# Patient Record
Sex: Female | Born: 1950
Health system: Southern US, Community
[De-identification: ages and names within clinical notes are randomized; demographics above are authoritative.]

## PROBLEM LIST (undated history)

## (undated) DIAGNOSIS — E785 Hyperlipidemia, unspecified: Secondary | ICD-10-CM

## (undated) DIAGNOSIS — D649 Anemia, unspecified: Secondary | ICD-10-CM

## (undated) DIAGNOSIS — E669 Obesity, unspecified: Secondary | ICD-10-CM

## (undated) DIAGNOSIS — D519 Vitamin B12 deficiency anemia, unspecified: Secondary | ICD-10-CM

## (undated) DIAGNOSIS — I639 Cerebral infarction, unspecified: Secondary | ICD-10-CM

## (undated) DIAGNOSIS — F418 Other specified anxiety disorders: Secondary | ICD-10-CM

## (undated) DIAGNOSIS — E0500 Thyrotoxicosis with diffuse goiter without thyrotoxic crisis or storm: Secondary | ICD-10-CM

## (undated) DIAGNOSIS — I10 Essential (primary) hypertension: Secondary | ICD-10-CM

## (undated) DIAGNOSIS — M797 Fibromyalgia: Secondary | ICD-10-CM

## (undated) HISTORY — DX: Fibromyalgia: M79.7

## (undated) HISTORY — DX: Obesity, unspecified: E66.9

## (undated) HISTORY — PX: CHOLECYSTECTOMY: SHX55

## (undated) HISTORY — DX: Vitamin B12 deficiency anemia, unspecified: D51.9

## (undated) HISTORY — DX: Thyrotoxicosis with diffuse goiter without thyrotoxic crisis or storm: E05.00

## (undated) HISTORY — DX: Essential (primary) hypertension: I10

## (undated) HISTORY — DX: Anemia, unspecified: D64.9

## (undated) HISTORY — DX: Hyperlipidemia, unspecified: E78.5

---

## 1979-08-02 HISTORY — PX: GASTRIC BYPASS: SHX52

## 1984-08-01 HISTORY — PX: OTHER SURGICAL HISTORY: SHX169

## 1997-12-25 ENCOUNTER — Encounter: Admission: RE | Admit: 1997-12-25 | Discharge: 1998-03-25 | Payer: Self-pay | Admitting: Family Medicine

## 2000-01-26 ENCOUNTER — Other Ambulatory Visit: Admission: RE | Admit: 2000-01-26 | Discharge: 2000-01-26 | Payer: Self-pay | Admitting: Family Medicine

## 2001-03-28 ENCOUNTER — Inpatient Hospital Stay (HOSPITAL_COMMUNITY): Admission: EM | Admit: 2001-03-28 | Discharge: 2001-03-30 | Payer: Self-pay | Admitting: Emergency Medicine

## 2001-03-28 ENCOUNTER — Encounter: Payer: Self-pay | Admitting: Neurology

## 2001-04-17 ENCOUNTER — Encounter: Admission: RE | Admit: 2001-04-17 | Discharge: 2001-05-01 | Payer: Self-pay | Admitting: Neurology

## 2002-09-09 ENCOUNTER — Encounter: Admission: RE | Admit: 2002-09-09 | Discharge: 2002-12-08 | Payer: Self-pay | Admitting: Family Medicine

## 2003-01-02 ENCOUNTER — Other Ambulatory Visit: Admission: RE | Admit: 2003-01-02 | Discharge: 2003-01-02 | Payer: Self-pay | Admitting: Family Medicine

## 2004-03-10 ENCOUNTER — Encounter (INDEPENDENT_AMBULATORY_CARE_PROVIDER_SITE_OTHER): Payer: Self-pay | Admitting: *Deleted

## 2004-03-10 ENCOUNTER — Ambulatory Visit (HOSPITAL_COMMUNITY): Admission: RE | Admit: 2004-03-10 | Discharge: 2004-03-10 | Payer: Self-pay | Admitting: Gastroenterology

## 2004-04-21 ENCOUNTER — Encounter: Admission: RE | Admit: 2004-04-21 | Discharge: 2004-04-21 | Payer: Self-pay | Admitting: Gastroenterology

## 2004-05-12 ENCOUNTER — Encounter: Admission: RE | Admit: 2004-05-12 | Discharge: 2004-05-12 | Payer: Self-pay | Admitting: Gastroenterology

## 2004-06-02 ENCOUNTER — Encounter (INDEPENDENT_AMBULATORY_CARE_PROVIDER_SITE_OTHER): Payer: Self-pay | Admitting: Specialist

## 2004-06-02 ENCOUNTER — Ambulatory Visit (HOSPITAL_COMMUNITY): Admission: RE | Admit: 2004-06-02 | Discharge: 2004-06-03 | Payer: Self-pay | Admitting: General Surgery

## 2004-06-10 ENCOUNTER — Ambulatory Visit (HOSPITAL_COMMUNITY): Admission: RE | Admit: 2004-06-10 | Discharge: 2004-06-10 | Payer: Self-pay | Admitting: Family Medicine

## 2005-01-12 ENCOUNTER — Ambulatory Visit (HOSPITAL_COMMUNITY): Admission: RE | Admit: 2005-01-12 | Discharge: 2005-01-12 | Payer: Self-pay | Admitting: Gastroenterology

## 2005-01-12 ENCOUNTER — Encounter (INDEPENDENT_AMBULATORY_CARE_PROVIDER_SITE_OTHER): Payer: Self-pay | Admitting: Specialist

## 2005-01-14 ENCOUNTER — Ambulatory Visit (HOSPITAL_COMMUNITY): Admission: RE | Admit: 2005-01-14 | Discharge: 2005-01-14 | Payer: Self-pay | Admitting: Gastroenterology

## 2005-06-01 ENCOUNTER — Encounter: Admission: RE | Admit: 2005-06-01 | Discharge: 2005-06-01 | Payer: Self-pay | Admitting: Orthopedic Surgery

## 2005-08-01 HISTORY — PX: ABDOMINAL HYSTERECTOMY: SHX81

## 2005-08-17 ENCOUNTER — Ambulatory Visit: Payer: Self-pay | Admitting: Oncology

## 2005-10-03 ENCOUNTER — Ambulatory Visit: Payer: Self-pay | Admitting: Oncology

## 2005-11-21 ENCOUNTER — Ambulatory Visit: Payer: Self-pay | Admitting: Oncology

## 2005-11-21 LAB — CBC WITH DIFFERENTIAL/PLATELET
BASO%: 0.3 % (ref 0.0–2.0)
Basophils Absolute: 0 10*3/uL (ref 0.0–0.1)
EOS%: 1 % (ref 0.0–7.0)
Eosinophils Absolute: 0 10*3/uL (ref 0.0–0.5)
HCT: 30 % — ABNORMAL LOW (ref 34.8–46.6)
HGB: 10.1 g/dL — ABNORMAL LOW (ref 11.6–15.9)
LYMPH%: 25 % (ref 14.0–48.0)
MCH: 30.3 pg (ref 26.0–34.0)
MCHC: 33.8 g/dL (ref 32.0–36.0)
MCV: 89.7 fL (ref 81.0–101.0)
MONO#: 0.5 10*3/uL (ref 0.1–0.9)
MONO%: 11 % (ref 0.0–13.0)
NEUT#: 2.7 10*3/uL (ref 1.5–6.5)
NEUT%: 62.7 % (ref 39.6–76.8)
Platelets: 300 10*3/uL (ref 145–400)
RBC: 3.35 10*6/uL — ABNORMAL LOW (ref 3.70–5.32)
RDW: 15.5 % — ABNORMAL HIGH (ref 11.3–14.5)
WBC: 4.2 10*3/uL (ref 3.9–10.0)
lymph#: 1.1 10*3/uL (ref 0.9–3.3)

## 2005-11-24 LAB — HEMOGLOBINOPATHY EVALUATION
Hemoglobin A2.: 3.4 % (ref 1.5–3.7)
Hgb A: 96.6 % (ref 94.3–98.5)
Hgb C: 0 % (ref 0.0–0.0)
Hgb E Quant: 0 % (ref 0.0–0.0)
Hgb F Quant: 0 % (ref 0.0–2.0)
Hgb Other: 0 % (ref 0.0–0.0)
Hgb S Quant: 0 % (ref 0.0–0.0)

## 2005-11-24 LAB — IRON AND TIBC
%SAT: 16 % — ABNORMAL LOW (ref 20–55)
Iron: 44 ug/dL (ref 42–145)
TIBC: 268 ug/dL (ref 250–470)
UIBC: 224 ug/dL

## 2005-11-24 LAB — COMPREHENSIVE METABOLIC PANEL
ALT: 10 U/L (ref 0–40)
AST: 16 U/L (ref 0–37)
Albumin: 3.7 g/dL (ref 3.5–5.2)
Alkaline Phosphatase: 43 U/L (ref 39–117)
BUN: 13 mg/dL (ref 6–23)
CO2: 27 mEq/L (ref 19–32)
Calcium: 8.8 mg/dL (ref 8.4–10.5)
Chloride: 106 mEq/L (ref 96–112)
Creatinine, Ser: 1 mg/dL (ref 0.4–1.2)
Glucose, Bld: 119 mg/dL — ABNORMAL HIGH (ref 70–99)
Potassium: 4.1 mEq/L (ref 3.5–5.3)
Sodium: 136 mEq/L (ref 135–145)
Total Bilirubin: 0.3 mg/dL (ref 0.3–1.2)
Total Protein: 6.7 g/dL (ref 6.0–8.3)

## 2005-11-24 LAB — VITAMIN B12: Vitamin B-12: 265 pg/mL (ref 211–911)

## 2005-11-24 LAB — FERRITIN: Ferritin: 200 ng/mL (ref 10–291)

## 2005-12-28 ENCOUNTER — Ambulatory Visit: Payer: Self-pay | Admitting: Oncology

## 2006-01-03 LAB — CBC WITH DIFFERENTIAL/PLATELET
BASO%: 0.4 % (ref 0.0–2.0)
Basophils Absolute: 0 10*3/uL (ref 0.0–0.1)
EOS%: 1 % (ref 0.0–7.0)
Eosinophils Absolute: 0 10*3/uL (ref 0.0–0.5)
HCT: 29.2 % — ABNORMAL LOW (ref 34.8–46.6)
HGB: 10 g/dL — ABNORMAL LOW (ref 11.6–15.9)
LYMPH%: 24.2 % (ref 14.0–48.0)
MCH: 30.7 pg (ref 26.0–34.0)
MCHC: 34.3 g/dL (ref 32.0–36.0)
MCV: 89.6 fL (ref 81.0–101.0)
MONO#: 0.4 10*3/uL (ref 0.1–0.9)
MONO%: 8.6 % (ref 0.0–13.0)
NEUT#: 2.9 10*3/uL (ref 1.5–6.5)
NEUT%: 65.8 % (ref 39.6–76.8)
Platelets: 306 10*3/uL (ref 145–400)
RBC: 3.26 10*6/uL — ABNORMAL LOW (ref 3.70–5.32)
RDW: 14.2 % (ref 11.3–14.5)
WBC: 4.5 10*3/uL (ref 3.9–10.0)
lymph#: 1.1 10*3/uL (ref 0.9–3.3)

## 2006-01-03 LAB — RETICULOCYTES
IRF: 0.35 — ABNORMAL HIGH (ref 0.130–0.330)
RETIC #: 37 10*3/uL (ref 19.7–115.1)
Retic %: 1.1 % (ref 0.4–2.3)

## 2006-01-03 LAB — CHCC SMEAR

## 2006-01-04 LAB — COMPREHENSIVE METABOLIC PANEL
ALT: 9 U/L (ref 0–40)
AST: 13 U/L (ref 0–37)
Albumin: 3.6 g/dL (ref 3.5–5.2)
Alkaline Phosphatase: 42 U/L (ref 39–117)
BUN: 15 mg/dL (ref 6–23)
CO2: 29 mEq/L (ref 19–32)
Calcium: 9 mg/dL (ref 8.4–10.5)
Chloride: 103 mEq/L (ref 96–112)
Creatinine, Ser: 0.89 mg/dL (ref 0.40–1.20)
Glucose, Bld: 149 mg/dL — ABNORMAL HIGH (ref 70–99)
Potassium: 3.7 mEq/L (ref 3.5–5.3)
Sodium: 137 mEq/L (ref 135–145)
Total Bilirubin: 0.3 mg/dL (ref 0.3–1.2)
Total Protein: 6.6 g/dL (ref 6.0–8.3)

## 2006-01-04 LAB — IRON AND TIBC
%SAT: 13 % — ABNORMAL LOW (ref 20–55)
Iron: 31 ug/dL — ABNORMAL LOW (ref 42–145)
TIBC: 238 ug/dL — ABNORMAL LOW (ref 250–470)
UIBC: 207 ug/dL

## 2006-01-04 LAB — ANA: Anti Nuclear Antibody(ANA): NEGATIVE

## 2006-01-04 LAB — FERRITIN: Ferritin: 187 ng/mL (ref 10–291)

## 2006-01-04 LAB — VITAMIN B12: Vitamin B-12: 322 pg/mL (ref 211–911)

## 2006-01-04 LAB — LACTATE DEHYDROGENASE: LDH: 141 U/L (ref 94–250)

## 2006-01-04 LAB — SEDIMENTATION RATE: Sed Rate: 15 mm/hr (ref 0–22)

## 2006-01-17 LAB — CBC WITH DIFFERENTIAL/PLATELET
BASO%: 0.4 % (ref 0.0–2.0)
Basophils Absolute: 0 10*3/uL (ref 0.0–0.1)
EOS%: 1.7 % (ref 0.0–7.0)
Eosinophils Absolute: 0.1 10*3/uL (ref 0.0–0.5)
HCT: 31.4 % — ABNORMAL LOW (ref 34.8–46.6)
HGB: 10.6 g/dL — ABNORMAL LOW (ref 11.6–15.9)
LYMPH%: 31.3 % (ref 14.0–48.0)
MCH: 30.3 pg (ref 26.0–34.0)
MCHC: 33.7 g/dL (ref 32.0–36.0)
MCV: 89.8 fL (ref 81.0–101.0)
MONO#: 0.3 10*3/uL (ref 0.1–0.9)
MONO%: 11 % (ref 0.0–13.0)
NEUT#: 1.7 10*3/uL (ref 1.5–6.5)
NEUT%: 55.6 % (ref 39.6–76.8)
Platelets: 312 10*3/uL (ref 145–400)
RBC: 3.5 10*6/uL — ABNORMAL LOW (ref 3.70–5.32)
RDW: 15 % — ABNORMAL HIGH (ref 11.3–14.5)
WBC: 3 10*3/uL — ABNORMAL LOW (ref 3.9–10.0)
lymph#: 0.9 10*3/uL (ref 0.9–3.3)

## 2006-01-31 LAB — CBC WITH DIFFERENTIAL/PLATELET
BASO%: 0.3 % (ref 0.0–2.0)
Basophils Absolute: 0 10*3/uL (ref 0.0–0.1)
EOS%: 0.9 % (ref 0.0–7.0)
Eosinophils Absolute: 0 10*3/uL (ref 0.0–0.5)
HCT: 37.8 % (ref 34.8–46.6)
HGB: 12.5 g/dL (ref 11.6–15.9)
LYMPH%: 26.9 % (ref 14.0–48.0)
MCH: 30.2 pg (ref 26.0–34.0)
MCHC: 33 g/dL (ref 32.0–36.0)
MCV: 91.4 fL (ref 81.0–101.0)
MONO#: 0.3 10*3/uL (ref 0.1–0.9)
MONO%: 9.4 % (ref 0.0–13.0)
NEUT#: 2.3 10*3/uL (ref 1.5–6.5)
NEUT%: 62.5 % (ref 39.6–76.8)
Platelets: 346 10*3/uL (ref 145–400)
RBC: 4.13 10*6/uL (ref 3.70–5.32)
RDW: 15.9 % — ABNORMAL HIGH (ref 11.3–14.5)
WBC: 3.6 10*3/uL — ABNORMAL LOW (ref 3.9–10.0)
lymph#: 1 10*3/uL (ref 0.9–3.3)

## 2006-02-13 ENCOUNTER — Ambulatory Visit: Payer: Self-pay | Admitting: Oncology

## 2006-02-13 LAB — CBC WITH DIFFERENTIAL/PLATELET
BASO%: 0.3 % (ref 0.0–2.0)
Basophils Absolute: 0 10*3/uL (ref 0.0–0.1)
EOS%: 1 % (ref 0.0–7.0)
Eosinophils Absolute: 0 10*3/uL (ref 0.0–0.5)
HCT: 34.5 % — ABNORMAL LOW (ref 34.8–46.6)
HGB: 11.8 g/dL (ref 11.6–15.9)
LYMPH%: 25.7 % (ref 14.0–48.0)
MCH: 30.6 pg (ref 26.0–34.0)
MCHC: 34 g/dL (ref 32.0–36.0)
MCV: 90 fL (ref 81.0–101.0)
MONO#: 0.4 10*3/uL (ref 0.1–0.9)
MONO%: 8.3 % (ref 0.0–13.0)
NEUT#: 3.1 10*3/uL (ref 1.5–6.5)
NEUT%: 64.7 % (ref 39.6–76.8)
Platelets: 324 10*3/uL (ref 145–400)
RBC: 3.84 10*6/uL (ref 3.70–5.32)
RDW: 14.6 % — ABNORMAL HIGH (ref 11.3–14.5)
WBC: 4.8 10*3/uL (ref 3.9–10.0)
lymph#: 1.2 10*3/uL (ref 0.9–3.3)

## 2006-02-13 LAB — IRON AND TIBC
%SAT: 32 % (ref 20–55)
Iron: 80 ug/dL (ref 42–145)
TIBC: 247 ug/dL — ABNORMAL LOW (ref 250–470)
UIBC: 167 ug/dL

## 2006-02-13 LAB — FERRITIN: Ferritin: 186 ng/mL (ref 10–291)

## 2006-02-28 LAB — CBC WITH DIFFERENTIAL/PLATELET
BASO%: 0.3 % (ref 0.0–2.0)
Basophils Absolute: 0 10*3/uL (ref 0.0–0.1)
EOS%: 1.1 % (ref 0.0–7.0)
Eosinophils Absolute: 0.1 10*3/uL (ref 0.0–0.5)
HCT: 35.5 % (ref 34.8–46.6)
HGB: 11.9 g/dL (ref 11.6–15.9)
LYMPH%: 16.5 % (ref 14.0–48.0)
MCH: 29.3 pg (ref 26.0–34.0)
MCHC: 33.4 g/dL (ref 32.0–36.0)
MCV: 87.7 fL (ref 81.0–101.0)
MONO#: 0.4 10*3/uL (ref 0.1–0.9)
MONO%: 7.4 % (ref 0.0–13.0)
NEUT#: 3.9 10*3/uL (ref 1.5–6.5)
NEUT%: 74.7 % (ref 39.6–76.8)
Platelets: 459 10*3/uL — ABNORMAL HIGH (ref 145–400)
RBC: 4.05 10*6/uL (ref 3.70–5.32)
RDW: 14.6 % — ABNORMAL HIGH (ref 11.3–14.5)
WBC: 5.2 10*3/uL (ref 3.9–10.0)
lymph#: 0.9 10*3/uL (ref 0.9–3.3)

## 2006-03-13 LAB — CBC WITH DIFFERENTIAL/PLATELET
BASO%: 0.3 % (ref 0.0–2.0)
Basophils Absolute: 0 10*3/uL (ref 0.0–0.1)
EOS%: 1.4 % (ref 0.0–7.0)
Eosinophils Absolute: 0.1 10*3/uL (ref 0.0–0.5)
HCT: 38 % (ref 34.8–46.6)
HGB: 12.5 g/dL (ref 11.6–15.9)
LYMPH%: 20.8 % (ref 14.0–48.0)
MCH: 29.1 pg (ref 26.0–34.0)
MCHC: 33 g/dL (ref 32.0–36.0)
MCV: 88.2 fL (ref 81.0–101.0)
MONO#: 0.3 10*3/uL (ref 0.1–0.9)
MONO%: 7.6 % (ref 0.0–13.0)
NEUT#: 3 10*3/uL (ref 1.5–6.5)
NEUT%: 69.9 % (ref 39.6–76.8)
Platelets: 443 10*3/uL — ABNORMAL HIGH (ref 145–400)
RBC: 4.3 10*6/uL (ref 3.70–5.32)
RDW: 15.7 % — ABNORMAL HIGH (ref 11.3–14.5)
WBC: 4.2 10*3/uL (ref 3.9–10.0)
lymph#: 0.9 10*3/uL (ref 0.9–3.3)

## 2006-03-13 LAB — IRON AND TIBC
%SAT: 18 % — ABNORMAL LOW (ref 20–55)
Iron: 47 ug/dL (ref 42–145)
TIBC: 267 ug/dL (ref 250–470)
UIBC: 220 ug/dL

## 2006-03-13 LAB — FERRITIN: Ferritin: 149 ng/mL (ref 10–291)

## 2006-03-28 LAB — CBC WITH DIFFERENTIAL/PLATELET
BASO%: 0.5 % (ref 0.0–2.0)
Basophils Absolute: 0 10*3/uL (ref 0.0–0.1)
EOS%: 1.5 % (ref 0.0–7.0)
Eosinophils Absolute: 0.1 10*3/uL (ref 0.0–0.5)
HCT: 36.4 % (ref 34.8–46.6)
HGB: 12.2 g/dL (ref 11.6–15.9)
LYMPH%: 23.6 % (ref 14.0–48.0)
MCH: 28.9 pg (ref 26.0–34.0)
MCHC: 33.6 g/dL (ref 32.0–36.0)
MCV: 86.2 fL (ref 81.0–101.0)
MONO#: 0.4 10*3/uL (ref 0.1–0.9)
MONO%: 8.1 % (ref 0.0–13.0)
NEUT#: 3.1 10*3/uL (ref 1.5–6.5)
NEUT%: 66.3 % (ref 39.6–76.8)
Platelets: 288 10*3/uL (ref 145–400)
RBC: 4.22 10*6/uL (ref 3.70–5.32)
RDW: 15.1 % — ABNORMAL HIGH (ref 11.3–14.5)
WBC: 4.6 10*3/uL (ref 3.9–10.0)
lymph#: 1.1 10*3/uL (ref 0.9–3.3)

## 2006-03-28 LAB — IRON AND TIBC
%SAT: 21 % (ref 20–55)
Iron: 59 ug/dL (ref 42–145)
TIBC: 280 ug/dL (ref 250–470)
UIBC: 221 ug/dL

## 2006-03-28 LAB — FERRITIN: Ferritin: 154 ng/mL (ref 10–291)

## 2006-04-20 ENCOUNTER — Ambulatory Visit: Payer: Self-pay | Admitting: Oncology

## 2006-04-24 LAB — CBC WITH DIFFERENTIAL/PLATELET
BASO%: 0.6 % (ref 0.0–2.0)
Basophils Absolute: 0 10*3/uL (ref 0.0–0.1)
EOS%: 2.9 % (ref 0.0–7.0)
Eosinophils Absolute: 0.1 10*3/uL (ref 0.0–0.5)
HCT: 33.9 % — ABNORMAL LOW (ref 34.8–46.6)
HGB: 11.3 g/dL — ABNORMAL LOW (ref 11.6–15.9)
LYMPH%: 35.7 % (ref 14.0–48.0)
MCH: 28.4 pg (ref 26.0–34.0)
MCHC: 33.3 g/dL (ref 32.0–36.0)
MCV: 85.2 fL (ref 81.0–101.0)
MONO#: 0.4 10*3/uL (ref 0.1–0.9)
MONO%: 10 % (ref 0.0–13.0)
NEUT#: 2.2 10*3/uL (ref 1.5–6.5)
NEUT%: 50.8 % (ref 39.6–76.8)
Platelets: 307 10*3/uL (ref 145–400)
RBC: 3.97 10*6/uL (ref 3.70–5.32)
RDW: 15.7 % — ABNORMAL HIGH (ref 11.3–14.5)
WBC: 4.3 10*3/uL (ref 3.9–10.0)
lymph#: 1.5 10*3/uL (ref 0.9–3.3)

## 2006-05-02 ENCOUNTER — Encounter: Admission: RE | Admit: 2006-05-02 | Discharge: 2006-05-02 | Payer: Self-pay | Admitting: Family Medicine

## 2006-06-15 ENCOUNTER — Ambulatory Visit: Payer: Self-pay | Admitting: Oncology

## 2006-06-19 LAB — CBC WITH DIFFERENTIAL/PLATELET
BASO%: 0.7 % (ref 0.0–2.0)
Basophils Absolute: 0 10*3/uL (ref 0.0–0.1)
EOS%: 1.2 % (ref 0.0–7.0)
Eosinophils Absolute: 0 10*3/uL (ref 0.0–0.5)
HCT: 35.3 % (ref 34.8–46.6)
HGB: 11.5 g/dL — ABNORMAL LOW (ref 11.6–15.9)
LYMPH%: 29.6 % (ref 14.0–48.0)
MCH: 28.4 pg (ref 26.0–34.0)
MCHC: 32.6 g/dL (ref 32.0–36.0)
MCV: 87.1 fL (ref 81.0–101.0)
MONO#: 0.4 10*3/uL (ref 0.1–0.9)
MONO%: 11 % (ref 0.0–13.0)
NEUT#: 2.2 10*3/uL (ref 1.5–6.5)
NEUT%: 57.5 % (ref 39.6–76.8)
Platelets: 277 10*3/uL (ref 145–400)
RBC: 4.06 10*6/uL (ref 3.70–5.32)
RDW: 17.2 % — ABNORMAL HIGH (ref 11.3–14.5)
WBC: 3.8 10*3/uL — ABNORMAL LOW (ref 3.9–10.0)
lymph#: 1.1 10*3/uL (ref 0.9–3.3)

## 2006-06-19 LAB — IRON AND TIBC
%SAT: 15 % — ABNORMAL LOW (ref 20–55)
Iron: 44 ug/dL (ref 42–145)
TIBC: 293 ug/dL (ref 250–470)
UIBC: 249 ug/dL

## 2006-06-19 LAB — COMPREHENSIVE METABOLIC PANEL
ALT: <8 U/L (ref 0–35)
AST: 12 U/L (ref 0–37)
Albumin: 4 g/dL (ref 3.5–5.2)
Alkaline Phosphatase: 42 U/L (ref 39–117)
BUN: 15 mg/dL (ref 6–23)
CO2: 26 mEq/L (ref 19–32)
Calcium: 9 mg/dL (ref 8.4–10.5)
Chloride: 105 mEq/L (ref 96–112)
Creatinine, Ser: 0.97 mg/dL (ref 0.40–1.20)
Glucose, Bld: 134 mg/dL — ABNORMAL HIGH (ref 70–99)
Potassium: 4.2 mEq/L (ref 3.5–5.3)
Sodium: 139 mEq/L (ref 135–145)
Total Bilirubin: 0.3 mg/dL (ref 0.3–1.2)
Total Protein: 7.3 g/dL (ref 6.0–8.3)

## 2006-06-19 LAB — LACTATE DEHYDROGENASE: LDH: 145 U/L (ref 94–250)

## 2006-06-19 LAB — FERRITIN: Ferritin: 127 ng/mL (ref 10–291)

## 2006-07-17 LAB — CBC WITH DIFFERENTIAL/PLATELET
BASO%: 0.6 % (ref 0.0–2.0)
Basophils Absolute: 0 10*3/uL (ref 0.0–0.1)
EOS%: 0.8 % (ref 0.0–7.0)
Eosinophils Absolute: 0 10*3/uL (ref 0.0–0.5)
HCT: 38.4 % (ref 34.8–46.6)
HGB: 12.7 g/dL (ref 11.6–15.9)
LYMPH%: 27.2 % (ref 14.0–48.0)
MCH: 28.8 pg (ref 26.0–34.0)
MCHC: 33 g/dL (ref 32.0–36.0)
MCV: 87.1 fL (ref 81.0–101.0)
MONO#: 0.5 10*3/uL (ref 0.1–0.9)
MONO%: 10.4 % (ref 0.0–13.0)
NEUT#: 2.8 10*3/uL (ref 1.5–6.5)
NEUT%: 61 % (ref 39.6–76.8)
Platelets: 300 10*3/uL (ref 145–400)
RBC: 4.41 10*6/uL (ref 3.70–5.32)
RDW: 16.6 % — ABNORMAL HIGH (ref 11.3–14.5)
WBC: 4.6 10*3/uL (ref 3.9–10.0)
lymph#: 1.2 10*3/uL (ref 0.9–3.3)

## 2006-08-09 ENCOUNTER — Ambulatory Visit: Payer: Self-pay | Admitting: Oncology

## 2006-08-14 LAB — CBC WITH DIFFERENTIAL/PLATELET
BASO%: 0.3 % (ref 0.0–2.0)
Basophils Absolute: 0 10*3/uL (ref 0.0–0.1)
EOS%: 1.3 % (ref 0.0–7.0)
Eosinophils Absolute: 0.1 10*3/uL (ref 0.0–0.5)
HCT: 33 % — ABNORMAL LOW (ref 34.8–46.6)
HGB: 11.1 g/dL — ABNORMAL LOW (ref 11.6–15.9)
LYMPH%: 25.7 % (ref 14.0–48.0)
MCH: 28.7 pg (ref 26.0–34.0)
MCHC: 33.6 g/dL (ref 32.0–36.0)
MCV: 85.5 fL (ref 81.0–101.0)
MONO#: 0.5 10*3/uL (ref 0.1–0.9)
MONO%: 9.7 % (ref 0.0–13.0)
NEUT#: 3.3 10*3/uL (ref 1.5–6.5)
NEUT%: 63 % (ref 39.6–76.8)
Platelets: 237 10*3/uL (ref 145–400)
RBC: 3.86 10*6/uL (ref 3.70–5.32)
RDW: 15.5 % — ABNORMAL HIGH (ref 11.3–14.5)
WBC: 5.2 10*3/uL (ref 3.9–10.0)
lymph#: 1.3 10*3/uL (ref 0.9–3.3)

## 2006-09-11 LAB — CBC WITH DIFFERENTIAL/PLATELET
BASO%: 0.5 % (ref 0.0–2.0)
Basophils Absolute: 0 10*3/uL (ref 0.0–0.1)
EOS%: 1.5 % (ref 0.0–7.0)
Eosinophils Absolute: 0.1 10*3/uL (ref 0.0–0.5)
HCT: 33.6 % — ABNORMAL LOW (ref 34.8–46.6)
HGB: 11.4 g/dL — ABNORMAL LOW (ref 11.6–15.9)
LYMPH%: 31.4 % (ref 14.0–48.0)
MCH: 29.2 pg (ref 26.0–34.0)
MCHC: 33.9 g/dL (ref 32.0–36.0)
MCV: 86.1 fL (ref 81.0–101.0)
MONO#: 0.5 10*3/uL (ref 0.1–0.9)
MONO%: 11.5 % (ref 0.0–13.0)
NEUT#: 2.2 10*3/uL (ref 1.5–6.5)
NEUT%: 55.1 % (ref 39.6–76.8)
Platelets: 272 10*3/uL (ref 145–400)
RBC: 3.9 10*6/uL (ref 3.70–5.32)
RDW: 16.8 % — ABNORMAL HIGH (ref 11.3–14.5)
WBC: 4 10*3/uL (ref 3.9–10.0)
lymph#: 1.3 10*3/uL (ref 0.9–3.3)

## 2006-09-19 ENCOUNTER — Ambulatory Visit: Payer: Self-pay | Admitting: Oncology

## 2006-09-21 LAB — COMPREHENSIVE METABOLIC PANEL
ALT: 18 U/L (ref 0–35)
AST: 20 U/L (ref 0–37)
Albumin: 3.8 g/dL (ref 3.5–5.2)
Alkaline Phosphatase: 78 U/L (ref 39–117)
BUN: 10 mg/dL (ref 6–23)
CO2: 29 mEq/L (ref 19–32)
Calcium: 9.4 mg/dL (ref 8.4–10.5)
Chloride: 102 mEq/L (ref 96–112)
Creatinine, Ser: 0.88 mg/dL (ref 0.40–1.20)
Glucose, Bld: 196 mg/dL — ABNORMAL HIGH (ref 70–99)
Potassium: 3.8 mEq/L (ref 3.5–5.3)
Sodium: 140 mEq/L (ref 135–145)
Total Bilirubin: 0.5 mg/dL (ref 0.3–1.2)
Total Protein: 7.2 g/dL (ref 6.0–8.3)

## 2006-09-21 LAB — CBC WITH DIFFERENTIAL/PLATELET
BASO%: 0.4 % (ref 0.0–2.0)
Basophils Absolute: 0 10*3/uL (ref 0.0–0.1)
EOS%: 1.9 % (ref 0.0–7.0)
Eosinophils Absolute: 0.1 10*3/uL (ref 0.0–0.5)
HCT: 37.9 % (ref 34.8–46.6)
HGB: 12.6 g/dL (ref 11.6–15.9)
LYMPH%: 29.7 % (ref 14.0–48.0)
MCH: 28.7 pg (ref 26.0–34.0)
MCHC: 33.3 g/dL (ref 32.0–36.0)
MCV: 86.1 fL (ref 81.0–101.0)
MONO#: 0.4 10*3/uL (ref 0.1–0.9)
MONO%: 8 % (ref 0.0–13.0)
NEUT#: 2.7 10*3/uL (ref 1.5–6.5)
NEUT%: 60 % (ref 39.6–76.8)
Platelets: 336 10*3/uL (ref 145–400)
RBC: 4.41 10*6/uL (ref 3.70–5.32)
RDW: 17.6 % — ABNORMAL HIGH (ref 11.3–14.5)
WBC: 4.5 10*3/uL (ref 3.9–10.0)
lymph#: 1.3 10*3/uL (ref 0.9–3.3)

## 2006-09-21 LAB — FERRITIN: Ferritin: 70 ng/mL (ref 10–291)

## 2006-09-21 LAB — IRON AND TIBC
%SAT: 19 % — ABNORMAL LOW (ref 20–55)
Iron: 51 ug/dL (ref 42–145)
TIBC: 262 ug/dL (ref 250–470)
UIBC: 211 ug/dL

## 2006-09-21 LAB — LACTATE DEHYDROGENASE: LDH: 156 U/L (ref 94–250)

## 2006-09-25 ENCOUNTER — Encounter: Admission: RE | Admit: 2006-09-25 | Discharge: 2006-09-25 | Payer: Self-pay | Admitting: Gastroenterology

## 2006-10-09 LAB — CBC WITH DIFFERENTIAL/PLATELET
BASO%: 0.6 % (ref 0.0–2.0)
Basophils Absolute: 0 10*3/uL (ref 0.0–0.1)
EOS%: 2.3 % (ref 0.0–7.0)
Eosinophils Absolute: 0.1 10*3/uL (ref 0.0–0.5)
HCT: 36.8 % (ref 34.8–46.6)
HGB: 12.4 g/dL (ref 11.6–15.9)
LYMPH%: 33.7 % (ref 14.0–48.0)
MCH: 29.1 pg (ref 26.0–34.0)
MCHC: 33.7 g/dL (ref 32.0–36.0)
MCV: 86.3 fL (ref 81.0–101.0)
MONO#: 0.4 10*3/uL (ref 0.1–0.9)
MONO%: 9.4 % (ref 0.0–13.0)
NEUT#: 2.2 10*3/uL (ref 1.5–6.5)
NEUT%: 54 % (ref 39.6–76.8)
Platelets: 274 10*3/uL (ref 145–400)
RBC: 4.27 10*6/uL (ref 3.70–5.32)
RDW: 16 % — ABNORMAL HIGH (ref 11.3–14.5)
WBC: 4.2 10*3/uL (ref 3.9–10.0)
lymph#: 1.4 10*3/uL (ref 0.9–3.3)

## 2006-11-02 ENCOUNTER — Ambulatory Visit: Payer: Self-pay | Admitting: Oncology

## 2006-11-06 LAB — CBC WITH DIFFERENTIAL/PLATELET
BASO%: 0.6 % (ref 0.0–2.0)
Basophils Absolute: 0 10*3/uL (ref 0.0–0.1)
EOS%: 2.5 % (ref 0.0–7.0)
Eosinophils Absolute: 0.1 10*3/uL (ref 0.0–0.5)
HCT: 33.6 % — ABNORMAL LOW (ref 34.8–46.6)
HGB: 11.3 g/dL — ABNORMAL LOW (ref 11.6–15.9)
LYMPH%: 33.6 % (ref 14.0–48.0)
MCH: 28.8 pg (ref 26.0–34.0)
MCHC: 33.7 g/dL (ref 32.0–36.0)
MCV: 85.5 fL (ref 81.0–101.0)
MONO#: 0.4 10*3/uL (ref 0.1–0.9)
MONO%: 8.5 % (ref 0.0–13.0)
NEUT#: 2.4 10*3/uL (ref 1.5–6.5)
NEUT%: 54.8 % (ref 39.6–76.8)
Platelets: 259 10*3/uL (ref 145–400)
RBC: 3.93 10*6/uL (ref 3.70–5.32)
RDW: 15.4 % — ABNORMAL HIGH (ref 11.3–14.5)
WBC: 4.4 10*3/uL (ref 3.9–10.0)
lymph#: 1.5 10*3/uL (ref 0.9–3.3)

## 2006-11-13 ENCOUNTER — Encounter (INDEPENDENT_AMBULATORY_CARE_PROVIDER_SITE_OTHER): Payer: Self-pay | Admitting: *Deleted

## 2006-11-13 ENCOUNTER — Ambulatory Visit (HOSPITAL_COMMUNITY): Admission: RE | Admit: 2006-11-13 | Discharge: 2006-11-13 | Payer: Self-pay | Admitting: Gastroenterology

## 2006-12-04 LAB — CBC WITH DIFFERENTIAL/PLATELET
BASO%: 0.1 % (ref 0.0–2.0)
Basophils Absolute: 0 10*3/uL (ref 0.0–0.1)
EOS%: 2.3 % (ref 0.0–7.0)
Eosinophils Absolute: 0.1 10*3/uL (ref 0.0–0.5)
HCT: 33.5 % — ABNORMAL LOW (ref 34.8–46.6)
HGB: 11.5 g/dL — ABNORMAL LOW (ref 11.6–15.9)
LYMPH%: 27.3 % (ref 14.0–48.0)
MCH: 29.1 pg (ref 26.0–34.0)
MCHC: 34.3 g/dL (ref 32.0–36.0)
MCV: 84.7 fL (ref 81.0–101.0)
MONO#: 0.4 10*3/uL (ref 0.1–0.9)
MONO%: 10 % (ref 0.0–13.0)
NEUT#: 2.7 10*3/uL (ref 1.5–6.5)
NEUT%: 60.3 % (ref 39.6–76.8)
Platelets: 259 10*3/uL (ref 145–400)
RBC: 3.95 10*6/uL (ref 3.70–5.32)
RDW: 15.5 % — ABNORMAL HIGH (ref 11.3–14.5)
WBC: 4.5 10*3/uL (ref 3.9–10.0)
lymph#: 1.2 10*3/uL (ref 0.9–3.3)

## 2006-12-28 ENCOUNTER — Ambulatory Visit: Payer: Self-pay | Admitting: Oncology

## 2007-01-01 LAB — CBC WITH DIFFERENTIAL/PLATELET
BASO%: 0.7 % (ref 0.0–2.0)
Basophils Absolute: 0 10*3/uL (ref 0.0–0.1)
EOS%: 1.3 % (ref 0.0–7.0)
Eosinophils Absolute: 0.1 10*3/uL (ref 0.0–0.5)
HCT: 35.2 % (ref 34.8–46.6)
HGB: 12 g/dL (ref 11.6–15.9)
LYMPH%: 26.5 % (ref 14.0–48.0)
MCH: 28.7 pg (ref 26.0–34.0)
MCHC: 34.1 g/dL (ref 32.0–36.0)
MCV: 84.1 fL (ref 81.0–101.0)
MONO#: 0.4 10*3/uL (ref 0.1–0.9)
MONO%: 8.6 % (ref 0.0–13.0)
NEUT#: 3.3 10*3/uL (ref 1.5–6.5)
NEUT%: 62.9 % (ref 39.6–76.8)
Platelets: 294 10*3/uL (ref 145–400)
RBC: 4.19 10*6/uL (ref 3.70–5.32)
RDW: 16 % — ABNORMAL HIGH (ref 11.3–14.5)
WBC: 5.2 10*3/uL (ref 3.9–10.0)
lymph#: 1.4 10*3/uL (ref 0.9–3.3)

## 2007-01-23 LAB — COMPREHENSIVE METABOLIC PANEL
ALT: 9 U/L (ref 0–35)
AST: 14 U/L (ref 0–37)
Albumin: 3.6 g/dL (ref 3.5–5.2)
Alkaline Phosphatase: 62 U/L (ref 39–117)
BUN: 16 mg/dL (ref 6–23)
CO2: 26 mEq/L (ref 19–32)
Calcium: 9.4 mg/dL (ref 8.4–10.5)
Chloride: 106 mEq/L (ref 96–112)
Creatinine, Ser: 0.85 mg/dL (ref 0.40–1.20)
Glucose, Bld: 121 mg/dL — ABNORMAL HIGH (ref 70–99)
Potassium: 4 mEq/L (ref 3.5–5.3)
Sodium: 140 mEq/L (ref 135–145)
Total Bilirubin: 0.7 mg/dL (ref 0.3–1.2)
Total Protein: 6.7 g/dL (ref 6.0–8.3)

## 2007-01-23 LAB — CBC WITH DIFFERENTIAL/PLATELET
BASO%: 0.5 % (ref 0.0–2.0)
Basophils Absolute: 0 10*3/uL (ref 0.0–0.1)
EOS%: 1.6 % (ref 0.0–7.0)
Eosinophils Absolute: 0.1 10*3/uL (ref 0.0–0.5)
HCT: 36.2 % (ref 34.8–46.6)
HGB: 12.3 g/dL (ref 11.6–15.9)
LYMPH%: 25.7 % (ref 14.0–48.0)
MCH: 28.8 pg (ref 26.0–34.0)
MCHC: 33.9 g/dL (ref 32.0–36.0)
MCV: 85.1 fL (ref 81.0–101.0)
MONO#: 0.3 10*3/uL (ref 0.1–0.9)
MONO%: 8.3 % (ref 0.0–13.0)
NEUT#: 2.4 10*3/uL (ref 1.5–6.5)
NEUT%: 63.9 % (ref 39.6–76.8)
Platelets: 233 10*3/uL (ref 145–400)
RBC: 4.26 10*6/uL (ref 3.70–5.32)
RDW: 17.3 % — ABNORMAL HIGH (ref 11.3–14.5)
WBC: 3.7 10*3/uL — ABNORMAL LOW (ref 3.9–10.0)
lymph#: 1 10*3/uL (ref 0.9–3.3)

## 2007-01-23 LAB — FERRITIN: Ferritin: 122 ng/mL (ref 10–291)

## 2007-01-23 LAB — LACTATE DEHYDROGENASE: LDH: 137 U/L (ref 94–250)

## 2007-01-23 LAB — IRON AND TIBC
%SAT: 36 % (ref 20–55)
Iron: 93 ug/dL (ref 42–145)
TIBC: 260 ug/dL (ref 250–470)
UIBC: 167 ug/dL

## 2007-01-29 LAB — CBC WITH DIFFERENTIAL/PLATELET
BASO%: 0.3 % (ref 0.0–2.0)
Basophils Absolute: 0 10*3/uL (ref 0.0–0.1)
EOS%: 1.2 % (ref 0.0–7.0)
Eosinophils Absolute: 0.1 10*3/uL (ref 0.0–0.5)
HCT: 38.2 % (ref 34.8–46.6)
HGB: 13 g/dL (ref 11.6–15.9)
LYMPH%: 26.1 % (ref 14.0–48.0)
MCH: 28.9 pg (ref 26.0–34.0)
MCHC: 34 g/dL (ref 32.0–36.0)
MCV: 85 fL (ref 81.0–101.0)
MONO#: 0.5 10*3/uL (ref 0.1–0.9)
MONO%: 8.4 % (ref 0.0–13.0)
NEUT#: 3.8 10*3/uL (ref 1.5–6.5)
NEUT%: 64 % (ref 39.6–76.8)
Platelets: 264 10*3/uL (ref 145–400)
RBC: 4.5 10*6/uL (ref 3.70–5.32)
RDW: 16.8 % — ABNORMAL HIGH (ref 11.3–14.5)
WBC: 5.9 10*3/uL (ref 3.9–10.0)
lymph#: 1.5 10*3/uL (ref 0.9–3.3)

## 2007-02-22 ENCOUNTER — Ambulatory Visit: Payer: Self-pay | Admitting: Oncology

## 2007-02-26 LAB — CBC WITH DIFFERENTIAL/PLATELET
BASO%: 0.2 % (ref 0.0–2.0)
Basophils Absolute: 0 10*3/uL (ref 0.0–0.1)
EOS%: 2.1 % (ref 0.0–7.0)
Eosinophils Absolute: 0.1 10*3/uL (ref 0.0–0.5)
HCT: 32.7 % — ABNORMAL LOW (ref 34.8–46.6)
HGB: 11.1 g/dL — ABNORMAL LOW (ref 11.6–15.9)
LYMPH%: 28.8 % (ref 14.0–48.0)
MCH: 29.1 pg (ref 26.0–34.0)
MCHC: 34.1 g/dL (ref 32.0–36.0)
MCV: 85.4 fL (ref 81.0–101.0)
MONO#: 0.3 10*3/uL (ref 0.1–0.9)
MONO%: 7 % (ref 0.0–13.0)
NEUT#: 2.8 10*3/uL (ref 1.5–6.5)
NEUT%: 61.9 % (ref 39.6–76.8)
Platelets: 203 10*3/uL (ref 145–400)
RBC: 3.83 10*6/uL (ref 3.70–5.32)
RDW: 16.8 % — ABNORMAL HIGH (ref 11.3–14.5)
WBC: 4.5 10*3/uL (ref 3.9–10.0)
lymph#: 1.3 10*3/uL (ref 0.9–3.3)

## 2007-04-23 ENCOUNTER — Ambulatory Visit: Payer: Self-pay | Admitting: Oncology

## 2007-04-25 LAB — CBC WITH DIFFERENTIAL/PLATELET
BASO%: 0.4 % (ref 0.0–2.0)
Basophils Absolute: 0 10*3/uL (ref 0.0–0.1)
EOS%: 2.3 % (ref 0.0–7.0)
Eosinophils Absolute: 0.1 10*3/uL (ref 0.0–0.5)
HCT: 32.4 % — ABNORMAL LOW (ref 34.8–46.6)
HGB: 11.2 g/dL — ABNORMAL LOW (ref 11.6–15.9)
LYMPH%: 31.8 % (ref 14.0–48.0)
MCH: 29.1 pg (ref 26.0–34.0)
MCHC: 34.6 g/dL (ref 32.0–36.0)
MCV: 84.2 fL (ref 81.0–101.0)
MONO#: 0.3 10*3/uL (ref 0.1–0.9)
MONO%: 7.6 % (ref 0.0–13.0)
NEUT#: 2.5 10*3/uL (ref 1.5–6.5)
NEUT%: 57.9 % (ref 39.6–76.8)
Platelets: 261 10*3/uL (ref 145–400)
RBC: 3.85 10*6/uL (ref 3.70–5.32)
RDW: 15 % — ABNORMAL HIGH (ref 11.3–14.5)
WBC: 4.3 10*3/uL (ref 3.9–10.0)
lymph#: 1.4 10*3/uL (ref 0.9–3.3)

## 2007-05-24 LAB — CBC WITH DIFFERENTIAL/PLATELET
BASO%: 0.4 % (ref 0.0–2.0)
Basophils Absolute: 0 10*3/uL (ref 0.0–0.1)
EOS%: 1.9 % (ref 0.0–7.0)
Eosinophils Absolute: 0.1 10*3/uL (ref 0.0–0.5)
HCT: 36.4 % (ref 34.8–46.6)
HGB: 12.4 g/dL (ref 11.6–15.9)
LYMPH%: 29.4 % (ref 14.0–48.0)
MCH: 28.8 pg (ref 26.0–34.0)
MCHC: 34.2 g/dL (ref 32.0–36.0)
MCV: 84.2 fL (ref 81.0–101.0)
MONO#: 0.4 10*3/uL (ref 0.1–0.9)
MONO%: 9.2 % (ref 0.0–13.0)
NEUT#: 2.6 10*3/uL (ref 1.5–6.5)
NEUT%: 59.1 % (ref 39.6–76.8)
Platelets: 289 10*3/uL (ref 145–400)
RBC: 4.32 10*6/uL (ref 3.70–5.32)
RDW: 14.5 % (ref 11.3–14.5)
WBC: 4.3 10*3/uL (ref 3.9–10.0)
lymph#: 1.3 10*3/uL (ref 0.9–3.3)

## 2007-05-24 LAB — COMPREHENSIVE METABOLIC PANEL
ALT: 11 U/L (ref 0–35)
AST: 15 U/L (ref 0–37)
Albumin: 3.9 g/dL (ref 3.5–5.2)
Alkaline Phosphatase: 80 U/L (ref 39–117)
BUN: 12 mg/dL (ref 6–23)
CO2: 26 mEq/L (ref 19–32)
Calcium: 9.2 mg/dL (ref 8.4–10.5)
Chloride: 100 mEq/L (ref 96–112)
Creatinine, Ser: 0.93 mg/dL (ref 0.40–1.20)
Glucose, Bld: 119 mg/dL — ABNORMAL HIGH (ref 70–99)
Potassium: 4 mEq/L (ref 3.5–5.3)
Sodium: 137 mEq/L (ref 135–145)
Total Bilirubin: 0.5 mg/dL (ref 0.3–1.2)
Total Protein: 7.2 g/dL (ref 6.0–8.3)

## 2007-05-24 LAB — IRON AND TIBC
%SAT: 30 % (ref 20–55)
Iron: 74 ug/dL (ref 42–145)
TIBC: 247 ug/dL — ABNORMAL LOW (ref 250–470)
UIBC: 173 ug/dL

## 2007-05-24 LAB — FERRITIN: Ferritin: 126 ng/mL (ref 10–291)

## 2007-05-24 LAB — LACTATE DEHYDROGENASE: LDH: 141 U/L (ref 94–250)

## 2007-06-20 ENCOUNTER — Ambulatory Visit: Payer: Self-pay | Admitting: Oncology

## 2007-06-22 LAB — CBC WITH DIFFERENTIAL/PLATELET
BASO%: 0 % (ref 0.0–2.0)
Basophils Absolute: 0 10*3/uL (ref 0.0–0.1)
EOS%: 0.9 % (ref 0.0–7.0)
Eosinophils Absolute: 0.1 10*3/uL (ref 0.0–0.5)
HCT: 33.7 % — ABNORMAL LOW (ref 34.8–46.6)
HGB: 11.5 g/dL — ABNORMAL LOW (ref 11.6–15.9)
LYMPH%: 16.2 % (ref 14.0–48.0)
MCH: 28.8 pg (ref 26.0–34.0)
MCHC: 34.2 g/dL (ref 32.0–36.0)
MCV: 84.3 fL (ref 81.0–101.0)
MONO#: 0.4 10*3/uL (ref 0.1–0.9)
MONO%: 5 % (ref 0.0–13.0)
NEUT#: 6.7 10*3/uL — ABNORMAL HIGH (ref 1.5–6.5)
NEUT%: 77.9 % — ABNORMAL HIGH (ref 39.6–76.8)
Platelets: 255 10*3/uL (ref 145–400)
RBC: 4 10*6/uL (ref 3.70–5.32)
RDW: 14.5 % (ref 11.3–14.5)
WBC: 8.6 10*3/uL (ref 3.9–10.0)
lymph#: 1.4 10*3/uL (ref 0.9–3.3)

## 2007-07-20 LAB — CBC WITH DIFFERENTIAL/PLATELET
BASO%: 0.2 % (ref 0.0–2.0)
Basophils Absolute: 0 10*3/uL (ref 0.0–0.1)
EOS%: 1.3 % (ref 0.0–7.0)
Eosinophils Absolute: 0.1 10*3/uL (ref 0.0–0.5)
HCT: 35.6 % (ref 34.8–46.6)
HGB: 12 g/dL (ref 11.6–15.9)
LYMPH%: 20.7 % (ref 14.0–48.0)
MCH: 28.4 pg (ref 26.0–34.0)
MCHC: 33.8 g/dL (ref 32.0–36.0)
MCV: 83.9 fL (ref 81.0–101.0)
MONO#: 0.4 10*3/uL (ref 0.1–0.9)
MONO%: 9.9 % (ref 0.0–13.0)
NEUT#: 2.9 10*3/uL (ref 1.5–6.5)
NEUT%: 67.9 % (ref 39.6–76.8)
Platelets: 307 10*3/uL (ref 145–400)
RBC: 4.24 10*6/uL (ref 3.70–5.32)
RDW: 15.4 % — ABNORMAL HIGH (ref 11.3–14.5)
WBC: 4.3 10*3/uL (ref 3.9–10.0)
lymph#: 0.9 10*3/uL (ref 0.9–3.3)

## 2007-08-15 ENCOUNTER — Ambulatory Visit: Payer: Self-pay | Admitting: Oncology

## 2007-08-17 LAB — CBC WITH DIFFERENTIAL/PLATELET
BASO%: 0.4 % (ref 0.0–2.0)
Basophils Absolute: 0 10*3/uL (ref 0.0–0.1)
EOS%: 4.1 % (ref 0.0–7.0)
Eosinophils Absolute: 0.2 10*3/uL (ref 0.0–0.5)
HCT: 35.6 % (ref 34.8–46.6)
HGB: 12 g/dL (ref 11.6–15.9)
LYMPH%: 26 % (ref 14.0–48.0)
MCH: 28.1 pg (ref 26.0–34.0)
MCHC: 33.6 g/dL (ref 32.0–36.0)
MCV: 83.9 fL (ref 81.0–101.0)
MONO#: 0.4 10*3/uL (ref 0.1–0.9)
MONO%: 7.7 % (ref 0.0–13.0)
NEUT#: 3.5 10*3/uL (ref 1.5–6.5)
NEUT%: 61.8 % (ref 39.6–76.8)
Platelets: 321 10*3/uL (ref 145–400)
RBC: 4.25 10*6/uL (ref 3.70–5.32)
RDW: 15.4 % — ABNORMAL HIGH (ref 11.3–14.5)
WBC: 5.6 10*3/uL (ref 3.9–10.0)
lymph#: 1.5 10*3/uL (ref 0.9–3.3)

## 2007-09-20 LAB — CBC WITH DIFFERENTIAL/PLATELET
BASO%: 0.7 % (ref 0.0–2.0)
Basophils Absolute: 0 10*3/uL (ref 0.0–0.1)
EOS%: 1.8 % (ref 0.0–7.0)
Eosinophils Absolute: 0.1 10*3/uL (ref 0.0–0.5)
HCT: 33.8 % — ABNORMAL LOW (ref 34.8–46.6)
HGB: 11.6 g/dL (ref 11.6–15.9)
LYMPH%: 30.5 % (ref 14.0–48.0)
MCH: 28.6 pg (ref 26.0–34.0)
MCHC: 34.4 g/dL (ref 32.0–36.0)
MCV: 83.3 fL (ref 81.0–101.0)
MONO#: 0.4 10*3/uL (ref 0.1–0.9)
MONO%: 8.4 % (ref 0.0–13.0)
NEUT#: 3.1 10*3/uL (ref 1.5–6.5)
NEUT%: 58.6 % (ref 39.6–76.8)
Platelets: 269 10*3/uL (ref 145–400)
RBC: 4.05 10*6/uL (ref 3.70–5.32)
RDW: 15.5 % — ABNORMAL HIGH (ref 11.3–14.5)
WBC: 5.3 10*3/uL (ref 3.9–10.0)
lymph#: 1.6 10*3/uL (ref 0.9–3.3)

## 2007-10-10 ENCOUNTER — Ambulatory Visit: Payer: Self-pay | Admitting: Oncology

## 2007-10-12 LAB — CBC WITH DIFFERENTIAL/PLATELET
BASO%: 0.6 % (ref 0.0–2.0)
Basophils Absolute: 0 10*3/uL (ref 0.0–0.1)
EOS%: 1.6 % (ref 0.0–7.0)
Eosinophils Absolute: 0.1 10*3/uL (ref 0.0–0.5)
HCT: 33.8 % — ABNORMAL LOW (ref 34.8–46.6)
HGB: 11.5 g/dL — ABNORMAL LOW (ref 11.6–15.9)
LYMPH%: 32.2 % (ref 14.0–48.0)
MCH: 28.3 pg (ref 26.0–34.0)
MCHC: 33.9 g/dL (ref 32.0–36.0)
MCV: 83.6 fL (ref 81.0–101.0)
MONO#: 0.4 10*3/uL (ref 0.1–0.9)
MONO%: 8.4 % (ref 0.0–13.0)
NEUT#: 2.9 10*3/uL (ref 1.5–6.5)
NEUT%: 57.2 % (ref 39.6–76.8)
Platelets: 283 10*3/uL (ref 145–400)
RBC: 4.05 10*6/uL (ref 3.70–5.32)
RDW: 16.1 % — ABNORMAL HIGH (ref 11.3–14.5)
WBC: 5.1 10*3/uL (ref 3.9–10.0)
lymph#: 1.6 10*3/uL (ref 0.9–3.3)

## 2007-11-09 LAB — IRON AND TIBC
%SAT: 7 % — ABNORMAL LOW (ref 20–55)
Iron: 15 ug/dL — ABNORMAL LOW (ref 42–145)
TIBC: 217 ug/dL — ABNORMAL LOW (ref 250–470)
UIBC: 202 ug/dL

## 2007-11-09 LAB — CBC WITH DIFFERENTIAL/PLATELET
BASO%: 0.4 % (ref 0.0–2.0)
Basophils Absolute: 0 10*3/uL (ref 0.0–0.1)
EOS%: 0.9 % (ref 0.0–7.0)
Eosinophils Absolute: 0.1 10*3/uL (ref 0.0–0.5)
HCT: 32 % — ABNORMAL LOW (ref 34.8–46.6)
HGB: 10.9 g/dL — ABNORMAL LOW (ref 11.6–15.9)
LYMPH%: 11 % — ABNORMAL LOW (ref 14.0–48.0)
MCH: 28.3 pg (ref 26.0–34.0)
MCHC: 34.1 g/dL (ref 32.0–36.0)
MCV: 82.9 fL (ref 81.0–101.0)
MONO#: 0.6 10*3/uL (ref 0.1–0.9)
MONO%: 6.4 % (ref 0.0–13.0)
NEUT#: 7 10*3/uL — ABNORMAL HIGH (ref 1.5–6.5)
NEUT%: 81.3 % — ABNORMAL HIGH (ref 39.6–76.8)
Platelets: 280 10*3/uL (ref 145–400)
RBC: 3.85 10*6/uL (ref 3.70–5.32)
RDW: 15.4 % — ABNORMAL HIGH (ref 11.3–14.5)
WBC: 8.6 10*3/uL (ref 3.9–10.0)
lymph#: 0.9 10*3/uL (ref 0.9–3.3)

## 2007-11-09 LAB — COMPREHENSIVE METABOLIC PANEL
ALT: 43 U/L — ABNORMAL HIGH (ref 0–35)
AST: 31 U/L (ref 0–37)
Albumin: 3.7 g/dL (ref 3.5–5.2)
Alkaline Phosphatase: 101 U/L (ref 39–117)
BUN: 12 mg/dL (ref 6–23)
CO2: 26 mEq/L (ref 19–32)
Calcium: 9 mg/dL (ref 8.4–10.5)
Chloride: 102 mEq/L (ref 96–112)
Creatinine, Ser: 0.99 mg/dL (ref 0.40–1.20)
Glucose, Bld: 161 mg/dL — ABNORMAL HIGH (ref 70–99)
Potassium: 3.7 mEq/L (ref 3.5–5.3)
Sodium: 140 mEq/L (ref 135–145)
Total Bilirubin: 0.5 mg/dL (ref 0.3–1.2)
Total Protein: 7.2 g/dL (ref 6.0–8.3)

## 2007-11-09 LAB — LACTATE DEHYDROGENASE: LDH: 176 U/L (ref 94–250)

## 2007-11-09 LAB — FERRITIN: Ferritin: 162 ng/mL (ref 10–291)

## 2007-12-05 ENCOUNTER — Ambulatory Visit: Payer: Self-pay | Admitting: Oncology

## 2007-12-07 LAB — CBC WITH DIFFERENTIAL/PLATELET
BASO%: 0.6 % (ref 0.0–2.0)
Basophils Absolute: 0 10*3/uL (ref 0.0–0.1)
EOS%: 2.3 % (ref 0.0–7.0)
Eosinophils Absolute: 0.1 10*3/uL (ref 0.0–0.5)
HCT: 31.3 % — ABNORMAL LOW (ref 34.8–46.6)
HGB: 10.6 g/dL — ABNORMAL LOW (ref 11.6–15.9)
LYMPH%: 29.6 % (ref 14.0–48.0)
MCH: 28.6 pg (ref 26.0–34.0)
MCHC: 34 g/dL (ref 32.0–36.0)
MCV: 84.1 fL (ref 81.0–101.0)
MONO#: 0.4 10*3/uL (ref 0.1–0.9)
MONO%: 9 % (ref 0.0–13.0)
NEUT#: 2.8 10*3/uL (ref 1.5–6.5)
NEUT%: 58.5 % (ref 39.6–76.8)
Platelets: 248 10*3/uL (ref 145–400)
RBC: 3.72 10*6/uL (ref 3.70–5.32)
RDW: 14.8 % — ABNORMAL HIGH (ref 11.3–14.5)
WBC: 4.7 10*3/uL (ref 3.9–10.0)
lymph#: 1.4 10*3/uL (ref 0.9–3.3)

## 2008-01-04 LAB — CBC WITH DIFFERENTIAL/PLATELET
BASO%: 0.7 % (ref 0.0–2.0)
Basophils Absolute: 0 10*3/uL (ref 0.0–0.1)
EOS%: 1.6 % (ref 0.0–7.0)
Eosinophils Absolute: 0.1 10*3/uL (ref 0.0–0.5)
HCT: 35.6 % (ref 34.8–46.6)
HGB: 12.1 g/dL (ref 11.6–15.9)
LYMPH%: 29.6 % (ref 14.0–48.0)
MCH: 28.3 pg (ref 26.0–34.0)
MCHC: 33.9 g/dL (ref 32.0–36.0)
MCV: 83.6 fL (ref 81.0–101.0)
MONO#: 0.4 10*3/uL (ref 0.1–0.9)
MONO%: 9.2 % (ref 0.0–13.0)
NEUT#: 2.7 10*3/uL (ref 1.5–6.5)
NEUT%: 58.9 % (ref 39.6–76.8)
Platelets: 264 10*3/uL (ref 145–400)
RBC: 4.27 10*6/uL (ref 3.70–5.32)
RDW: 14.4 % (ref 11.3–14.5)
WBC: 4.6 10*3/uL (ref 3.9–10.0)
lymph#: 1.3 10*3/uL (ref 0.9–3.3)

## 2008-01-29 ENCOUNTER — Ambulatory Visit: Payer: Self-pay | Admitting: Oncology

## 2008-01-31 LAB — CBC WITH DIFFERENTIAL/PLATELET
BASO%: 1 % (ref 0.0–2.0)
Basophils Absolute: 0 10*3/uL (ref 0.0–0.1)
EOS%: 3.5 % (ref 0.0–7.0)
Eosinophils Absolute: 0.2 10*3/uL (ref 0.0–0.5)
HCT: 34.5 % — ABNORMAL LOW (ref 34.8–46.6)
HGB: 11.8 g/dL (ref 11.6–15.9)
LYMPH%: 33.4 % (ref 14.0–48.0)
MCH: 28 pg (ref 26.0–34.0)
MCHC: 34.3 g/dL (ref 32.0–36.0)
MCV: 81.6 fL (ref 81.0–101.0)
MONO#: 0.4 10*3/uL (ref 0.1–0.9)
MONO%: 8 % (ref 0.0–13.0)
NEUT#: 2.7 10*3/uL (ref 1.5–6.5)
NEUT%: 54.1 % (ref 39.6–76.8)
Platelets: 234 10*3/uL (ref 145–400)
RBC: 4.23 10*6/uL (ref 3.70–5.32)
RDW: 13.3 % (ref 11.3–14.5)
WBC: 5.1 10*3/uL (ref 3.9–10.0)
lymph#: 1.7 10*3/uL (ref 0.9–3.3)

## 2008-02-29 LAB — CBC WITH DIFFERENTIAL/PLATELET
BASO%: 0.5 % (ref 0.0–2.0)
Basophils Absolute: 0 10*3/uL (ref 0.0–0.1)
EOS%: 1.7 % (ref 0.0–7.0)
Eosinophils Absolute: 0.1 10*3/uL (ref 0.0–0.5)
HCT: 37.2 % (ref 34.8–46.6)
HGB: 12.4 g/dL (ref 11.6–15.9)
LYMPH%: 32.7 % (ref 14.0–48.0)
MCH: 27.8 pg (ref 26.0–34.0)
MCHC: 33.5 g/dL (ref 32.0–36.0)
MCV: 83 fL (ref 81.0–101.0)
MONO#: 0.4 10*3/uL (ref 0.1–0.9)
MONO%: 8.9 % (ref 0.0–13.0)
NEUT#: 2.4 10*3/uL (ref 1.5–6.5)
NEUT%: 56.2 % (ref 39.6–76.8)
Platelets: 275 10*3/uL (ref 145–400)
RBC: 4.48 10*6/uL (ref 3.70–5.32)
RDW: 15.6 % — ABNORMAL HIGH (ref 11.3–14.5)
WBC: 4.3 10*3/uL (ref 3.9–10.0)
lymph#: 1.4 10*3/uL (ref 0.9–3.3)

## 2008-03-26 ENCOUNTER — Ambulatory Visit: Payer: Self-pay | Admitting: Oncology

## 2008-03-28 LAB — CBC WITH DIFFERENTIAL/PLATELET
BASO%: 0.5 % (ref 0.0–2.0)
Basophils Absolute: 0 10*3/uL (ref 0.0–0.1)
EOS%: 2.8 % (ref 0.0–7.0)
Eosinophils Absolute: 0.2 10*3/uL (ref 0.0–0.5)
HCT: 34.9 % (ref 34.8–46.6)
HGB: 11.8 g/dL (ref 11.6–15.9)
LYMPH%: 31.6 % (ref 14.0–48.0)
MCH: 27.9 pg (ref 26.0–34.0)
MCHC: 33.9 g/dL (ref 32.0–36.0)
MCV: 82.3 fL (ref 81.0–101.0)
MONO#: 0.5 10*3/uL (ref 0.1–0.9)
MONO%: 9.4 % (ref 0.0–13.0)
NEUT#: 3.2 10*3/uL (ref 1.5–6.5)
NEUT%: 55.7 % (ref 39.6–76.8)
Platelets: 275 10*3/uL (ref 145–400)
RBC: 4.23 10*6/uL (ref 3.70–5.32)
RDW: 16.1 % — ABNORMAL HIGH (ref 11.3–14.5)
WBC: 5.7 10*3/uL (ref 3.9–10.0)
lymph#: 1.8 10*3/uL (ref 0.9–3.3)

## 2008-05-01 LAB — CBC WITH DIFFERENTIAL/PLATELET
BASO%: 0.6 % (ref 0.0–2.0)
Basophils Absolute: 0 10*3/uL (ref 0.0–0.1)
EOS%: 2.5 % (ref 0.0–7.0)
Eosinophils Absolute: 0.1 10*3/uL (ref 0.0–0.5)
HCT: 35.5 % (ref 34.8–46.6)
HGB: 11.8 g/dL (ref 11.6–15.9)
LYMPH%: 31.6 % (ref 14.0–48.0)
MCH: 28.3 pg (ref 26.0–34.0)
MCHC: 33.3 g/dL (ref 32.0–36.0)
MCV: 85.1 fL (ref 81.0–101.0)
MONO#: 0.5 10*3/uL (ref 0.1–0.9)
MONO%: 9.3 % (ref 0.0–13.0)
NEUT#: 2.7 10*3/uL (ref 1.5–6.5)
NEUT%: 56 % (ref 39.6–76.8)
Platelets: 282 10*3/uL (ref 145–400)
RBC: 4.17 10*6/uL (ref 3.70–5.32)
RDW: 16.3 % — ABNORMAL HIGH (ref 11.3–14.5)
WBC: 4.9 10*3/uL (ref 3.9–10.0)
lymph#: 1.5 10*3/uL (ref 0.9–3.3)

## 2008-05-21 ENCOUNTER — Ambulatory Visit: Payer: Self-pay | Admitting: Oncology

## 2008-05-23 LAB — COMPREHENSIVE METABOLIC PANEL
ALT: 20 U/L (ref 0–35)
AST: 17 U/L (ref 0–37)
Albumin: 3.8 g/dL (ref 3.5–5.2)
Alkaline Phosphatase: 89 U/L (ref 39–117)
BUN: 13 mg/dL (ref 6–23)
CO2: 25 mEq/L (ref 19–32)
Calcium: 8.9 mg/dL (ref 8.4–10.5)
Chloride: 104 mEq/L (ref 96–112)
Creatinine, Ser: 0.96 mg/dL (ref 0.40–1.20)
Glucose, Bld: 331 mg/dL — ABNORMAL HIGH (ref 70–99)
Potassium: 3.8 mEq/L (ref 3.5–5.3)
Sodium: 139 mEq/L (ref 135–145)
Total Bilirubin: 0.4 mg/dL (ref 0.3–1.2)
Total Protein: 6.5 g/dL (ref 6.0–8.3)

## 2008-05-23 LAB — CBC WITH DIFFERENTIAL/PLATELET
BASO%: 0.5 % (ref 0.0–2.0)
Basophils Absolute: 0 10*3/uL (ref 0.0–0.1)
EOS%: 2 % (ref 0.0–7.0)
Eosinophils Absolute: 0.1 10*3/uL (ref 0.0–0.5)
HCT: 36.3 % (ref 34.8–46.6)
HGB: 12.1 g/dL (ref 11.6–15.9)
LYMPH%: 29.7 % (ref 14.0–48.0)
MCH: 28.3 pg (ref 26.0–34.0)
MCHC: 33.4 g/dL (ref 32.0–36.0)
MCV: 84.9 fL (ref 81.0–101.0)
MONO#: 0.4 10*3/uL (ref 0.1–0.9)
MONO%: 8 % (ref 0.0–13.0)
NEUT#: 2.7 10*3/uL (ref 1.5–6.5)
NEUT%: 59.8 % (ref 39.6–76.8)
Platelets: 238 10*3/uL (ref 145–400)
RBC: 4.27 10*6/uL (ref 3.70–5.32)
RDW: 15.6 % — ABNORMAL HIGH (ref 11.3–14.5)
WBC: 4.5 10*3/uL (ref 3.9–10.0)
lymph#: 1.3 10*3/uL (ref 0.9–3.3)

## 2008-05-23 LAB — IRON AND TIBC
%SAT: 21 % (ref 20–55)
Iron: 52 ug/dL (ref 42–145)
TIBC: 252 ug/dL (ref 250–470)
UIBC: 200 ug/dL

## 2008-05-23 LAB — LACTATE DEHYDROGENASE: LDH: 144 U/L (ref 94–250)

## 2008-05-23 LAB — FERRITIN: Ferritin: 34 ng/mL (ref 10–291)

## 2008-06-12 LAB — CBC WITH DIFFERENTIAL/PLATELET
BASO%: 0.6 % (ref 0.0–2.0)
Basophils Absolute: 0 10*3/uL (ref 0.0–0.1)
EOS%: 1.7 % (ref 0.0–7.0)
Eosinophils Absolute: 0.1 10*3/uL (ref 0.0–0.5)
HCT: 36.4 % (ref 34.8–46.6)
HGB: 12.1 g/dL (ref 11.6–15.9)
LYMPH%: 26 % (ref 14.0–48.0)
MCH: 28.1 pg (ref 26.0–34.0)
MCHC: 33.3 g/dL (ref 32.0–36.0)
MCV: 84.4 fL (ref 81.0–101.0)
MONO#: 0.4 10*3/uL (ref 0.1–0.9)
MONO%: 7.8 % (ref 0.0–13.0)
NEUT#: 3 10*3/uL (ref 1.5–6.5)
NEUT%: 63.9 % (ref 39.6–76.8)
Platelets: 263 10*3/uL (ref 145–400)
RBC: 4.31 10*6/uL (ref 3.70–5.32)
RDW: 15 % — ABNORMAL HIGH (ref 11.3–14.5)
WBC: 4.7 10*3/uL (ref 3.9–10.0)
lymph#: 1.2 10*3/uL (ref 0.9–3.3)

## 2008-06-12 LAB — FECAL OCCULT BLOOD, GUAIAC: Occult Blood: NEGATIVE

## 2008-06-14 LAB — IRON AND TIBC
%SAT: 21 % (ref 20–55)
Iron: 53 ug/dL (ref 42–145)
TIBC: 255 ug/dL (ref 250–470)
UIBC: 202 ug/dL

## 2008-06-14 LAB — FERRITIN: Ferritin: 48 ng/mL (ref 10–291)

## 2008-06-14 LAB — TRANSFERRIN RECEPTOR, SOLUABLE: Transferrin Receptor, Soluble: 20.1 nmol/L

## 2008-08-12 ENCOUNTER — Ambulatory Visit: Payer: Self-pay | Admitting: Oncology

## 2008-08-14 LAB — CBC WITH DIFFERENTIAL/PLATELET
BASO%: 0.5 % (ref 0.0–2.0)
Basophils Absolute: 0 10*3/uL (ref 0.0–0.1)
EOS%: 2.2 % (ref 0.0–7.0)
Eosinophils Absolute: 0.1 10*3/uL (ref 0.0–0.5)
HCT: 34 % — ABNORMAL LOW (ref 34.8–46.6)
HGB: 11.3 g/dL — ABNORMAL LOW (ref 11.6–15.9)
LYMPH%: 25 % (ref 14.0–48.0)
MCH: 28.1 pg (ref 26.0–34.0)
MCHC: 33.3 g/dL (ref 32.0–36.0)
MCV: 84.3 fL (ref 81.0–101.0)
MONO#: 0.4 10*3/uL (ref 0.1–0.9)
MONO%: 7.1 % (ref 0.0–13.0)
NEUT#: 3.5 10*3/uL (ref 1.5–6.5)
NEUT%: 65.2 % (ref 39.6–76.8)
Platelets: 277 10*3/uL (ref 145–400)
RBC: 4.04 10*6/uL (ref 3.70–5.32)
RDW: 15.3 % — ABNORMAL HIGH (ref 11.3–14.5)
WBC: 5.4 10*3/uL (ref 3.9–10.0)
lymph#: 1.4 10*3/uL (ref 0.9–3.3)

## 2008-08-16 LAB — IRON AND TIBC
%SAT: 22 % (ref 20–55)
Iron: 58 ug/dL (ref 42–145)
TIBC: 263 ug/dL (ref 250–470)
UIBC: 205 ug/dL

## 2008-08-16 LAB — TRANSFERRIN RECEPTOR, SOLUABLE: Transferrin Receptor, Soluble: 17.1 nmol/L

## 2008-08-16 LAB — FERRITIN: Ferritin: 53 ng/mL (ref 10–291)

## 2008-10-21 ENCOUNTER — Ambulatory Visit: Payer: Self-pay | Admitting: Oncology

## 2008-10-23 LAB — LACTATE DEHYDROGENASE: LDH: 126 U/L (ref 94–250)

## 2008-10-23 LAB — COMPREHENSIVE METABOLIC PANEL
ALT: 17 U/L (ref 0–35)
AST: 20 U/L (ref 0–37)
Albumin: 3.4 g/dL — ABNORMAL LOW (ref 3.5–5.2)
Alkaline Phosphatase: 84 U/L (ref 39–117)
BUN: 11 mg/dL (ref 6–23)
CO2: 26 mEq/L (ref 19–32)
Calcium: 8.8 mg/dL (ref 8.4–10.5)
Chloride: 101 mEq/L (ref 96–112)
Creatinine, Ser: 0.82 mg/dL (ref 0.40–1.20)
Glucose, Bld: 108 mg/dL — ABNORMAL HIGH (ref 70–99)
Potassium: 3.7 mEq/L (ref 3.5–5.3)
Sodium: 136 mEq/L (ref 135–145)
Total Bilirubin: 0.4 mg/dL (ref 0.3–1.2)
Total Protein: 7.1 g/dL (ref 6.0–8.3)

## 2008-10-23 LAB — CBC WITH DIFFERENTIAL/PLATELET
BASO%: 0.5 % (ref 0.0–2.0)
Basophils Absolute: 0 10*3/uL (ref 0.0–0.1)
EOS%: 1 % (ref 0.0–7.0)
Eosinophils Absolute: 0 10*3/uL (ref 0.0–0.5)
HCT: 32.8 % — ABNORMAL LOW (ref 34.8–46.6)
HGB: 11.2 g/dL — ABNORMAL LOW (ref 11.6–15.9)
LYMPH%: 33.4 % (ref 14.0–49.7)
MCH: 29.2 pg (ref 25.1–34.0)
MCHC: 34 g/dL (ref 31.5–36.0)
MCV: 85.7 fL (ref 79.5–101.0)
MONO#: 0.4 10*3/uL (ref 0.1–0.9)
MONO%: 8.3 % (ref 0.0–14.0)
NEUT#: 2.7 10*3/uL (ref 1.5–6.5)
NEUT%: 56.8 % (ref 38.4–76.8)
Platelets: 300 10*3/uL (ref 145–400)
RBC: 3.83 10*6/uL (ref 3.70–5.45)
RDW: 14.2 % (ref 11.2–14.5)
WBC: 4.8 10*3/uL (ref 3.9–10.3)
lymph#: 1.6 10*3/uL (ref 0.9–3.3)

## 2008-10-25 LAB — IRON AND TIBC
%SAT: 17 % — ABNORMAL LOW (ref 20–55)
Iron: 47 ug/dL (ref 42–145)
TIBC: 271 ug/dL (ref 250–470)
UIBC: 224 ug/dL

## 2008-10-25 LAB — VITAMIN B12: Vitamin B-12: 321 pg/mL (ref 211–911)

## 2008-10-25 LAB — TRANSFERRIN RECEPTOR, SOLUABLE: Transferrin Receptor, Soluble: 17.6 nmol/L

## 2008-10-25 LAB — FERRITIN: Ferritin: 37 ng/mL (ref 10–291)

## 2008-12-03 LAB — IRON AND TIBC
%SAT: 20 % (ref 20–55)
Iron: 55 ug/dL (ref 42–145)
TIBC: 272 ug/dL (ref 250–470)
UIBC: 217 ug/dL

## 2008-12-03 LAB — CBC WITH DIFFERENTIAL/PLATELET
BASO%: 0.6 % (ref 0.0–2.0)
Basophils Absolute: 0 10*3/uL (ref 0.0–0.1)
EOS%: 2 % (ref 0.0–7.0)
Eosinophils Absolute: 0.1 10*3/uL (ref 0.0–0.5)
HCT: 34.2 % — ABNORMAL LOW (ref 34.8–46.6)
HGB: 11.4 g/dL — ABNORMAL LOW (ref 11.6–15.9)
LYMPH%: 28.8 % (ref 14.0–49.7)
MCH: 28.1 pg (ref 25.1–34.0)
MCHC: 33.5 g/dL (ref 31.5–36.0)
MCV: 84 fL (ref 79.5–101.0)
MONO#: 0.5 10*3/uL (ref 0.1–0.9)
MONO%: 9.3 % (ref 0.0–14.0)
NEUT#: 3.1 10*3/uL (ref 1.5–6.5)
NEUT%: 59.3 % (ref 38.4–76.8)
Platelets: 290 10*3/uL (ref 145–400)
RBC: 4.08 10*6/uL (ref 3.70–5.45)
RDW: 14.1 % (ref 11.2–14.5)
WBC: 5.2 10*3/uL (ref 3.9–10.3)
lymph#: 1.5 10*3/uL (ref 0.9–3.3)

## 2008-12-03 LAB — FERRITIN: Ferritin: 24 ng/mL (ref 10–291)

## 2008-12-23 ENCOUNTER — Ambulatory Visit: Payer: Self-pay | Admitting: Oncology

## 2008-12-25 LAB — CBC WITH DIFFERENTIAL/PLATELET
BASO%: 0.7 % (ref 0.0–2.0)
Basophils Absolute: 0 10*3/uL (ref 0.0–0.1)
EOS%: 1.3 % (ref 0.0–7.0)
Eosinophils Absolute: 0.1 10*3/uL (ref 0.0–0.5)
HCT: 35.7 % (ref 34.8–46.6)
HGB: 11.7 g/dL (ref 11.6–15.9)
LYMPH%: 29.6 % (ref 14.0–49.7)
MCH: 27.6 pg (ref 25.1–34.0)
MCHC: 32.8 g/dL (ref 31.5–36.0)
MCV: 84 fL (ref 79.5–101.0)
MONO#: 0.4 10*3/uL (ref 0.1–0.9)
MONO%: 8.1 % (ref 0.0–14.0)
NEUT#: 3.3 10*3/uL (ref 1.5–6.5)
NEUT%: 60.3 % (ref 38.4–76.8)
Platelets: 273 10*3/uL (ref 145–400)
RBC: 4.25 10*6/uL (ref 3.70–5.45)
RDW: 14.6 % — ABNORMAL HIGH (ref 11.2–14.5)
WBC: 5.4 10*3/uL (ref 3.9–10.3)
lymph#: 1.6 10*3/uL (ref 0.9–3.3)

## 2008-12-25 LAB — FERRITIN: Ferritin: 18 ng/mL (ref 10–291)

## 2009-01-20 LAB — CBC WITH DIFFERENTIAL/PLATELET
BASO%: 0.4 % (ref 0.0–2.0)
Basophils Absolute: 0 10*3/uL (ref 0.0–0.1)
EOS%: 1.7 % (ref 0.0–7.0)
Eosinophils Absolute: 0.1 10*3/uL (ref 0.0–0.5)
HCT: 32 % — ABNORMAL LOW (ref 34.8–46.6)
HGB: 11.2 g/dL — ABNORMAL LOW (ref 11.6–15.9)
LYMPH%: 26.8 % (ref 14.0–49.7)
MCH: 28.8 pg (ref 25.1–34.0)
MCHC: 34.9 g/dL (ref 31.5–36.0)
MCV: 82.5 fL (ref 79.5–101.0)
MONO#: 0.5 10*3/uL (ref 0.1–0.9)
MONO%: 7.3 % (ref 0.0–14.0)
NEUT#: 4 10*3/uL (ref 1.5–6.5)
NEUT%: 63.8 % (ref 38.4–76.8)
Platelets: 235 10*3/uL (ref 145–400)
RBC: 3.88 10*6/uL (ref 3.70–5.45)
RDW: 16.1 % — ABNORMAL HIGH (ref 11.2–14.5)
WBC: 6.3 10*3/uL (ref 3.9–10.3)
lymph#: 1.7 10*3/uL (ref 0.9–3.3)

## 2009-01-22 LAB — COMPREHENSIVE METABOLIC PANEL
ALT: 12 U/L (ref 0–35)
AST: 15 U/L (ref 0–37)
Albumin: 3.4 g/dL — ABNORMAL LOW (ref 3.5–5.2)
Alkaline Phosphatase: 72 U/L (ref 39–117)
BUN: 7 mg/dL (ref 6–23)
CO2: 28 mEq/L (ref 19–32)
Calcium: 9 mg/dL (ref 8.4–10.5)
Chloride: 108 mEq/L (ref 96–112)
Creatinine, Ser: 0.87 mg/dL (ref 0.40–1.20)
Glucose, Bld: 87 mg/dL (ref 70–99)
Potassium: 3.7 mEq/L (ref 3.5–5.3)
Sodium: 143 mEq/L (ref 135–145)
Total Bilirubin: 0.3 mg/dL (ref 0.3–1.2)
Total Protein: 6.6 g/dL (ref 6.0–8.3)

## 2009-01-22 LAB — IRON AND TIBC
%SAT: 23 % (ref 20–55)
Iron: 60 ug/dL (ref 42–145)
TIBC: 257 ug/dL (ref 250–470)
UIBC: 197 ug/dL

## 2009-01-22 LAB — LACTATE DEHYDROGENASE: LDH: 147 U/L (ref 94–250)

## 2009-01-22 LAB — FERRITIN: Ferritin: 16 ng/mL (ref 10–291)

## 2009-01-22 LAB — TRANSFERRIN RECEPTOR, SOLUABLE: Transferrin Receptor, Soluble: 14.9 nmol/L

## 2009-02-13 ENCOUNTER — Ambulatory Visit: Payer: Self-pay | Admitting: Oncology

## 2009-02-17 LAB — FERRITIN: Ferritin: 37 ng/mL (ref 10–291)

## 2009-02-18 ENCOUNTER — Other Ambulatory Visit: Admission: RE | Admit: 2009-02-18 | Discharge: 2009-02-18 | Payer: Self-pay | Admitting: Family Medicine

## 2009-03-13 ENCOUNTER — Ambulatory Visit: Payer: Self-pay | Admitting: Oncology

## 2009-03-17 LAB — CBC WITH DIFFERENTIAL/PLATELET
BASO%: 0.3 % (ref 0.0–2.0)
Basophils Absolute: 0 10*3/uL (ref 0.0–0.1)
EOS%: 1.4 % (ref 0.0–7.0)
Eosinophils Absolute: 0.1 10*3/uL (ref 0.0–0.5)
HCT: 32.2 % — ABNORMAL LOW (ref 34.8–46.6)
HGB: 10.9 g/dL — ABNORMAL LOW (ref 11.6–15.9)
LYMPH%: 27.3 % (ref 14.0–49.7)
MCH: 27.7 pg (ref 25.1–34.0)
MCHC: 33.9 g/dL (ref 31.5–36.0)
MCV: 81.9 fL (ref 79.5–101.0)
MONO#: 0.4 10*3/uL (ref 0.1–0.9)
MONO%: 5.9 % (ref 0.0–14.0)
NEUT#: 4.7 10*3/uL (ref 1.5–6.5)
NEUT%: 65.1 % (ref 38.4–76.8)
Platelets: 303 10*3/uL (ref 145–400)
RBC: 3.93 10*6/uL (ref 3.70–5.45)
RDW: 15.7 % — ABNORMAL HIGH (ref 11.2–14.5)
WBC: 7.1 10*3/uL (ref 3.9–10.3)
lymph#: 2 10*3/uL (ref 0.9–3.3)
nRBC: 0 % (ref 0–0)

## 2009-04-28 ENCOUNTER — Ambulatory Visit: Payer: Self-pay | Admitting: Oncology

## 2009-04-30 LAB — COMPREHENSIVE METABOLIC PANEL
ALT: 19 U/L (ref 0–35)
AST: 19 U/L (ref 0–37)
Albumin: 3.3 g/dL — ABNORMAL LOW (ref 3.5–5.2)
Alkaline Phosphatase: 80 U/L (ref 39–117)
BUN: 10 mg/dL (ref 6–23)
CO2: 31 mEq/L (ref 19–32)
Calcium: 9.1 mg/dL (ref 8.4–10.5)
Chloride: 101 mEq/L (ref 96–112)
Creatinine, Ser: 0.84 mg/dL (ref 0.40–1.20)
Glucose, Bld: 262 mg/dL — ABNORMAL HIGH (ref 70–99)
Potassium: 3.5 mEq/L (ref 3.5–5.3)
Sodium: 137 mEq/L (ref 135–145)
Total Bilirubin: 0.4 mg/dL (ref 0.3–1.2)
Total Protein: 6.7 g/dL (ref 6.0–8.3)

## 2009-04-30 LAB — CBC WITH DIFFERENTIAL/PLATELET
BASO%: 0.4 % (ref 0.0–2.0)
Basophils Absolute: 0 10*3/uL (ref 0.0–0.1)
EOS%: 1.5 % (ref 0.0–7.0)
Eosinophils Absolute: 0.1 10*3/uL (ref 0.0–0.5)
HCT: 31.8 % — ABNORMAL LOW (ref 34.8–46.6)
HGB: 10.8 g/dL — ABNORMAL LOW (ref 11.6–15.9)
LYMPH%: 24.3 % (ref 14.0–49.7)
MCH: 28.9 pg (ref 25.1–34.0)
MCHC: 34 g/dL (ref 31.5–36.0)
MCV: 85.1 fL (ref 79.5–101.0)
MONO#: 0.4 10*3/uL (ref 0.1–0.9)
MONO%: 6.6 % (ref 0.0–14.0)
NEUT#: 3.8 10*3/uL (ref 1.5–6.5)
NEUT%: 67.2 % (ref 38.4–76.8)
Platelets: 256 10*3/uL (ref 145–400)
RBC: 3.73 10*6/uL (ref 3.70–5.45)
RDW: 14.5 % (ref 11.2–14.5)
WBC: 5.7 10*3/uL (ref 3.9–10.3)
lymph#: 1.4 10*3/uL (ref 0.9–3.3)

## 2009-04-30 LAB — LACTATE DEHYDROGENASE: LDH: 120 U/L (ref 94–250)

## 2009-05-01 LAB — T4, FREE: Free T4: 1.09 ng/dL (ref 0.80–1.80)

## 2009-05-01 LAB — TSH: TSH: 1.344 u[IU]/mL (ref 0.350–4.500)

## 2009-05-01 LAB — T4: T4, Total: 7.6 ug/dL (ref 5.0–12.5)

## 2009-05-03 LAB — IRON AND TIBC
%SAT: 13 % — ABNORMAL LOW (ref 20–55)
Iron: 34 ug/dL — ABNORMAL LOW (ref 42–145)
TIBC: 261 ug/dL (ref 250–470)
UIBC: 227 ug/dL

## 2009-05-03 LAB — TRANSFERRIN RECEPTOR, SOLUABLE: Transferrin Receptor, Soluble: 18.3 nmol/L

## 2009-05-03 LAB — FERRITIN: Ferritin: 33 ng/mL (ref 10–291)

## 2009-05-03 LAB — VITAMIN B12: Vitamin B-12: 344 pg/mL (ref 211–911)

## 2009-06-17 ENCOUNTER — Ambulatory Visit: Payer: Self-pay | Admitting: Oncology

## 2009-06-19 LAB — CBC WITH DIFFERENTIAL/PLATELET
BASO%: 0.4 % (ref 0.0–2.0)
Basophils Absolute: 0 10*3/uL (ref 0.0–0.1)
EOS%: 1 % (ref 0.0–7.0)
Eosinophils Absolute: 0.1 10*3/uL (ref 0.0–0.5)
HCT: 32.8 % — ABNORMAL LOW (ref 34.8–46.6)
HGB: 10.9 g/dL — ABNORMAL LOW (ref 11.6–15.9)
LYMPH%: 27.4 % (ref 14.0–49.7)
MCH: 28.5 pg (ref 25.1–34.0)
MCHC: 33.2 g/dL (ref 31.5–36.0)
MCV: 85.9 fL (ref 79.5–101.0)
MONO#: 0.5 10*3/uL (ref 0.1–0.9)
MONO%: 7.1 % (ref 0.0–14.0)
NEUT#: 4.4 10*3/uL (ref 1.5–6.5)
NEUT%: 64.1 % (ref 38.4–76.8)
Platelets: 309 10*3/uL (ref 145–400)
RBC: 3.82 10*6/uL (ref 3.70–5.45)
RDW: 14.4 % (ref 11.2–14.5)
WBC: 6.9 10*3/uL (ref 3.9–10.3)
lymph#: 1.9 10*3/uL (ref 0.9–3.3)
nRBC: 0 % (ref 0–0)

## 2009-06-19 LAB — COMPREHENSIVE METABOLIC PANEL
ALT: 16 U/L (ref 0–35)
AST: 19 U/L (ref 0–37)
Albumin: 3.2 g/dL — ABNORMAL LOW (ref 3.5–5.2)
Alkaline Phosphatase: 118 U/L — ABNORMAL HIGH (ref 39–117)
BUN: 11 mg/dL (ref 6–23)
CO2: 28 mEq/L (ref 19–32)
Calcium: 9.2 mg/dL (ref 8.4–10.5)
Chloride: 102 mEq/L (ref 96–112)
Creatinine, Ser: 0.92 mg/dL (ref 0.40–1.20)
Glucose, Bld: 172 mg/dL — ABNORMAL HIGH (ref 70–99)
Potassium: 3.7 mEq/L (ref 3.5–5.3)
Sodium: 136 mEq/L (ref 135–145)
Total Bilirubin: 0.5 mg/dL (ref 0.3–1.2)
Total Protein: 7.2 g/dL (ref 6.0–8.3)

## 2009-06-19 LAB — LACTATE DEHYDROGENASE: LDH: 139 U/L (ref 94–250)

## 2009-06-21 LAB — TRANSFERRIN RECEPTOR, SOLUABLE: Transferrin Receptor, Soluble: 13.8 nmol/L

## 2009-06-21 LAB — IRON AND TIBC
%SAT: 29 % (ref 20–55)
Iron: 59 ug/dL (ref 42–145)
TIBC: 202 ug/dL — ABNORMAL LOW (ref 250–470)
UIBC: 143 ug/dL

## 2009-06-21 LAB — FERRITIN: Ferritin: 275 ng/mL (ref 10–291)

## 2009-07-28 ENCOUNTER — Ambulatory Visit: Payer: Self-pay | Admitting: Oncology

## 2009-07-30 LAB — CBC WITH DIFFERENTIAL/PLATELET
BASO%: 0.4 % (ref 0.0–2.0)
Basophils Absolute: 0 10*3/uL (ref 0.0–0.1)
EOS%: 2.6 % (ref 0.0–7.0)
Eosinophils Absolute: 0.1 10*3/uL (ref 0.0–0.5)
HCT: 34.8 % (ref 34.8–46.6)
HGB: 11.6 g/dL (ref 11.6–15.9)
LYMPH%: 30.9 % (ref 14.0–49.7)
MCH: 29.6 pg (ref 25.1–34.0)
MCHC: 33.2 g/dL (ref 31.5–36.0)
MCV: 89.2 fL (ref 79.5–101.0)
MONO#: 0.3 10*3/uL (ref 0.1–0.9)
MONO%: 7.9 % (ref 0.0–14.0)
NEUT#: 2.6 10*3/uL (ref 1.5–6.5)
NEUT%: 58.2 % (ref 38.4–76.8)
Platelets: 270 10*3/uL (ref 145–400)
RBC: 3.9 10*6/uL (ref 3.70–5.45)
RDW: 15.6 % — ABNORMAL HIGH (ref 11.2–14.5)
WBC: 4.4 10*3/uL (ref 3.9–10.3)
lymph#: 1.4 10*3/uL (ref 0.9–3.3)

## 2009-08-01 LAB — COMPREHENSIVE METABOLIC PANEL
ALT: 17 U/L (ref 0–35)
AST: 19 U/L (ref 0–37)
Albumin: 3.4 g/dL — ABNORMAL LOW (ref 3.5–5.2)
Alkaline Phosphatase: 79 U/L (ref 39–117)
BUN: 10 mg/dL (ref 6–23)
CO2: 27 mEq/L (ref 19–32)
Calcium: 9.2 mg/dL (ref 8.4–10.5)
Chloride: 104 mEq/L (ref 96–112)
Creatinine, Ser: 0.81 mg/dL (ref 0.40–1.20)
Glucose, Bld: 159 mg/dL — ABNORMAL HIGH (ref 70–99)
Potassium: 4.1 mEq/L (ref 3.5–5.3)
Sodium: 139 mEq/L (ref 135–145)
Total Bilirubin: 0.4 mg/dL (ref 0.3–1.2)
Total Protein: 6.3 g/dL (ref 6.0–8.3)

## 2009-08-01 LAB — FERRITIN: Ferritin: 112 ng/mL (ref 10–291)

## 2009-08-01 LAB — IRON AND TIBC
%SAT: 28 % (ref 20–55)
Iron: 63 ug/dL (ref 42–145)
TIBC: 222 ug/dL — ABNORMAL LOW (ref 250–470)
UIBC: 159 ug/dL

## 2009-08-01 LAB — LACTATE DEHYDROGENASE: LDH: 153 U/L (ref 94–250)

## 2009-08-01 LAB — TRANSFERRIN RECEPTOR, SOLUABLE: Transferrin Receptor, Soluble: 17.7 nmol/L

## 2009-08-27 ENCOUNTER — Encounter: Admission: RE | Admit: 2009-08-27 | Discharge: 2009-08-27 | Payer: Self-pay | Admitting: Family Medicine

## 2009-08-27 ENCOUNTER — Ambulatory Visit: Payer: Self-pay | Admitting: Oncology

## 2009-08-27 LAB — CBC WITH DIFFERENTIAL/PLATELET
BASO%: 0.7 % (ref 0.0–2.0)
Basophils Absolute: 0 10*3/uL (ref 0.0–0.1)
EOS%: 1.9 % (ref 0.0–7.0)
Eosinophils Absolute: 0.1 10*3/uL (ref 0.0–0.5)
HCT: 34.6 % — ABNORMAL LOW (ref 34.8–46.6)
HGB: 11.4 g/dL — ABNORMAL LOW (ref 11.6–15.9)
LYMPH%: 30.7 % (ref 14.0–49.7)
MCH: 28.6 pg (ref 25.1–34.0)
MCHC: 32.9 g/dL (ref 31.5–36.0)
MCV: 86.7 fL (ref 79.5–101.0)
MONO#: 0.4 10*3/uL (ref 0.1–0.9)
MONO%: 7.3 % (ref 0.0–14.0)
NEUT#: 3.2 10*3/uL (ref 1.5–6.5)
NEUT%: 59.4 % (ref 38.4–76.8)
Platelets: 292 10*3/uL (ref 145–400)
RBC: 3.99 10*6/uL (ref 3.70–5.45)
RDW: 14.2 % (ref 11.2–14.5)
WBC: 5.4 10*3/uL (ref 3.9–10.3)
lymph#: 1.6 10*3/uL (ref 0.9–3.3)
nRBC: 0 % (ref 0–0)

## 2009-09-24 LAB — CBC WITH DIFFERENTIAL/PLATELET
BASO%: 0.4 % (ref 0.0–2.0)
Basophils Absolute: 0 10*3/uL (ref 0.0–0.1)
EOS%: 1.9 % (ref 0.0–7.0)
Eosinophils Absolute: 0.1 10*3/uL (ref 0.0–0.5)
HCT: 34 % — ABNORMAL LOW (ref 34.8–46.6)
HGB: 11 g/dL — ABNORMAL LOW (ref 11.6–15.9)
LYMPH%: 30.7 % (ref 14.0–49.7)
MCH: 28.4 pg (ref 25.1–34.0)
MCHC: 32.4 g/dL (ref 31.5–36.0)
MCV: 87.9 fL (ref 79.5–101.0)
MONO#: 0.5 10*3/uL (ref 0.1–0.9)
MONO%: 8.8 % (ref 0.0–14.0)
NEUT#: 3.1 10*3/uL (ref 1.5–6.5)
NEUT%: 58.2 % (ref 38.4–76.8)
Platelets: 292 10*3/uL (ref 145–400)
RBC: 3.87 10*6/uL (ref 3.70–5.45)
RDW: 14.7 % — ABNORMAL HIGH (ref 11.2–14.5)
WBC: 5.4 10*3/uL (ref 3.9–10.3)
lymph#: 1.6 10*3/uL (ref 0.9–3.3)
nRBC: 0 % (ref 0–0)

## 2009-10-20 ENCOUNTER — Ambulatory Visit: Payer: Self-pay | Admitting: Oncology

## 2009-10-22 LAB — CBC WITH DIFFERENTIAL/PLATELET
BASO%: 0.2 % (ref 0.0–2.0)
Basophils Absolute: 0 10*3/uL (ref 0.0–0.1)
EOS%: 1.7 % (ref 0.0–7.0)
Eosinophils Absolute: 0.1 10*3/uL (ref 0.0–0.5)
HCT: 33.1 % — ABNORMAL LOW (ref 34.8–46.6)
HGB: 11 g/dL — ABNORMAL LOW (ref 11.6–15.9)
LYMPH%: 33.6 % (ref 14.0–49.7)
MCH: 28.6 pg (ref 25.1–34.0)
MCHC: 33.2 g/dL (ref 31.5–36.0)
MCV: 86.2 fL (ref 79.5–101.0)
MONO#: 0.4 10*3/uL (ref 0.1–0.9)
MONO%: 7.7 % (ref 0.0–14.0)
NEUT#: 2.7 10*3/uL (ref 1.5–6.5)
NEUT%: 56.8 % (ref 38.4–76.8)
Platelets: 262 10*3/uL (ref 145–400)
RBC: 3.84 10*6/uL (ref 3.70–5.45)
RDW: 14.7 % — ABNORMAL HIGH (ref 11.2–14.5)
WBC: 4.8 10*3/uL (ref 3.9–10.3)
lymph#: 1.6 10*3/uL (ref 0.9–3.3)
nRBC: 0 % (ref 0–0)

## 2009-11-18 LAB — CBC WITH DIFFERENTIAL/PLATELET
BASO%: 0.6 % (ref 0.0–2.0)
Basophils Absolute: 0 10*3/uL (ref 0.0–0.1)
EOS%: 3.1 % (ref 0.0–7.0)
Eosinophils Absolute: 0.1 10*3/uL (ref 0.0–0.5)
HCT: 36.6 % (ref 34.8–46.6)
HGB: 12.3 g/dL (ref 11.6–15.9)
LYMPH%: 33.7 % (ref 14.0–49.7)
MCH: 30.4 pg (ref 25.1–34.0)
MCHC: 33.6 g/dL (ref 31.5–36.0)
MCV: 90.4 fL (ref 79.5–101.0)
MONO#: 0.3 10*3/uL (ref 0.1–0.9)
MONO%: 7.2 % (ref 0.0–14.0)
NEUT#: 2.4 10*3/uL (ref 1.5–6.5)
NEUT%: 55.4 % (ref 38.4–76.8)
Platelets: 269 10*3/uL (ref 145–400)
RBC: 4.05 10*6/uL (ref 3.70–5.45)
RDW: 14.9 % — ABNORMAL HIGH (ref 11.2–14.5)
WBC: 4.3 10*3/uL (ref 3.9–10.3)
lymph#: 1.5 10*3/uL (ref 0.9–3.3)

## 2009-11-25 ENCOUNTER — Ambulatory Visit: Payer: Self-pay | Admitting: Oncology

## 2009-11-26 LAB — COMPREHENSIVE METABOLIC PANEL
ALT: 35 U/L (ref 0–35)
AST: 24 U/L (ref 0–37)
Albumin: 3.3 g/dL — ABNORMAL LOW (ref 3.5–5.2)
Alkaline Phosphatase: 108 U/L (ref 39–117)
BUN: 18 mg/dL (ref 6–23)
CO2: 30 mEq/L (ref 19–32)
Calcium: 8.8 mg/dL (ref 8.4–10.5)
Chloride: 101 mEq/L (ref 96–112)
Creatinine, Ser: 1.03 mg/dL (ref 0.40–1.20)
Glucose, Bld: 264 mg/dL — ABNORMAL HIGH (ref 70–99)
Potassium: 3.8 mEq/L (ref 3.5–5.3)
Sodium: 136 mEq/L (ref 135–145)
Total Bilirubin: 0.6 mg/dL (ref 0.3–1.2)
Total Protein: 6.9 g/dL (ref 6.0–8.3)

## 2009-11-26 LAB — CBC WITH DIFFERENTIAL/PLATELET
BASO%: 0.4 % (ref 0.0–2.0)
Basophils Absolute: 0 10*3/uL (ref 0.0–0.1)
EOS%: 2.4 % (ref 0.0–7.0)
Eosinophils Absolute: 0.1 10*3/uL (ref 0.0–0.5)
HCT: 35.6 % (ref 34.8–46.6)
HGB: 11.8 g/dL (ref 11.6–15.9)
LYMPH%: 31.7 % (ref 14.0–49.7)
MCH: 29.1 pg (ref 25.1–34.0)
MCHC: 33.1 g/dL (ref 31.5–36.0)
MCV: 87.7 fL (ref 79.5–101.0)
MONO#: 0.4 10*3/uL (ref 0.1–0.9)
MONO%: 7.4 % (ref 0.0–14.0)
NEUT#: 3.1 10*3/uL (ref 1.5–6.5)
NEUT%: 58.1 % (ref 38.4–76.8)
Platelets: 319 10*3/uL (ref 145–400)
RBC: 4.06 10*6/uL (ref 3.70–5.45)
RDW: 13.5 % (ref 11.2–14.5)
WBC: 5.4 10*3/uL (ref 3.9–10.3)
lymph#: 1.7 10*3/uL (ref 0.9–3.3)

## 2009-11-26 LAB — IRON AND TIBC
%SAT: 38 % (ref 20–55)
Iron: 85 ug/dL (ref 42–145)
TIBC: 225 ug/dL — ABNORMAL LOW (ref 250–470)
UIBC: 140 ug/dL

## 2009-11-26 LAB — LACTATE DEHYDROGENASE: LDH: 129 U/L (ref 94–250)

## 2009-11-26 LAB — FERRITIN: Ferritin: 289 ng/mL (ref 10–291)

## 2009-11-26 LAB — VITAMIN B12: Vitamin B-12: 410 pg/mL (ref 211–911)

## 2009-12-24 LAB — CBC WITH DIFFERENTIAL/PLATELET
BASO%: 0.4 % (ref 0.0–2.0)
Basophils Absolute: 0 10*3/uL (ref 0.0–0.1)
EOS%: 1.9 % (ref 0.0–7.0)
Eosinophils Absolute: 0.1 10*3/uL (ref 0.0–0.5)
HCT: 34.4 % — ABNORMAL LOW (ref 34.8–46.6)
HGB: 11.5 g/dL — ABNORMAL LOW (ref 11.6–15.9)
LYMPH%: 33.5 % (ref 14.0–49.7)
MCH: 28.8 pg (ref 25.1–34.0)
MCHC: 33.4 g/dL (ref 31.5–36.0)
MCV: 86 fL (ref 79.5–101.0)
MONO#: 0.4 10*3/uL (ref 0.1–0.9)
MONO%: 6.8 % (ref 0.0–14.0)
NEUT#: 3 10*3/uL (ref 1.5–6.5)
NEUT%: 57.4 % (ref 38.4–76.8)
Platelets: 256 10*3/uL (ref 145–400)
RBC: 4 10*6/uL (ref 3.70–5.45)
RDW: 13.2 % (ref 11.2–14.5)
WBC: 5.2 10*3/uL (ref 3.9–10.3)
lymph#: 1.7 10*3/uL (ref 0.9–3.3)

## 2010-01-19 ENCOUNTER — Ambulatory Visit: Payer: Self-pay | Admitting: Oncology

## 2010-01-21 LAB — CBC WITH DIFFERENTIAL/PLATELET
BASO%: 0.6 % (ref 0.0–2.0)
Basophils Absolute: 0 10*3/uL (ref 0.0–0.1)
EOS%: 1.7 % (ref 0.0–7.0)
Eosinophils Absolute: 0.1 10*3/uL (ref 0.0–0.5)
HCT: 37.7 % (ref 34.8–46.6)
HGB: 12.3 g/dL (ref 11.6–15.9)
LYMPH%: 34.9 % (ref 14.0–49.7)
MCH: 28.9 pg (ref 25.1–34.0)
MCHC: 32.6 g/dL (ref 31.5–36.0)
MCV: 88.7 fL (ref 79.5–101.0)
MONO#: 0.4 10*3/uL (ref 0.1–0.9)
MONO%: 7.9 % (ref 0.0–14.0)
NEUT#: 2.4 10*3/uL (ref 1.5–6.5)
NEUT%: 54.9 % (ref 38.4–76.8)
Platelets: 256 10*3/uL (ref 145–400)
RBC: 4.24 10*6/uL (ref 3.70–5.45)
RDW: 14.3 % (ref 11.2–14.5)
WBC: 4.5 10*3/uL (ref 3.9–10.3)
lymph#: 1.6 10*3/uL (ref 0.9–3.3)

## 2010-01-22 ENCOUNTER — Ambulatory Visit: Payer: Self-pay | Admitting: Diagnostic Radiology

## 2010-01-22 ENCOUNTER — Encounter: Payer: Self-pay | Admitting: Emergency Medicine

## 2010-02-18 ENCOUNTER — Ambulatory Visit: Payer: Self-pay | Admitting: Oncology

## 2010-02-18 LAB — CBC WITH DIFFERENTIAL/PLATELET
BASO%: 0.6 % (ref 0.0–2.0)
Basophils Absolute: 0 10*3/uL (ref 0.0–0.1)
EOS%: 2.1 % (ref 0.0–7.0)
Eosinophils Absolute: 0.1 10*3/uL (ref 0.0–0.5)
HCT: 35.4 % (ref 34.8–46.6)
HGB: 12.3 g/dL (ref 11.6–15.9)
LYMPH%: 35.9 % (ref 14.0–49.7)
MCH: 29.6 pg (ref 25.1–34.0)
MCHC: 34.6 g/dL (ref 31.5–36.0)
MCV: 85.5 fL (ref 79.5–101.0)
MONO#: 0.4 10*3/uL (ref 0.1–0.9)
MONO%: 8.7 % (ref 0.0–14.0)
NEUT#: 2.3 10*3/uL (ref 1.5–6.5)
NEUT%: 52.7 % (ref 38.4–76.8)
Platelets: 256 10*3/uL (ref 145–400)
RBC: 4.15 10*6/uL (ref 3.70–5.45)
RDW: 14.3 % (ref 11.2–14.5)
WBC: 4.3 10*3/uL (ref 3.9–10.3)
lymph#: 1.5 10*3/uL (ref 0.9–3.3)

## 2010-03-18 LAB — COMPREHENSIVE METABOLIC PANEL
ALT: 25 U/L (ref 0–35)
AST: 35 U/L (ref 0–37)
Albumin: 3.6 g/dL (ref 3.5–5.2)
Alkaline Phosphatase: 66 U/L (ref 39–117)
BUN: 17 mg/dL (ref 6–23)
CO2: 25 mEq/L (ref 19–32)
Calcium: 9.2 mg/dL (ref 8.4–10.5)
Chloride: 104 mEq/L (ref 96–112)
Creatinine, Ser: 0.92 mg/dL (ref 0.40–1.20)
Glucose, Bld: 117 mg/dL — ABNORMAL HIGH (ref 70–99)
Potassium: 4 mEq/L (ref 3.5–5.3)
Sodium: 140 mEq/L (ref 135–145)
Total Bilirubin: 0.8 mg/dL (ref 0.3–1.2)
Total Protein: 6.2 g/dL (ref 6.0–8.3)

## 2010-03-18 LAB — CBC WITH DIFFERENTIAL/PLATELET
BASO%: 0.2 % (ref 0.0–2.0)
Basophils Absolute: 0 10*3/uL (ref 0.0–0.1)
EOS%: 0.4 % (ref 0.0–7.0)
Eosinophils Absolute: 0 10*3/uL (ref 0.0–0.5)
HCT: 35.8 % (ref 34.8–46.6)
HGB: 12.1 g/dL (ref 11.6–15.9)
LYMPH%: 8.1 % — ABNORMAL LOW (ref 14.0–49.7)
MCH: 29.8 pg (ref 25.1–34.0)
MCHC: 33.8 g/dL (ref 31.5–36.0)
MCV: 88.2 fL (ref 79.5–101.0)
MONO#: 0.3 10*3/uL (ref 0.1–0.9)
MONO%: 4.1 % (ref 0.0–14.0)
NEUT#: 6.8 10*3/uL — ABNORMAL HIGH (ref 1.5–6.5)
NEUT%: 87.2 % — ABNORMAL HIGH (ref 38.4–76.8)
Platelets: 231 10*3/uL (ref 145–400)
RBC: 4.06 10*6/uL (ref 3.70–5.45)
RDW: 15.5 % — ABNORMAL HIGH (ref 11.2–14.5)
WBC: 7.8 10*3/uL (ref 3.9–10.3)
lymph#: 0.6 10*3/uL — ABNORMAL LOW (ref 0.9–3.3)

## 2010-03-18 LAB — IRON AND TIBC
%SAT: 18 % — ABNORMAL LOW (ref 20–55)
Iron: 40 ug/dL — ABNORMAL LOW (ref 42–145)
TIBC: 218 ug/dL — ABNORMAL LOW (ref 250–470)
UIBC: 178 ug/dL

## 2010-03-18 LAB — VITAMIN B12: Vitamin B-12: 414 pg/mL (ref 211–911)

## 2010-03-18 LAB — FERRITIN: Ferritin: 239 ng/mL (ref 10–291)

## 2010-03-18 LAB — LACTATE DEHYDROGENASE: LDH: 153 U/L (ref 94–250)

## 2010-04-13 ENCOUNTER — Ambulatory Visit: Payer: Self-pay | Admitting: Oncology

## 2010-04-15 LAB — CBC WITH DIFFERENTIAL/PLATELET
BASO%: 0.4 % (ref 0.0–2.0)
Basophils Absolute: 0 10*3/uL (ref 0.0–0.1)
EOS%: 2.2 % (ref 0.0–7.0)
Eosinophils Absolute: 0.1 10*3/uL (ref 0.0–0.5)
HCT: 36.1 % (ref 34.8–46.6)
HGB: 12 g/dL (ref 11.6–15.9)
LYMPH%: 30.9 % (ref 14.0–49.7)
MCH: 28.8 pg (ref 25.1–34.0)
MCHC: 33.2 g/dL (ref 31.5–36.0)
MCV: 86.8 fL (ref 79.5–101.0)
MONO#: 0.4 10*3/uL (ref 0.1–0.9)
MONO%: 7.9 % (ref 0.0–14.0)
NEUT#: 3.3 10*3/uL (ref 1.5–6.5)
NEUT%: 58.6 % (ref 38.4–76.8)
Platelets: 266 10*3/uL (ref 145–400)
RBC: 4.16 10*6/uL (ref 3.70–5.45)
RDW: 14.5 % (ref 11.2–14.5)
WBC: 5.6 10*3/uL (ref 3.9–10.3)
lymph#: 1.7 10*3/uL (ref 0.9–3.3)
nRBC: 0 % (ref 0–0)

## 2010-05-13 ENCOUNTER — Ambulatory Visit: Payer: Self-pay | Admitting: Oncology

## 2010-05-14 LAB — CBC WITH DIFFERENTIAL/PLATELET
BASO%: 0.5 % (ref 0.0–2.0)
Basophils Absolute: 0 10*3/uL (ref 0.0–0.1)
EOS%: 1.8 % (ref 0.0–7.0)
Eosinophils Absolute: 0.1 10*3/uL (ref 0.0–0.5)
HCT: 32.7 % — ABNORMAL LOW (ref 34.8–46.6)
HGB: 10.7 g/dL — ABNORMAL LOW (ref 11.6–15.9)
LYMPH%: 31.5 % (ref 14.0–49.7)
MCH: 28.4 pg (ref 25.1–34.0)
MCHC: 32.7 g/dL (ref 31.5–36.0)
MCV: 86.7 fL (ref 79.5–101.0)
MONO#: 0.3 10*3/uL (ref 0.1–0.9)
MONO%: 5.1 % (ref 0.0–14.0)
NEUT#: 3.3 10*3/uL (ref 1.5–6.5)
NEUT%: 61.1 % (ref 38.4–76.8)
Platelets: 284 10*3/uL (ref 145–400)
RBC: 3.77 10*6/uL (ref 3.70–5.45)
RDW: 14.7 % — ABNORMAL HIGH (ref 11.2–14.5)
WBC: 5.5 10*3/uL (ref 3.9–10.3)
lymph#: 1.7 10*3/uL (ref 0.9–3.3)

## 2010-06-10 LAB — CBC WITH DIFFERENTIAL/PLATELET
BASO%: 0.3 % (ref 0.0–2.0)
Basophils Absolute: 0 10*3/uL (ref 0.0–0.1)
EOS%: 1.4 % (ref 0.0–7.0)
Eosinophils Absolute: 0.1 10*3/uL (ref 0.0–0.5)
HCT: 37.6 % (ref 34.8–46.6)
HGB: 12.1 g/dL (ref 11.6–15.9)
LYMPH%: 30.9 % (ref 14.0–49.7)
MCH: 29 pg (ref 25.1–34.0)
MCHC: 32.2 g/dL (ref 31.5–36.0)
MCV: 90.2 fL (ref 79.5–101.0)
MONO#: 0.4 10*3/uL (ref 0.1–0.9)
MONO%: 5.5 % (ref 0.0–14.0)
NEUT#: 4.3 10*3/uL (ref 1.5–6.5)
NEUT%: 61.9 % (ref 38.4–76.8)
Platelets: 254 10*3/uL (ref 145–400)
RBC: 4.17 10*6/uL (ref 3.70–5.45)
RDW: 14.7 % — ABNORMAL HIGH (ref 11.2–14.5)
WBC: 6.9 10*3/uL (ref 3.9–10.3)
lymph#: 2.1 10*3/uL (ref 0.9–3.3)
nRBC: 0 % (ref 0–0)

## 2010-07-07 ENCOUNTER — Ambulatory Visit: Payer: Self-pay | Admitting: Oncology

## 2010-07-08 ENCOUNTER — Observation Stay (HOSPITAL_COMMUNITY): Admission: EM | Admit: 2010-07-08 | Discharge: 2010-01-24 | Payer: Self-pay | Admitting: Internal Medicine

## 2010-07-09 LAB — CBC WITH DIFFERENTIAL/PLATELET
BASO%: 0.5 % (ref 0.0–2.0)
Basophils Absolute: 0 10*3/uL (ref 0.0–0.1)
EOS%: 1.7 % (ref 0.0–7.0)
Eosinophils Absolute: 0.1 10*3/uL (ref 0.0–0.5)
HCT: 30.9 % — ABNORMAL LOW (ref 34.8–46.6)
HGB: 10.5 g/dL — ABNORMAL LOW (ref 11.6–15.9)
LYMPH%: 22.7 % (ref 14.0–49.7)
MCH: 30 pg (ref 25.1–34.0)
MCHC: 34 g/dL (ref 31.5–36.0)
MCV: 88.2 fL (ref 79.5–101.0)
MONO#: 0.4 10*3/uL (ref 0.1–0.9)
MONO%: 7.4 % (ref 0.0–14.0)
NEUT#: 3.9 10*3/uL (ref 1.5–6.5)
NEUT%: 67.7 % (ref 38.4–76.8)
Platelets: 256 10*3/uL (ref 145–400)
RBC: 3.5 10*6/uL — ABNORMAL LOW (ref 3.70–5.45)
RDW: 14.8 % — ABNORMAL HIGH (ref 11.2–14.5)
WBC: 5.7 10*3/uL (ref 3.9–10.3)
lymph#: 1.3 10*3/uL (ref 0.9–3.3)

## 2010-08-05 ENCOUNTER — Ambulatory Visit (HOSPITAL_BASED_OUTPATIENT_CLINIC_OR_DEPARTMENT_OTHER): Payer: BC Managed Care – PPO | Admitting: Oncology

## 2010-08-05 LAB — COMPREHENSIVE METABOLIC PANEL
ALT: 13 U/L (ref 0–35)
AST: 14 U/L (ref 0–37)
Albumin: 3.6 g/dL (ref 3.5–5.2)
Alkaline Phosphatase: 76 U/L (ref 39–117)
BUN: 15 mg/dL (ref 6–23)
CO2: 29 mEq/L (ref 19–32)
Calcium: 9.1 mg/dL (ref 8.4–10.5)
Chloride: 100 mEq/L (ref 96–112)
Creatinine, Ser: 0.97 mg/dL (ref 0.40–1.20)
Glucose, Bld: 102 mg/dL — ABNORMAL HIGH (ref 70–99)
Potassium: 3.9 mEq/L (ref 3.5–5.3)
Sodium: 138 mEq/L (ref 135–145)
Total Bilirubin: 0.4 mg/dL (ref 0.3–1.2)
Total Protein: 6.7 g/dL (ref 6.0–8.3)

## 2010-08-05 LAB — CBC WITH DIFFERENTIAL/PLATELET
BASO%: 0.6 % (ref 0.0–2.0)
Basophils Absolute: 0 10*3/uL (ref 0.0–0.1)
EOS%: 0.7 % (ref 0.0–7.0)
Eosinophils Absolute: 0 10*3/uL (ref 0.0–0.5)
HCT: 37.3 % (ref 34.8–46.6)
HGB: 12.2 g/dL (ref 11.6–15.9)
LYMPH%: 32.1 % (ref 14.0–49.7)
MCH: 28.4 pg (ref 25.1–34.0)
MCHC: 32.7 g/dL (ref 31.5–36.0)
MCV: 86.9 fL (ref 79.5–101.0)
MONO#: 0.4 10*3/uL (ref 0.1–0.9)
MONO%: 8 % (ref 0.0–14.0)
NEUT#: 3.1 10*3/uL (ref 1.5–6.5)
NEUT%: 58.6 % (ref 38.4–76.8)
Platelets: 329 10*3/uL (ref 145–400)
RBC: 4.29 10*6/uL (ref 3.70–5.45)
RDW: 13.9 % (ref 11.2–14.5)
WBC: 5.4 10*3/uL (ref 3.9–10.3)
lymph#: 1.7 10*3/uL (ref 0.9–3.3)
nRBC: 0 % (ref 0–0)

## 2010-08-05 LAB — IRON AND TIBC
%SAT: 32 % (ref 20–55)
Iron: 84 ug/dL (ref 42–145)
TIBC: 259 ug/dL (ref 250–470)
UIBC: 175 ug/dL

## 2010-08-05 LAB — FERRITIN: Ferritin: 128 ng/mL (ref 10–291)

## 2010-08-05 LAB — LACTATE DEHYDROGENASE: LDH: 130 U/L (ref 94–250)

## 2010-08-05 LAB — VITAMIN B12: Vitamin B-12: 383 pg/mL (ref 211–911)

## 2010-09-02 ENCOUNTER — Ambulatory Visit (HOSPITAL_BASED_OUTPATIENT_CLINIC_OR_DEPARTMENT_OTHER): Payer: BC Managed Care – PPO | Admitting: Oncology

## 2010-09-02 DIAGNOSIS — D649 Anemia, unspecified: Secondary | ICD-10-CM

## 2010-09-02 LAB — CBC WITH DIFFERENTIAL/PLATELET
BASO%: 0.4 % (ref 0.0–2.0)
Basophils Absolute: 0 10*3/uL (ref 0.0–0.1)
EOS%: 1.8 % (ref 0.0–7.0)
Eosinophils Absolute: 0.1 10*3/uL (ref 0.0–0.5)
HCT: 34.9 % (ref 34.8–46.6)
HGB: 11.6 g/dL (ref 11.6–15.9)
LYMPH%: 28.6 % (ref 14.0–49.7)
MCH: 28.2 pg (ref 25.1–34.0)
MCHC: 33.2 g/dL (ref 31.5–36.0)
MCV: 84.7 fL (ref 79.5–101.0)
MONO#: 0.3 10*3/uL (ref 0.1–0.9)
MONO%: 6 % (ref 0.0–14.0)
NEUT#: 3.5 10*3/uL (ref 1.5–6.5)
NEUT%: 63.2 % (ref 38.4–76.8)
Platelets: 271 10*3/uL (ref 145–400)
RBC: 4.12 10*6/uL (ref 3.70–5.45)
RDW: 14 % (ref 11.2–14.5)
WBC: 5.5 10*3/uL (ref 3.9–10.3)
lymph#: 1.6 10*3/uL (ref 0.9–3.3)
nRBC: 0 % (ref 0–0)

## 2010-09-03 LAB — FECAL OCCULT BLOOD, GUAIAC: Occult Blood: NEGATIVE

## 2010-09-30 ENCOUNTER — Encounter (HOSPITAL_BASED_OUTPATIENT_CLINIC_OR_DEPARTMENT_OTHER): Payer: BC Managed Care – PPO | Admitting: Oncology

## 2010-09-30 ENCOUNTER — Other Ambulatory Visit (HOSPITAL_COMMUNITY): Payer: Self-pay | Admitting: Oncology

## 2010-09-30 DIAGNOSIS — D649 Anemia, unspecified: Secondary | ICD-10-CM

## 2010-09-30 LAB — CBC WITH DIFFERENTIAL/PLATELET
BASO%: 0.4 % (ref 0.0–2.0)
Basophils Absolute: 0 10*3/uL (ref 0.0–0.1)
EOS%: 1.5 % (ref 0.0–7.0)
Eosinophils Absolute: 0.1 10*3/uL (ref 0.0–0.5)
HCT: 33.4 % — ABNORMAL LOW (ref 34.8–46.6)
HGB: 10.9 g/dL — ABNORMAL LOW (ref 11.6–15.9)
LYMPH%: 37.4 % (ref 14.0–49.7)
MCH: 27.7 pg (ref 25.1–34.0)
MCHC: 32.6 g/dL (ref 31.5–36.0)
MCV: 85 fL (ref 79.5–101.0)
MONO#: 0.4 10*3/uL (ref 0.1–0.9)
MONO%: 8.1 % (ref 0.0–14.0)
NEUT#: 2.8 10*3/uL (ref 1.5–6.5)
NEUT%: 52.6 % (ref 38.4–76.8)
Platelets: 330 10*3/uL (ref 145–400)
RBC: 3.93 10*6/uL (ref 3.70–5.45)
RDW: 14.5 % (ref 11.2–14.5)
WBC: 5.3 10*3/uL (ref 3.9–10.3)
lymph#: 2 10*3/uL (ref 0.9–3.3)
nRBC: 0 % (ref 0–0)

## 2010-10-17 LAB — CBC
HCT: 36.6 % (ref 36.0–46.0)
HCT: 37.3 % (ref 36.0–46.0)
Hemoglobin: 12.4 g/dL (ref 12.0–15.0)
Hemoglobin: 12.5 g/dL (ref 12.0–15.0)
MCH: 29.3 pg (ref 26.0–34.0)
MCH: 30.3 pg (ref 26.0–34.0)
MCHC: 33.5 g/dL (ref 30.0–36.0)
MCHC: 33.8 g/dL (ref 30.0–36.0)
MCV: 87.5 fL (ref 78.0–100.0)
MCV: 89.7 fL (ref 78.0–100.0)
Platelets: 238 10*3/uL (ref 150–400)
Platelets: 273 10*3/uL (ref 150–400)
RBC: 4.09 MIL/uL (ref 3.87–5.11)
RBC: 4.27 MIL/uL (ref 3.87–5.11)
RDW: 13.3 % (ref 11.5–15.5)
RDW: 14.2 % (ref 11.5–15.5)
WBC: 5.6 10*3/uL (ref 4.0–10.5)
WBC: 6.1 10*3/uL (ref 4.0–10.5)

## 2010-10-17 LAB — COMPREHENSIVE METABOLIC PANEL
ALT: 18 U/L (ref 0–35)
ALT: 19 U/L (ref 0–35)
AST: 18 U/L (ref 0–37)
AST: 27 U/L (ref 0–37)
Albumin: 3.1 g/dL — ABNORMAL LOW (ref 3.5–5.2)
Albumin: 3.4 g/dL — ABNORMAL LOW (ref 3.5–5.2)
Alkaline Phosphatase: 72 U/L (ref 39–117)
Alkaline Phosphatase: 97 U/L (ref 39–117)
BUN: 10 mg/dL (ref 6–23)
BUN: 14 mg/dL (ref 6–23)
CO2: 28 mEq/L (ref 19–32)
CO2: 28 mEq/L (ref 19–32)
Calcium: 8.8 mg/dL (ref 8.4–10.5)
Calcium: 9.2 mg/dL (ref 8.4–10.5)
Chloride: 104 mEq/L (ref 96–112)
Chloride: 104 mEq/L (ref 96–112)
Creatinine, Ser: 0.77 mg/dL (ref 0.4–1.2)
Creatinine, Ser: 0.8 mg/dL (ref 0.4–1.2)
GFR calc Af Amer: >60 mL/min (ref >60)
GFR calc Af Amer: >60 mL/min (ref >60)
GFR calc non Af Amer: >60 mL/min (ref >60)
GFR calc non Af Amer: >60 mL/min (ref >60)
Glucose, Bld: 189 mg/dL — ABNORMAL HIGH (ref 70–99)
Glucose, Bld: 195 mg/dL — ABNORMAL HIGH (ref 70–99)
Potassium: 3.6 mEq/L (ref 3.5–5.1)
Potassium: 4 mEq/L (ref 3.5–5.1)
Sodium: 139 mEq/L (ref 135–145)
Sodium: 139 mEq/L (ref 135–145)
Total Bilirubin: 0.4 mg/dL (ref 0.3–1.2)
Total Bilirubin: 0.5 mg/dL (ref 0.3–1.2)
Total Protein: 6.4 g/dL (ref 6.0–8.3)
Total Protein: 7.4 g/dL (ref 6.0–8.3)

## 2010-10-17 LAB — LIPID PANEL
Cholesterol: 241 mg/dL — ABNORMAL HIGH (ref 0–200)
HDL: 49 mg/dL (ref >39)
LDL Cholesterol: 181 mg/dL — ABNORMAL HIGH (ref 0–99)
Total CHOL/HDL Ratio: 4.9 RATIO
Triglycerides: 57 mg/dL (ref <150)
VLDL: 11 mg/dL (ref 0–40)

## 2010-10-17 LAB — POCT CARDIAC MARKERS
CKMB, poc: <1 ng/mL — ABNORMAL LOW (ref 1.0–8.0)
Myoglobin, poc: 43.6 ng/mL (ref 12–200)
Troponin i, poc: <0.05 ng/mL (ref 0.00–0.09)

## 2010-10-17 LAB — PROTIME-INR
INR: 1 (ref 0.00–1.49)
INR: 1.07 (ref 0.00–1.49)
Prothrombin Time: 13.1 seconds (ref 11.6–15.2)
Prothrombin Time: 13.8 seconds (ref 11.6–15.2)

## 2010-10-17 LAB — GLUCOSE, CAPILLARY
Glucose-Capillary: 176 mg/dL — ABNORMAL HIGH (ref 70–99)
Glucose-Capillary: 179 mg/dL — ABNORMAL HIGH (ref 70–99)
Glucose-Capillary: 207 mg/dL — ABNORMAL HIGH (ref 70–99)
Glucose-Capillary: 232 mg/dL — ABNORMAL HIGH (ref 70–99)

## 2010-10-17 LAB — CARDIAC PANEL(CRET KIN+CKTOT+MB+TROPI)
CK, MB: 0.6 ng/mL (ref 0.3–4.0)
Relative Index: INVALID (ref 0.0–2.5)
Total CK: 47 U/L (ref 7–177)
Troponin I: 0.01 ng/mL (ref 0.00–0.06)

## 2010-10-17 LAB — HEMOGLOBIN A1C
Hgb A1c MFr Bld: 8.7 % — ABNORMAL HIGH (ref <5.7)
Mean Plasma Glucose: 203 mg/dL — ABNORMAL HIGH (ref <117)

## 2010-10-17 LAB — APTT
aPTT: 27 seconds (ref 24–37)
aPTT: 27 seconds (ref 24–37)

## 2010-10-17 LAB — DIFFERENTIAL
Basophils Absolute: 0 10*3/uL (ref 0.0–0.1)
Basophils Relative: 1 % (ref 0–1)
Eosinophils Absolute: 0.1 10*3/uL (ref 0.0–0.7)
Eosinophils Relative: 2 % (ref 0–5)
Lymphocytes Relative: 32 % (ref 12–46)
Lymphs Abs: 1.8 10*3/uL (ref 0.7–4.0)
Monocytes Absolute: 0.4 10*3/uL (ref 0.1–1.0)
Monocytes Relative: 8 % (ref 3–12)
Neutro Abs: 3.3 10*3/uL (ref 1.7–7.7)
Neutrophils Relative %: 59 % (ref 43–77)

## 2010-10-28 ENCOUNTER — Encounter (HOSPITAL_BASED_OUTPATIENT_CLINIC_OR_DEPARTMENT_OTHER): Payer: BC Managed Care – PPO | Admitting: Oncology

## 2010-10-28 ENCOUNTER — Other Ambulatory Visit (HOSPITAL_COMMUNITY): Payer: Self-pay | Admitting: Oncology

## 2010-10-28 DIAGNOSIS — D649 Anemia, unspecified: Secondary | ICD-10-CM

## 2010-10-28 LAB — CBC WITH DIFFERENTIAL/PLATELET
BASO%: 0.8 % (ref 0.0–2.0)
Basophils Absolute: 0 10*3/uL (ref 0.0–0.1)
EOS%: 2.1 % (ref 0.0–7.0)
Eosinophils Absolute: 0.1 10*3/uL (ref 0.0–0.5)
HCT: 37.9 % (ref 34.8–46.6)
HGB: 12.4 g/dL (ref 11.6–15.9)
LYMPH%: 38.3 % (ref 14.0–49.7)
MCH: 27.7 pg (ref 25.1–34.0)
MCHC: 32.7 g/dL (ref 31.5–36.0)
MCV: 84.8 fL (ref 79.5–101.0)
MONO#: 0.4 10*3/uL (ref 0.1–0.9)
MONO%: 7.4 % (ref 0.0–14.0)
NEUT#: 2.6 10*3/uL (ref 1.5–6.5)
NEUT%: 51.4 % (ref 38.4–76.8)
Platelets: 271 10*3/uL (ref 145–400)
RBC: 4.47 10*6/uL (ref 3.70–5.45)
RDW: 14.6 % — ABNORMAL HIGH (ref 11.2–14.5)
WBC: 5.1 10*3/uL (ref 3.9–10.3)
lymph#: 2 10*3/uL (ref 0.9–3.3)
nRBC: 0 % (ref 0–0)

## 2010-11-25 ENCOUNTER — Encounter (HOSPITAL_BASED_OUTPATIENT_CLINIC_OR_DEPARTMENT_OTHER): Payer: BC Managed Care – PPO | Admitting: Oncology

## 2010-11-25 ENCOUNTER — Other Ambulatory Visit (HOSPITAL_COMMUNITY): Payer: Self-pay | Admitting: Oncology

## 2010-11-25 DIAGNOSIS — D649 Anemia, unspecified: Secondary | ICD-10-CM

## 2010-11-25 LAB — CBC WITH DIFFERENTIAL/PLATELET
BASO%: 0.5 % (ref 0.0–2.0)
Basophils Absolute: 0 10*3/uL (ref 0.0–0.1)
EOS%: 2.8 % (ref 0.0–7.0)
Eosinophils Absolute: 0.1 10*3/uL (ref 0.0–0.5)
HCT: 33.6 % — ABNORMAL LOW (ref 34.8–46.6)
HGB: 11.3 g/dL — ABNORMAL LOW (ref 11.6–15.9)
LYMPH%: 34.8 % (ref 14.0–49.7)
MCH: 28.6 pg (ref 25.1–34.0)
MCHC: 33.5 g/dL (ref 31.5–36.0)
MCV: 85.4 fL (ref 79.5–101.0)
MONO#: 0.4 10*3/uL (ref 0.1–0.9)
MONO%: 8 % (ref 0.0–14.0)
NEUT#: 2.8 10*3/uL (ref 1.5–6.5)
NEUT%: 53.9 % (ref 38.4–76.8)
Platelets: 224 10*3/uL (ref 145–400)
RBC: 3.94 10*6/uL (ref 3.70–5.45)
RDW: 15.1 % — ABNORMAL HIGH (ref 11.2–14.5)
WBC: 5.1 10*3/uL (ref 3.9–10.3)
lymph#: 1.8 10*3/uL (ref 0.9–3.3)

## 2010-12-17 NOTE — Op Note (Signed)
NAME:  Catherine Bautista, Catherine Bautista                     ACCOUNT NO.:  [id number]   MEDICAL RECORD NO.:  [id number]          PATIENT TYPE:  AMB   LOCATION:  ENDO                         FACILITY:  [facility]   PHYSICIAN:  Anselmo Rod, M.D.  DATE OF BIRTH:  [birth year]   DATE OF PROCEDURE:  DATE OF DISCHARGE:                               OPERATIVE REPORT   PROCEDURE PERFORMED:  Esophagogastroduodenoscopy with multiple biopsies.   ENDOSCOPIST:  Anselmo Rod, M.D.   INSTRUMENT USED:  Pentax video panendoscope.   INDICATIONS FOR PROCEDURE:  60 year old African American female with a  history of carcinoid found on one the biopsies done for iron deficiency  anemia from the small bowel, undergoing a repeat EGD.  The patient has  an abnormal CT scan showing mild distal esophageal wall thickening and  distension and postoperative changes in the stomach.  Rule out  esophagitis, peptic ulcer disease, etc., and small bowel biopsies  planned to further evaluate the patient for carcinoid.   PRE-PROCEDURE PREPARATION:  Informed consent was procured from the  patient.  The patient fasted for 8 hours prior to procedure. Risks and  benefits of the procedure were discussed with the patient in great  detail.   PRE-PROCEDURE PHYSICAL:  VITAL SIGNS:  Stable vital signs.  NECK:  Supple.  CHEST:  Clear to auscultation.  HEART:  S1-S2 regular.  ABDOMEN:  Soft with normal bowel sounds.   DESCRIPTION OF THE PROCEDURE:  The patient was placed in the left  lateral decubitus position and sedated with 100 mcg of Fentanyl and 6 mg  of Versed, given intravenously in slow incremental doses.  Once the  patient was adequately sedated and maintained on low-flow oxygen and  continuous cardiac monitoring, the Pentax video panendoscope was  advanced through the mouthpiece over the tongue into the esophagus under  direct vision.  The esophagus seemed somewhat distended with a large  amount of mucus in the mid esophageal area.   Multiple washings were  done.  No ulcers, erosions, masses or polyps were seen.  There was no  evidence of Barrett's mucosa.  The scope was then advanced into the  stomach.  Postoperative changes consistent with gastric stapling was  noted.  Mild diffuse gastritis was present.  No ulcers, erosions, masses  or polyps were identified.  The proximal small bowel appeared normal.  Multiple biopsies were done to evaluate the patient for carcinoid;  however, there was no definite mass, ulcer, mass or polyps noted in this  area.  The patient tolerated the procedure well without complications.   IMPRESSION:  1. Distended esophagus.  Rule out achalasia.  2. Postoperative changes noted in the stomach consistent with gastric      stapling.  3. Mild diffuse gastritis.  4. Normal proximal small bowel biopsies done to evaluate for      carcinoid.   RECOMMENDATIONS:  1. Await pathology results.  2. Avoid all nonsteroidals for now.  3. Proceed with a colonoscopy at this time.  4. Further recommendations will be made after colonoscopy has been  done.      Anselmo Rod, M.D.  Electronically Signed     JNM/MEDQ  D:  11/13/2006  T:  11/13/2006  Job:  [id number]   cc:   Leonie Man, M.D.  Renaye Rakers, M.D.

## 2010-12-17 NOTE — Discharge Summary (Signed)
[place]. [facility]  Patient:    Catherine Bautista, Catherine Bautista Visit Number: [id number] MRN: [id number]          Service Type: MED Location: 3000 3006 01 Attending Physician:  Catherine Bautista Admit Date:  03/28/2001 Disc. Date: 03/30/01   CC:         Catherine Bautista, M.D.   Discharge Summary  ADMISSION DIAGNOSES: 1. Mild aphasia syndrome, rule out left temporal stroke. 2. Diabetes. 3. Hypertension.  DISCHARGE DIAGNOSES: 1. Left temporal stroke by MRI. 2. Diabetes. 3. Hypertension. 4. Hypercholesterolemia.  PROCEDURE: 1. MRI of the brain. 2. MR angiogram. 3. Chronic Doppler study. 4. A 2-D echocardiogram.  COMPLICATIONS:  None.  HISTORY OF PRESENT ILLNESS:  Catherine Bautista is a 60 year old right-handed black female born 13-Feb-1951 with a history of significant obesity, diabetes, and hypertension.  Patient presented to [facility] the day prior to this admission with an episode of feeling weak all over, unable to talk, fell on her way back to the bathroom and possibly blacked out. Patient was incontinent of stool at that time.  Patient noted some improvement in speech in about 10-20 minutes.  Was brought to the emergency room for an evaluation.  CBG done by EMS was 260, according to the patients husband. Patient was seen by Dr. ______ from neurology.  CT scan of the head was performed and apparently was unremarkable.  Patient was discharged to home. Patient returned to [facility] the day after with ongoing complaints with problems with thinking, understanding what was being told to her, feels confused, left frontotemporal headache.  PAST MEDICAL HISTORY: 1. New onset of language disturbance. 2. History of syncope. 3. History of obesity. 4. History of diabetes. 5. History of gastric stapling procedure in the 1980s. 6. History of hypertension. 7. History of hypercholesterolemia. 8. History of migraine. 9. New  onset of left temporal stroke.  MEDICATIONS: 1. Altace 5 mg q.d. 2. Patient says she takes Glucophage "as needed." 3. Lantus insulin 15 units q.p.m. 4. Avandia 8 mg q.d. 5. Hydrochlorothiazide 25 mg q.d.  ALLERGIES:  No known drug allergies.  SOCIAL HISTORY:  Does not smoke or drink.  Please refer to history and physical and patient summary.  FAMILY HISTORY:  Please refer to history and physical and patient summary.  REVIEW OF SYSTEMS:  Please refer to history and physical and patient summary.  PHYSICAL EXAMINATION:  Please refer to history and physical and patient summary.  LABORATORIES:  TSH 1.4.  Antithrombin III level 95.  Drug screen is negative. Cholesterol panel reveals total cholesterol 273, triglycerides 77, HDL 38, LDL 220.  White count 4.2, hemoglobin 10.9, hematocrit 32.1, MCV 83.9, platelets 308,000.  Sedimentation rate 24.  Protime with INR 1.1.  Calcium 9.1, total protein 6.6, albumin 3.00, AST 13, ALT 11, ALP 49, total bilirubin 0.6, sodium 136, potassium 3.5, chloride 104, CO2 26, glucose 153, BUN 6, creatinine 0.9. CPK level 69, MB fraction 0.5, troponin I 0.01.  EKG reveals normal sinus rhythm, left axis deviation, cannot rule out anterior infarct age undetermined, heart rate 60.  Chest x-ray shows no acute disease.  HOSPITAL COURSE:  Patient has done well during the course of hospitalization. Patient was admitted for evaluation of possible left temporal stroke.  Patient is set up for an MRI scan of the brain and this was performed and in fact did show a left temporoparietal acute infarct.  There was moderate stenosis of the right A1 segment, otherwise  intracranial vessels were unremarkable.  This patient underwent a 2-D echocardiogram that revealed an ejection fraction of 55-65%, right atrium normal, tricuspid and pulmonic valves not well visualized, right ventricle normal, left atrium normal, mitral valve normal, aortic valve normal, left ventricular size  normal, overall left ventricular systolic function normal.  Patient underwent a carotid Doppler study that was unremarkable.  Patient was given IV fluids, placed on aspirin which she continued throughout the hospitalization.  DISCHARGE MEDICATIONS: 1. Aspirin 325 mg q.d. 2. Altace 5 mg q.d. 3. Lantus insulin 15 units q.p.m. 4. Avandia 8 mg q.d. 5. Hydrochlorothiazide 25 mg q.d.  ACTIVITY:  No strenuous activity for six weeks.  DIET:  1800 calorie ADA no added salt diet.  FOLLOW-UP:  Dr. Anne Bautista within 59 to three weeks.  Will have regular followup with Dr. Renaye Bautista as well.  The cholesterol levels were elevated as previously noted and patient likely will benefit from a cholesterol lowering agent.  I will leave the preference of medication selection to Dr. Renaye Bautista upon followup within the next week.  Patient will also be seen and evaluated by speech therapy for mild cognitive and speech deficits following a stroke. At the time of discharge patient has normal strength throughout.  Has a mild relative right homonymous right hemianopsia, double simultaneous stimulation, and has mild anomia, ______ speech.  There is some concern that the stroke may have been embolic given the possible blackout, distribution of stroke on the MRI scan.  Source of embolus is not clear from the workup.  Patient, however, does have multiple risk factors for stroke events including diabetes, hypertension, hypercholesterolemia. Attending Physician:  Catherine Bautista DD:  03/30/01 TD:  03/30/01 Job: [id number]

## 2010-12-17 NOTE — H&P (Signed)
[place]. [facility]  Patient:    UVA, RUNKEL Visit Number: [id number] MRN: [id number]          Service Type: MED Location: 1800 1833 01 Attending Physician:  Ilene Qua Dictated by:   Marlan Palau, M.D. Adm. Date:  03/28/2001   CC:         Geraldo Pitter, M.D.  [facility] [address]   History and Physical  HISTORY OF PRESENT ILLNESS:  The patient is a 60 year old  right-handed black female born Nov 02, 1950, with a history of significant obesity, diabetes and hypertension.  This patient comes to [facility] Emergency Room one day after visiting the emergency room at [facility].  The patient noted yesterday that when she got up out of bed to go to the emergency room, felt "weak all over,"  was unable to talk, fell on the way to back and possibly blacked out.  The patient apparently was incontinent of stool when this occurred.  The patient has had a headache in the left frontotemporal region since that time.  The patients speech returned within about 10 to 20 minutes, was brought to the emergency room.  A CBG done by EMS was in the 260 range, according to the patients husband. The patient denies any numbness on the extremities, denies any visual field loss.  The patient underwent a CT scan of the head at [facility] which was unremarkable by report.  Dr. Maple Hudson, from neurology, saw this patient and sent this patient out yesterday.  The patient, however, has continued to have a left frontotemporal headache and describes difficulty with understanding what is being told to her.  The patient is fully ambulatory, has been able to use the arms and legs, has no numbness or visual field changes. The patient feels generally confused, denies neck stiffness.  The patient does have a history of migraines with some regularity.  She has never had any speech problems or blackout spells with  her migraines.  PAST MEDICAL HISTORY:  Significant for: 1. New onset of language disturbance. 2. History of syncope. 3. History of obesity. 4. History of diabetes. 5. History of gastric stapling procedure in the 1980s. 6. History of hypertension.  MEDICATIONS: 1. Altace 5 mg daily. 2. The patient is on glucophage as she describes "as needed." 3. Lantus insulin 15 units in the evenings. 4. Avandia 8 mg daily. 5. Hydrochlorothiazide 25 mg daily.  ALLERGIES:  No known drug allergies.  SOCIAL HISTORY:  She does not smoke or drink.  The patient lives in the [place], [place], area.  She is married, has three children who are alive and well.  The patient is going for her doctorate.  FAMILY MEDICAL HISTORY:  It is notable that mother died with congestive heart failure, father died with cancer. The patient has one brother with hepatitis C and an adopted sister who is alive and well.  REVIEW OF SYSTEMS:  Notable for no fever or chills. The patient has a left frontotemporal headache, denies shortness of breath, chest pain, nausea and vomiting.  The patient does have diarrhea on and off. She denies any problems otherwise controlling the bowels or bladder.  She does note some slight dizziness yesterday and today.  Denies any numbness of the extremities.  PHYSICAL EXAMINATION:  GENERAL APPEARANCE:  This patient is a markedly obese black female who is alert and cooperative at the time of examination.  VITAL SIGNS:  Blood pressure 152/85, heart rate 68, respiratory rate 18, temperature afebrile.  HEENT:  Head is atraumatic.  Eyes:  Pupils are equal, round and reactive to light.  Discs are flat bilaterally.  NECK:  Supple.  No carotid bruits noted.  RESPIRATORY:  Clear.  CARDIOVASCULAR:  There are distant heart sounds, no obvious murmurs or rubs noted.  EXTREMITIES:  Exam did not show any significant edema with the exception of 1+ edema at the ankles  bilaterally.  NEUROLOGICAL:  The patient has symmetric faces.  Good sensory to pinprick sensation from one side of the face to the neck.  Good strength of facial muscles and muscles to head turning.  Shoulder shrug bilaterally is noted. Speech is well enunciated, not obviously aphasic.  The patient does to have difficulty in understanding spoken language at times, has difficulty with repetition, has some difficulty with naming, has circumlocutory speech patterns.  Visual fields appear to be relatively full.  Deep tendon reflexes were pressed but symmetric throughout. Toes were neutral bilaterally.  The patient has good strength on all four extremities.  Good symmetric motor tone is noted throughout.  Pinprick, soft touch, vibratory sensation was symmetric throughout.  The patient has finger-nose-finger and toe-to-finger bilaterally. The patient was not ambulated.  Admission laboratory values, chest x-ray, and EKG are all pending at the time of this evaluation. Will also obtain a drug screen.  IMPRESSION: 1. History of new onset language disturbance, rule out left temporal stroke. 2. History of migraine headache, rule out complicated migraine. 3. History of diabetes. 4. History of obesity.  The patient has a very subtle language disturbance at this point.  No obvious hemiparesis, visual field changes, hemisensory deficits are noted.  Will admit this patient for work-up of a stroke-type event.  Need to consider also the possibility of a complicated migraine and rule out subarachnoid hemorrhage as well.  PLAN: 1. MRI scan of the brain. 2. MR angiogram of intracranial vessels. 3. Carotid Doppler study. 4. A 2-D echocardiogram. 5. Admission blood work, EKG, chest x-ray. 6. Will follow the patients course while in house.  Also do need to consider    and rule out the possibility of an unrecognized hypoglycemic event. Dictated by:   Marlan Palau, M.D.  Attending Physician:  Ilene Qua DD:  03/28/01 TD:  03/28/01 Job: [id number]

## 2010-12-23 ENCOUNTER — Ambulatory Visit (HOSPITAL_BASED_OUTPATIENT_CLINIC_OR_DEPARTMENT_OTHER): Payer: BC Managed Care – PPO | Admitting: Oncology

## 2010-12-23 ENCOUNTER — Other Ambulatory Visit (HOSPITAL_COMMUNITY): Payer: Self-pay | Admitting: Oncology

## 2010-12-23 VITALS — Ht 66.0 in | Wt 265.3 lb

## 2010-12-23 DIAGNOSIS — D649 Anemia, unspecified: Secondary | ICD-10-CM

## 2010-12-23 LAB — FERRITIN: Ferritin: 73 ng/mL (ref 10–291)

## 2010-12-23 LAB — COMPREHENSIVE METABOLIC PANEL
ALT: 15 U/L (ref 0–35)
AST: 17 U/L (ref 0–37)
Albumin: 3.3 g/dL — ABNORMAL LOW (ref 3.5–5.2)
Alkaline Phosphatase: 73 U/L (ref 39–117)
BUN: 14 mg/dL (ref 6–23)
CO2: 30 mEq/L (ref 19–32)
Calcium: 8.8 mg/dL (ref 8.4–10.5)
Chloride: 102 mEq/L (ref 96–112)
Creatinine, Ser: 0.75 mg/dL (ref 0.40–1.20)
Glucose, Bld: 175 mg/dL — ABNORMAL HIGH (ref 70–99)
Potassium: 3.6 mEq/L (ref 3.5–5.3)
Sodium: 137 mEq/L (ref 135–145)
Total Bilirubin: 0.2 mg/dL — ABNORMAL LOW (ref 0.3–1.2)
Total Protein: 6.6 g/dL (ref 6.0–8.3)

## 2010-12-23 LAB — CBC WITH DIFFERENTIAL/PLATELET
BASO%: 0.4 % (ref 0.0–2.0)
Basophils Absolute: 0 10*3/uL (ref 0.0–0.1)
EOS%: 1.7 % (ref 0.0–7.0)
Eosinophils Absolute: 0.1 10*3/uL (ref 0.0–0.5)
HCT: 32.4 % — ABNORMAL LOW (ref 34.8–46.6)
HGB: 10.9 g/dL — ABNORMAL LOW (ref 11.6–15.9)
LYMPH%: 25 % (ref 14.0–49.7)
MCH: 28.6 pg (ref 25.1–34.0)
MCHC: 33.7 g/dL (ref 31.5–36.0)
MCV: 84.7 fL (ref 79.5–101.0)
MONO#: 0.4 10*3/uL (ref 0.1–0.9)
MONO%: 8 % (ref 0.0–14.0)
NEUT#: 3.1 10*3/uL (ref 1.5–6.5)
NEUT%: 64.9 % (ref 38.4–76.8)
Platelets: 246 10*3/uL (ref 145–400)
RBC: 3.83 10*6/uL (ref 3.70–5.45)
RDW: 15.5 % — ABNORMAL HIGH (ref 11.2–14.5)
WBC: 4.7 10*3/uL (ref 3.9–10.3)
lymph#: 1.2 10*3/uL (ref 0.9–3.3)

## 2010-12-23 LAB — VITAMIN B12: Vitamin B-12: 306 pg/mL (ref 211–911)

## 2010-12-23 LAB — IRON AND TIBC
%SAT: 21 % (ref 20–55)
Iron: 54 ug/dL (ref 42–145)
TIBC: 262 ug/dL (ref 250–470)
UIBC: 208 ug/dL

## 2010-12-23 LAB — LACTATE DEHYDROGENASE: LDH: 163 U/L (ref 94–250)

## 2011-01-20 ENCOUNTER — Other Ambulatory Visit (HOSPITAL_COMMUNITY): Payer: Self-pay | Admitting: Oncology

## 2011-01-20 ENCOUNTER — Encounter (HOSPITAL_BASED_OUTPATIENT_CLINIC_OR_DEPARTMENT_OTHER): Payer: BC Managed Care – PPO | Admitting: Oncology

## 2011-01-20 DIAGNOSIS — D649 Anemia, unspecified: Secondary | ICD-10-CM

## 2011-01-20 LAB — CBC WITH DIFFERENTIAL/PLATELET
BASO%: 0.4 % (ref 0.0–2.0)
Basophils Absolute: 0 10*3/uL (ref 0.0–0.1)
EOS%: 2 % (ref 0.0–7.0)
Eosinophils Absolute: 0.1 10*3/uL (ref 0.0–0.5)
HCT: 33.7 % — ABNORMAL LOW (ref 34.8–46.6)
HGB: 11.2 g/dL — ABNORMAL LOW (ref 11.6–15.9)
LYMPH%: 26.9 % (ref 14.0–49.7)
MCH: 28.5 pg (ref 25.1–34.0)
MCHC: 33.3 g/dL (ref 31.5–36.0)
MCV: 85.7 fL (ref 79.5–101.0)
MONO#: 0.3 10*3/uL (ref 0.1–0.9)
MONO%: 6.2 % (ref 0.0–14.0)
NEUT#: 3.4 10*3/uL (ref 1.5–6.5)
NEUT%: 64.5 % (ref 38.4–76.8)
Platelets: 269 10*3/uL (ref 145–400)
RBC: 3.93 10*6/uL (ref 3.70–5.45)
RDW: 16 % — ABNORMAL HIGH (ref 11.2–14.5)
WBC: 5.2 10*3/uL (ref 3.9–10.3)
lymph#: 1.4 10*3/uL (ref 0.9–3.3)

## 2011-02-18 ENCOUNTER — Encounter (HOSPITAL_BASED_OUTPATIENT_CLINIC_OR_DEPARTMENT_OTHER): Payer: BC Managed Care – PPO | Admitting: Oncology

## 2011-02-18 ENCOUNTER — Other Ambulatory Visit (HOSPITAL_COMMUNITY): Payer: Self-pay | Admitting: Oncology

## 2011-02-18 DIAGNOSIS — D649 Anemia, unspecified: Secondary | ICD-10-CM

## 2011-02-18 LAB — CBC WITH DIFFERENTIAL/PLATELET
BASO%: 0.4 % (ref 0.0–2.0)
Basophils Absolute: 0 10*3/uL (ref 0.0–0.1)
EOS%: 1.5 % (ref 0.0–7.0)
Eosinophils Absolute: 0.1 10*3/uL (ref 0.0–0.5)
HCT: 37.3 % (ref 34.8–46.6)
HGB: 11.2 g/dL — ABNORMAL LOW (ref 11.6–15.9)
LYMPH%: 27.6 % (ref 14.0–49.7)
MCH: 28 pg (ref 25.1–34.0)
MCHC: 30 g/dL — ABNORMAL LOW (ref 31.5–36.0)
MCV: 93.3 fL (ref 79.5–101.0)
MONO#: 0.4 10*3/uL (ref 0.1–0.9)
MONO%: 7.7 % (ref 0.0–14.0)
NEUT#: 3.3 10*3/uL (ref 1.5–6.5)
NEUT%: 62.8 % (ref 38.4–76.8)
Platelets: 244 10*3/uL (ref 145–400)
RBC: 4 10*6/uL (ref 3.70–5.45)
RDW: 14.7 % — ABNORMAL HIGH (ref 11.2–14.5)
WBC: 5.2 10*3/uL (ref 3.9–10.3)
lymph#: 1.4 10*3/uL (ref 0.9–3.3)

## 2011-03-24 ENCOUNTER — Encounter (HOSPITAL_BASED_OUTPATIENT_CLINIC_OR_DEPARTMENT_OTHER): Payer: BC Managed Care – PPO | Admitting: Oncology

## 2011-03-24 ENCOUNTER — Other Ambulatory Visit (HOSPITAL_COMMUNITY): Payer: Self-pay | Admitting: Oncology

## 2011-03-24 DIAGNOSIS — D649 Anemia, unspecified: Secondary | ICD-10-CM

## 2011-03-24 LAB — CBC WITH DIFFERENTIAL/PLATELET
BASO%: 0.7 % (ref 0.0–2.0)
Basophils Absolute: 0 10*3/uL (ref 0.0–0.1)
EOS%: 1.7 % (ref 0.0–7.0)
Eosinophils Absolute: 0.1 10*3/uL (ref 0.0–0.5)
HCT: 32.4 % — ABNORMAL LOW (ref 34.8–46.6)
HGB: 10.7 g/dL — ABNORMAL LOW (ref 11.6–15.9)
LYMPH%: 37.9 % (ref 14.0–49.7)
MCH: 27.7 pg (ref 25.1–34.0)
MCHC: 33 g/dL (ref 31.5–36.0)
MCV: 83.9 fL (ref 79.5–101.0)
MONO#: 0.4 10*3/uL (ref 0.1–0.9)
MONO%: 7.8 % (ref 0.0–14.0)
NEUT#: 2.4 10*3/uL (ref 1.5–6.5)
NEUT%: 51.9 % (ref 38.4–76.8)
Platelets: 277 10*3/uL (ref 145–400)
RBC: 3.86 10*6/uL (ref 3.70–5.45)
RDW: 14.2 % (ref 11.2–14.5)
WBC: 4.6 10*3/uL (ref 3.9–10.3)
lymph#: 1.7 10*3/uL (ref 0.9–3.3)
nRBC: 0 % (ref 0–0)

## 2011-04-18 ENCOUNTER — Other Ambulatory Visit (HOSPITAL_COMMUNITY): Payer: Self-pay | Admitting: Oncology

## 2011-04-18 ENCOUNTER — Ambulatory Visit (HOSPITAL_BASED_OUTPATIENT_CLINIC_OR_DEPARTMENT_OTHER): Payer: BC Managed Care – PPO | Admitting: Oncology

## 2011-04-18 DIAGNOSIS — D649 Anemia, unspecified: Secondary | ICD-10-CM

## 2011-04-18 LAB — CBC WITH DIFFERENTIAL/PLATELET
BASO%: 0.4 % (ref 0.0–2.0)
Basophils Absolute: 0 10*3/uL (ref 0.0–0.1)
EOS%: 1.6 % (ref 0.0–7.0)
Eosinophils Absolute: 0.1 10*3/uL (ref 0.0–0.5)
HCT: 36.6 % (ref 34.8–46.6)
HGB: 11.9 g/dL (ref 11.6–15.9)
LYMPH%: 35.4 % (ref 14.0–49.7)
MCH: 27.6 pg (ref 25.1–34.0)
MCHC: 32.5 g/dL (ref 31.5–36.0)
MCV: 84.9 fL (ref 79.5–101.0)
MONO#: 0.5 10*3/uL (ref 0.1–0.9)
MONO%: 10.8 % (ref 0.0–14.0)
NEUT#: 2.3 10*3/uL (ref 1.5–6.5)
NEUT%: 51.8 % (ref 38.4–76.8)
Platelets: 257 10*3/uL (ref 145–400)
RBC: 4.31 10*6/uL (ref 3.70–5.45)
RDW: 14.6 % — ABNORMAL HIGH (ref 11.2–14.5)
WBC: 4.5 10*3/uL (ref 3.9–10.3)
lymph#: 1.6 10*3/uL (ref 0.9–3.3)
nRBC: 0 % (ref 0–0)

## 2011-05-21 ENCOUNTER — Other Ambulatory Visit: Payer: Self-pay | Admitting: Oncology

## 2011-05-21 DIAGNOSIS — D649 Anemia, unspecified: Secondary | ICD-10-CM | POA: Insufficient documentation

## 2011-05-21 MED ORDER — DARBEPOETIN ALFA-POLYSORBATE 300 MCG/0.6ML IJ SOLN: 300.0000 ug | INTRAMUSCULAR | Status: DC

## 2011-05-21 NOTE — Progress Notes (Signed)
abstraction

## 2011-05-30 ENCOUNTER — Encounter: Payer: Self-pay | Admitting: Oncology

## 2011-06-09 ENCOUNTER — Other Ambulatory Visit: Payer: BC Managed Care – PPO | Admitting: Lab

## 2011-06-09 ENCOUNTER — Telehealth: Payer: Self-pay | Admitting: Oncology

## 2011-06-09 ENCOUNTER — Ambulatory Visit: Payer: BC Managed Care – PPO | Admitting: Physician Assistant

## 2011-06-29 ENCOUNTER — Other Ambulatory Visit: Payer: Self-pay | Admitting: Medical Oncology

## 2011-06-29 DIAGNOSIS — D649 Anemia, unspecified: Secondary | ICD-10-CM

## 2011-07-01 ENCOUNTER — Telehealth: Payer: Self-pay | Admitting: Oncology

## 2011-07-01 NOTE — Telephone Encounter (Signed)
Pt called today re lb for 12/3. appts for 12/6 moved to 12/3 @ 1:30 pm. Also order given for lb 12/3. Per pt she now lives in [place] and 12/3 is the only day she will be in [place].

## 2011-07-04 ENCOUNTER — Ambulatory Visit: Payer: BC Managed Care – PPO

## 2011-07-04 ENCOUNTER — Other Ambulatory Visit (HOSPITAL_BASED_OUTPATIENT_CLINIC_OR_DEPARTMENT_OTHER): Payer: BC Managed Care – PPO | Admitting: Lab

## 2011-07-04 DIAGNOSIS — D649 Anemia, unspecified: Secondary | ICD-10-CM

## 2011-07-04 LAB — CBC WITH DIFFERENTIAL/PLATELET
BASO%: 0.4 % (ref 0.0–2.0)
Basophils Absolute: 0 10*3/uL (ref 0.0–0.1)
EOS%: 2 % (ref 0.0–7.0)
Eosinophils Absolute: 0.1 10*3/uL (ref 0.0–0.5)
HCT: 33.4 % — ABNORMAL LOW (ref 34.8–46.6)
HGB: 11.2 g/dL — ABNORMAL LOW (ref 11.6–15.9)
LYMPH%: 31.6 % (ref 14.0–49.7)
MCH: 28.6 pg (ref 25.1–34.0)
MCHC: 33.6 g/dL (ref 31.5–36.0)
MCV: 85.1 fL (ref 79.5–101.0)
MONO#: 0.5 10*3/uL (ref 0.1–0.9)
MONO%: 8.9 % (ref 0.0–14.0)
NEUT#: 2.9 10*3/uL (ref 1.5–6.5)
NEUT%: 57.1 % (ref 38.4–76.8)
Platelets: 254 10*3/uL (ref 145–400)
RBC: 3.93 10*6/uL (ref 3.70–5.45)
RDW: 15.5 % — ABNORMAL HIGH (ref 11.2–14.5)
WBC: 5.2 10*3/uL (ref 3.9–10.3)
lymph#: 1.6 10*3/uL (ref 0.9–3.3)

## 2011-07-04 MED ORDER — DARBEPOETIN ALFA-POLYSORBATE 500 MCG/ML IJ SOLN: 300.0000 ug | Freq: Once | INTRAMUSCULAR | Status: DC

## 2011-07-07 ENCOUNTER — Other Ambulatory Visit: Payer: BC Managed Care – PPO

## 2011-07-07 ENCOUNTER — Ambulatory Visit: Payer: BC Managed Care – PPO

## 2011-07-29 ENCOUNTER — Telehealth: Payer: Self-pay | Admitting: *Deleted

## 2011-09-19 ENCOUNTER — Ambulatory Visit: Payer: BC Managed Care – PPO | Admitting: Oncology

## 2011-09-19 ENCOUNTER — Other Ambulatory Visit: Payer: BC Managed Care – PPO | Admitting: Lab

## 2011-11-02 ENCOUNTER — Other Ambulatory Visit: Payer: Self-pay | Admitting: *Deleted

## 2012-01-31 DIAGNOSIS — G4730 Sleep apnea, unspecified: Secondary | ICD-10-CM | POA: Insufficient documentation

## 2012-05-22 ENCOUNTER — Telehealth: Payer: Self-pay

## 2012-05-22 NOTE — Telephone Encounter (Signed)
Authorization to release health information received from [facility] and forwarded to medical records

## 2013-11-13 ENCOUNTER — Emergency Department (HOSPITAL_BASED_OUTPATIENT_CLINIC_OR_DEPARTMENT_OTHER)
Admission: EM | Admit: 2013-11-13 | Discharge: 2013-11-13 | Disposition: A | Discharge status: Home / Self Care | Payer: BC Managed Care – PPO | Attending: Emergency Medicine | Admitting: Emergency Medicine

## 2013-11-13 ENCOUNTER — Encounter (HOSPITAL_BASED_OUTPATIENT_CLINIC_OR_DEPARTMENT_OTHER): Payer: Self-pay | Admitting: Emergency Medicine

## 2013-11-13 ENCOUNTER — Emergency Department (HOSPITAL_BASED_OUTPATIENT_CLINIC_OR_DEPARTMENT_OTHER): Payer: BC Managed Care – PPO

## 2013-11-13 DIAGNOSIS — E785 Hyperlipidemia, unspecified: Secondary | ICD-10-CM | POA: Insufficient documentation

## 2013-11-13 DIAGNOSIS — E119 Type 2 diabetes mellitus without complications: Secondary | ICD-10-CM | POA: Insufficient documentation

## 2013-11-13 DIAGNOSIS — Z79899 Other long term (current) drug therapy: Secondary | ICD-10-CM | POA: Insufficient documentation

## 2013-11-13 DIAGNOSIS — Z794 Long term (current) use of insulin: Secondary | ICD-10-CM | POA: Insufficient documentation

## 2013-11-13 DIAGNOSIS — IMO0002 Reserved for concepts with insufficient information to code with codable children: Secondary | ICD-10-CM | POA: Insufficient documentation

## 2013-11-13 DIAGNOSIS — D518 Other vitamin B12 deficiency anemias: Secondary | ICD-10-CM | POA: Insufficient documentation

## 2013-11-13 DIAGNOSIS — I10 Essential (primary) hypertension: Secondary | ICD-10-CM | POA: Insufficient documentation

## 2013-11-13 DIAGNOSIS — Z7902 Long term (current) use of antithrombotics/antiplatelets: Secondary | ICD-10-CM | POA: Insufficient documentation

## 2013-11-13 DIAGNOSIS — M5416 Radiculopathy, lumbar region: Secondary | ICD-10-CM

## 2013-11-13 MED ORDER — CYCLOBENZAPRINE HCL 10 MG PO TABS: 10.0000 mg | ORAL_TABLET | Freq: Three times a day (TID) | ORAL | Status: DC

## 2013-11-13 MED ORDER — FENTANYL CITRATE 0.05 MG/ML IJ SOLN
100.0000 ug | Freq: Once | INTRAMUSCULAR | Status: AC
  Administered 2013-11-13: 100 ug via INTRAMUSCULAR
  Filled 2013-11-13: qty 2

## 2013-11-13 MED ORDER — HYDROCODONE-ACETAMINOPHEN 5-325 MG PO TABS: 1.0000 | ORAL_TABLET | Freq: Four times a day (QID) | ORAL | Status: DC | PRN

## 2013-11-13 NOTE — ED Notes (Signed)
MD at bedside. 

## 2013-11-13 NOTE — ED Notes (Signed)
Reports pain when walking and "tightening" feeling to right hip, describes as cramps and tingling down the right leg

## 2013-11-13 NOTE — ED Provider Notes (Addendum)
CSN: [id number]     Arrival date & time 11/13/13  0121 History   First MD Initiated Contact with Patient 11/13/13 [phone]     Chief Complaint  Patient presents with  . Hip Pain     (Consider location/radiation/quality/duration/timing/severity/associated sxs/prior Treatment) HPI This is a 63 year old female who developed right sided low back pain yesterday afternoon after sitting for 3 hours. The pain is moderate to severe. She describes it as a spasm or gripping pain. She denies any fall or other trauma. The pain radiates down her leg at about the L3 dermatome. It is worse with flexing or twisting of her lower back and with movement of the right hip. There is no associated numbness or weakness. She denies bowel or bladder changes. She has no history of similar pain.  Past Medical History  Diagnosis Date  . Anemia   . Anemia, vitamin B12 deficiency   . Obesity   . Fibromyalgia   . Dyslipidemia   . Hypertension   . Diabetes mellitus    Past Surgical History  Procedure Laterality Date  . Gastric bypass  1981  . Cholecystectomy      laproscopic  . Abdominal hysterectomy  2007  . Therapuetic abortion  1986    associated with C-section   History reviewed. No pertinent family history. History  Substance Use Topics  . Smoking status: Never Smoker   . Smokeless tobacco: Not on file  . Alcohol Use: No   OB History   Grav Para Term Preterm Abortions TAB SAB Ect Mult Living                 Review of Systems  All other systems reviewed and are negative.  Allergies  Demerol and Novocain  Home Medications   Prior to Admission medications   Medication Sig Start Date End Date Taking? Authorizing Provider  clopidogrel (PLAVIX) 75 MG tablet Take 75 mg by mouth daily.   01/24/10  Yes Historical Provider, MD  ergocalciferol (VITAMIN D2) 50000 UNITS capsule Take 50,000 Units by mouth once a week.     Yes Historical Provider, MD  hydrochlorothiazide (HYDRODIURIL) 25 MG tablet Take 25 mg  by mouth daily.     Yes Historical Provider, MD  insulin glargine (LANTUS) 100 UNIT/ML injection Inject 20 Units into the skin at bedtime.     Yes Historical Provider, MD  multivitamin [facility](THERAGRAN) per tablet Take 1 tablet by mouth daily.     Yes Historical Provider, MD  OMEGA 3 1200 MG CAPS Take 1 capsule by mouth daily.     Yes Historical Provider, MD  ramipril (ALTACE) 10 MG capsule Take 10 mg by mouth daily.     Yes Historical Provider, MD  amitriptyline (ELAVIL) 10 MG tablet Take 20 mg by mouth at bedtime.      Historical Provider, MD  b complex vitamins tablet Take 1 tablet by mouth daily.      Historical Provider, MD  Bromocriptine Mesylate (CYCLOSET PO) Take 0.48 mg by mouth daily.      Historical Provider, MD  ezetimibe (ZETIA) 10 MG tablet Take 10 mg by mouth daily.      Historical Provider, MD  ferrous sulfate 325 (65 FE) MG tablet Take 325 mg by mouth 2 (two) times daily.      Historical Provider, MD  Pitavastatin Calcium (LIVALO) 2 MG TABS Take 2 mg by mouth daily.      Historical Provider, MD   BP 144/69  Pulse 56  Temp(Src) 98.2  F (36.8 C) (Oral)  Resp 20  Ht 5\' 6"  (1.676 m)  Wt 250 lb (113.399 kg)  BMI 40.37 kg/m2  SpO2 100%  Physical Exam General: Well-developed, well-nourished female in no acute distress; appearance consistent with age of record HENT: normocephalic; atraumatic Eyes: pupils equal, round and reactive to light; extraocular muscles intact;  Arcus senilis bilaterally Neck: supple Heart: regular rate and rhythm; no murmurs, rubs or gallops Lungs: clear to auscultation bilaterally Abdomen: soft; nondistended; nontender; bowel sounds present Back: Right paralumbar tenderness in the sitting upright position but not in the lying supine position; pain on movement of the lower back; positive straight leg raise on the right at about 20 Extremities: No deformity; full range of motion except right hip due to pain; pulses normal Neurologic: Awake, alert and  oriented; motor function intact in all extremities and symmetric; no facial droop Skin: Warm and dry Psychiatric: Normal mood and affect    ED Course  Procedures (including critical care time)   MDM  The patient was advised that narcotic pain medication can cause constipation and drowsiness. Flexeril likewise can cause drowsiness. [place] [place] [facility] [place] but she continues to see him regularly. She was advised that if symptoms persist she may require an MRI.    Hanley SeamenJohn L Chayce Rullo, MD 11/13/13 [id number]  Hanley SeamenJohn L Elivia Robotham, MD 11/13/13 [phone]

## 2013-11-13 NOTE — Discharge Instructions (Signed)

## 2013-11-13 NOTE — ED Notes (Signed)
Right hip pain since Tuesday afternoon, no known injury

## 2014-05-06 ENCOUNTER — Other Ambulatory Visit: Payer: Self-pay | Admitting: Physical Medicine and Rehabilitation

## 2014-05-06 DIAGNOSIS — M5417 Radiculopathy, lumbosacral region: Secondary | ICD-10-CM

## 2014-05-06 DIAGNOSIS — M5117 Intervertebral disc disorders with radiculopathy, lumbosacral region: Secondary | ICD-10-CM

## 2014-05-11 ENCOUNTER — Ambulatory Visit
Admission: RE | Admit: 2014-05-11 | Discharge: 2014-05-11 | Disposition: A | Discharge status: Home / Self Care | Payer: BC Managed Care – PPO | Source: Ambulatory Visit | Attending: Physical Medicine and Rehabilitation | Admitting: Physical Medicine and Rehabilitation

## 2014-05-11 DIAGNOSIS — M5117 Intervertebral disc disorders with radiculopathy, lumbosacral region: Secondary | ICD-10-CM

## 2014-05-11 DIAGNOSIS — M5417 Radiculopathy, lumbosacral region: Secondary | ICD-10-CM

## 2014-05-28 ENCOUNTER — Other Ambulatory Visit: Payer: Self-pay | Admitting: Nurse Practitioner

## 2014-10-14 ENCOUNTER — Other Ambulatory Visit (HOSPITAL_COMMUNITY)
Admission: RE | Admit: 2014-10-14 | Discharge: 2014-10-14 | Disposition: A | Discharge status: Home / Self Care | Payer: BC Managed Care – PPO | Source: Ambulatory Visit | Attending: Obstetrics and Gynecology | Admitting: Obstetrics and Gynecology

## 2014-10-14 ENCOUNTER — Other Ambulatory Visit: Payer: Self-pay | Admitting: Obstetrics and Gynecology

## 2014-10-14 DIAGNOSIS — Z1151 Encounter for screening for human papillomavirus (HPV): Secondary | ICD-10-CM | POA: Diagnosis present

## 2014-10-14 DIAGNOSIS — Z01419 Encounter for gynecological examination (general) (routine) without abnormal findings: Secondary | ICD-10-CM | POA: Diagnosis present

## 2014-10-15 LAB — CYTOLOGY - PAP

## 2015-04-04 ENCOUNTER — Emergency Department (HOSPITAL_BASED_OUTPATIENT_CLINIC_OR_DEPARTMENT_OTHER)
Admission: EM | Admit: 2015-04-04 | Discharge: 2015-04-04 | Disposition: A | Discharge status: Home / Self Care | Payer: BC Managed Care – PPO | Attending: Emergency Medicine | Admitting: Emergency Medicine

## 2015-04-04 ENCOUNTER — Encounter (HOSPITAL_BASED_OUTPATIENT_CLINIC_OR_DEPARTMENT_OTHER): Payer: Self-pay | Admitting: *Deleted

## 2015-04-04 ENCOUNTER — Emergency Department (HOSPITAL_BASED_OUTPATIENT_CLINIC_OR_DEPARTMENT_OTHER): Payer: BC Managed Care – PPO

## 2015-04-04 DIAGNOSIS — Z79899 Other long term (current) drug therapy: Secondary | ICD-10-CM | POA: Diagnosis not present

## 2015-04-04 DIAGNOSIS — S3991XA Unspecified injury of abdomen, initial encounter: Secondary | ICD-10-CM | POA: Insufficient documentation

## 2015-04-04 DIAGNOSIS — D519 Vitamin B12 deficiency anemia, unspecified: Secondary | ICD-10-CM | POA: Insufficient documentation

## 2015-04-04 DIAGNOSIS — Z8673 Personal history of transient ischemic attack (TIA), and cerebral infarction without residual deficits: Secondary | ICD-10-CM | POA: Insufficient documentation

## 2015-04-04 DIAGNOSIS — E119 Type 2 diabetes mellitus without complications: Secondary | ICD-10-CM | POA: Diagnosis not present

## 2015-04-04 DIAGNOSIS — I10 Essential (primary) hypertension: Secondary | ICD-10-CM | POA: Diagnosis not present

## 2015-04-04 DIAGNOSIS — S40012A Contusion of left shoulder, initial encounter: Secondary | ICD-10-CM | POA: Insufficient documentation

## 2015-04-04 DIAGNOSIS — Z794 Long term (current) use of insulin: Secondary | ICD-10-CM | POA: Insufficient documentation

## 2015-04-04 DIAGNOSIS — Y998 Other external cause status: Secondary | ICD-10-CM | POA: Diagnosis not present

## 2015-04-04 DIAGNOSIS — T148XXA Other injury of unspecified body region, initial encounter: Secondary | ICD-10-CM

## 2015-04-04 DIAGNOSIS — E785 Hyperlipidemia, unspecified: Secondary | ICD-10-CM | POA: Insufficient documentation

## 2015-04-04 DIAGNOSIS — M797 Fibromyalgia: Secondary | ICD-10-CM | POA: Diagnosis not present

## 2015-04-04 DIAGNOSIS — Y92410 Unspecified street and highway as the place of occurrence of the external cause: Secondary | ICD-10-CM | POA: Insufficient documentation

## 2015-04-04 DIAGNOSIS — S199XXA Unspecified injury of neck, initial encounter: Secondary | ICD-10-CM | POA: Diagnosis not present

## 2015-04-04 DIAGNOSIS — Z7902 Long term (current) use of antithrombotics/antiplatelets: Secondary | ICD-10-CM | POA: Insufficient documentation

## 2015-04-04 DIAGNOSIS — Y9389 Activity, other specified: Secondary | ICD-10-CM | POA: Insufficient documentation

## 2015-04-04 DIAGNOSIS — E669 Obesity, unspecified: Secondary | ICD-10-CM | POA: Insufficient documentation

## 2015-04-04 DIAGNOSIS — R109 Unspecified abdominal pain: Secondary | ICD-10-CM

## 2015-04-04 HISTORY — DX: Cerebral infarction, unspecified: I63.9

## 2015-04-04 LAB — CBC
HCT: 32.8 % — ABNORMAL LOW (ref 36.0–46.0)
Hemoglobin: 10.7 g/dL — ABNORMAL LOW (ref 12.0–15.0)
MCH: 27.9 pg (ref 26.0–34.0)
MCHC: 32.6 g/dL (ref 30.0–36.0)
MCV: 85.6 fL (ref 78.0–100.0)
Platelets: 278 10*3/uL (ref 150–400)
RBC: 3.83 MIL/uL — ABNORMAL LOW (ref 3.87–5.11)
RDW: 13.8 % (ref 11.5–15.5)
WBC: 6.6 10*3/uL (ref 4.0–10.5)

## 2015-04-04 LAB — BASIC METABOLIC PANEL
Anion gap: 7 (ref 5–15)
BUN: 13 mg/dL (ref 6–20)
CO2: 24 mmol/L (ref 22–32)
Calcium: 8.7 mg/dL — ABNORMAL LOW (ref 8.9–10.3)
Chloride: 106 mmol/L (ref 101–111)
Creatinine, Ser: 0.95 mg/dL (ref 0.44–1.00)
GFR calc Af Amer: >60 mL/min (ref >60)
GFR calc non Af Amer: >60 mL/min (ref >60)
Glucose, Bld: 173 mg/dL — ABNORMAL HIGH (ref 65–99)
Potassium: 3.8 mmol/L (ref 3.5–5.1)
Sodium: 137 mmol/L (ref 135–145)

## 2015-04-04 MED ORDER — OXYCODONE-ACETAMINOPHEN 5-325 MG PO TABS
2.0000 | ORAL_TABLET | Freq: Once | ORAL | Status: AC
  Administered 2015-04-04 (×2): 1 via ORAL
  Filled 2015-04-04: qty 2

## 2015-04-04 MED ORDER — DIAZEPAM 5 MG PO TABS: 5.0000 mg | ORAL_TABLET | Freq: Two times a day (BID) | ORAL | Status: DC | PRN

## 2015-04-04 MED ORDER — NAPROXEN 500 MG PO TABS: 500.0000 mg | ORAL_TABLET | Freq: Two times a day (BID) | ORAL | Status: DC

## 2015-04-04 MED ORDER — IOHEXOL 300 MG/ML  SOLN
100.0000 mL | Freq: Once | INTRAMUSCULAR | Status: AC | PRN
  Administered 2015-04-04: 100 mL via INTRAVENOUS

## 2015-04-04 MED ORDER — DIAZEPAM 5 MG PO TABS
5.0000 mg | ORAL_TABLET | Freq: Once | ORAL | Status: AC
  Administered 2015-04-04: 5 mg via ORAL
  Filled 2015-04-04: qty 1

## 2015-04-04 MED ORDER — HYDROCODONE-ACETAMINOPHEN 5-325 MG PO TABS: 1.0000 | ORAL_TABLET | ORAL | Status: DC | PRN
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
## 2015-04-04 NOTE — ED Notes (Signed)
Patient assisted into a gown and given a blanket, Patient in bed with husband at bedside.

## 2016-01-02 ENCOUNTER — Encounter (HOSPITAL_BASED_OUTPATIENT_CLINIC_OR_DEPARTMENT_OTHER): Payer: Self-pay | Admitting: Emergency Medicine

## 2016-01-02 ENCOUNTER — Observation Stay (HOSPITAL_COMMUNITY): Payer: BC Managed Care – PPO

## 2016-01-02 ENCOUNTER — Inpatient Hospital Stay (HOSPITAL_BASED_OUTPATIENT_CLINIC_OR_DEPARTMENT_OTHER)
Admission: EM | Admit: 2016-01-02 | Discharge: 2016-01-06 | DRG: 041 | Disposition: A | Discharge status: Home / Self Care | Payer: BC Managed Care – PPO | Attending: Internal Medicine | Admitting: Internal Medicine

## 2016-01-02 ENCOUNTER — Emergency Department (HOSPITAL_BASED_OUTPATIENT_CLINIC_OR_DEPARTMENT_OTHER): Payer: BC Managed Care – PPO

## 2016-01-02 DIAGNOSIS — I639 Cerebral infarction, unspecified: Principal | ICD-10-CM

## 2016-01-02 DIAGNOSIS — F418 Other specified anxiety disorders: Secondary | ICD-10-CM

## 2016-01-02 DIAGNOSIS — I10 Essential (primary) hypertension: Secondary | ICD-10-CM

## 2016-01-02 DIAGNOSIS — R29703 NIHSS score 3: Secondary | ICD-10-CM | POA: Diagnosis present

## 2016-01-02 DIAGNOSIS — R29898 Other symptoms and signs involving the musculoskeletal system: Secondary | ICD-10-CM

## 2016-01-02 DIAGNOSIS — E1165 Type 2 diabetes mellitus with hyperglycemia: Secondary | ICD-10-CM | POA: Insufficient documentation

## 2016-01-02 DIAGNOSIS — Z8673 Personal history of transient ischemic attack (TIA), and cerebral infarction without residual deficits: Secondary | ICD-10-CM

## 2016-01-02 DIAGNOSIS — Z794 Long term (current) use of insulin: Secondary | ICD-10-CM

## 2016-01-02 DIAGNOSIS — E669 Obesity, unspecified: Secondary | ICD-10-CM | POA: Diagnosis present

## 2016-01-02 DIAGNOSIS — Z7902 Long term (current) use of antithrombotics/antiplatelets: Secondary | ICD-10-CM

## 2016-01-02 DIAGNOSIS — I63412 Cerebral infarction due to embolism of left middle cerebral artery: Secondary | ICD-10-CM | POA: Insufficient documentation

## 2016-01-02 DIAGNOSIS — G459 Transient cerebral ischemic attack, unspecified: Secondary | ICD-10-CM | POA: Diagnosis not present

## 2016-01-02 DIAGNOSIS — I6330 Cerebral infarction due to thrombosis of unspecified cerebral artery: Secondary | ICD-10-CM

## 2016-01-02 DIAGNOSIS — Z79899 Other long term (current) drug therapy: Secondary | ICD-10-CM

## 2016-01-02 DIAGNOSIS — M797 Fibromyalgia: Secondary | ICD-10-CM | POA: Diagnosis present

## 2016-01-02 DIAGNOSIS — D649 Anemia, unspecified: Secondary | ICD-10-CM | POA: Diagnosis not present

## 2016-01-02 DIAGNOSIS — I6350 Cerebral infarction due to unspecified occlusion or stenosis of unspecified cerebral artery: Secondary | ICD-10-CM | POA: Diagnosis not present

## 2016-01-02 DIAGNOSIS — R200 Anesthesia of skin: Secondary | ICD-10-CM | POA: Diagnosis not present

## 2016-01-02 DIAGNOSIS — Z9884 Bariatric surgery status: Secondary | ICD-10-CM

## 2016-01-02 DIAGNOSIS — E1159 Type 2 diabetes mellitus with other circulatory complications: Secondary | ICD-10-CM | POA: Diagnosis present

## 2016-01-02 DIAGNOSIS — Z791 Long term (current) use of non-steroidal anti-inflammatories (NSAID): Secondary | ICD-10-CM

## 2016-01-02 DIAGNOSIS — E1169 Type 2 diabetes mellitus with other specified complication: Secondary | ICD-10-CM | POA: Diagnosis present

## 2016-01-02 DIAGNOSIS — E785 Hyperlipidemia, unspecified: Secondary | ICD-10-CM

## 2016-01-02 DIAGNOSIS — Z6841 Body Mass Index (BMI) 40.0 and over, adult: Secondary | ICD-10-CM

## 2016-01-02 HISTORY — DX: Other specified anxiety disorders: F41.8

## 2016-01-02 LAB — CBG MONITORING, ED: Glucose-Capillary: 199 mg/dL — ABNORMAL HIGH (ref 65–99)

## 2016-01-02 LAB — COMPREHENSIVE METABOLIC PANEL
ALT: 20 U/L (ref 14–54)
AST: 24 U/L (ref 15–41)
Albumin: 3.5 g/dL (ref 3.5–5.0)
Alkaline Phosphatase: 95 U/L (ref 38–126)
Anion gap: 5 (ref 5–15)
BUN: 17 mg/dL (ref 6–20)
CO2: 27 mmol/L (ref 22–32)
Calcium: 8.9 mg/dL (ref 8.9–10.3)
Chloride: 105 mmol/L (ref 101–111)
Creatinine, Ser: 1.04 mg/dL — ABNORMAL HIGH (ref 0.44–1.00)
GFR calc Af Amer: >60 mL/min (ref >60)
GFR calc non Af Amer: 56 mL/min — ABNORMAL LOW (ref >60)
Glucose, Bld: 223 mg/dL — ABNORMAL HIGH (ref 65–99)
Potassium: 4.4 mmol/L (ref 3.5–5.1)
Sodium: 137 mmol/L (ref 135–145)
Total Bilirubin: 0.4 mg/dL (ref 0.3–1.2)
Total Protein: 7 g/dL (ref 6.5–8.1)

## 2016-01-02 LAB — RAPID URINE DRUG SCREEN, HOSP PERFORMED
Amphetamines: NOT DETECTED
Barbiturates: NOT DETECTED
Benzodiazepines: NOT DETECTED
Cocaine: NOT DETECTED
Opiates: NOT DETECTED
Tetrahydrocannabinol: NOT DETECTED

## 2016-01-02 LAB — DIFFERENTIAL
Basophils Absolute: 0 10*3/uL (ref 0.0–0.1)
Basophils Relative: 0 %
Eosinophils Absolute: 0.1 10*3/uL (ref 0.0–0.7)
Eosinophils Relative: 1 %
Lymphocytes Relative: 36 %
Lymphs Abs: 2.1 10*3/uL (ref 0.7–4.0)
Monocytes Absolute: 0.7 10*3/uL (ref 0.1–1.0)
Monocytes Relative: 11 %
Neutro Abs: 3 10*3/uL (ref 1.7–7.7)
Neutrophils Relative %: 52 %

## 2016-01-02 LAB — CBC
HCT: 33.9 % — ABNORMAL LOW (ref 36.0–46.0)
Hemoglobin: 11.1 g/dL — ABNORMAL LOW (ref 12.0–15.0)
MCH: 27.3 pg (ref 26.0–34.0)
MCHC: 32.7 g/dL (ref 30.0–36.0)
MCV: 83.3 fL (ref 78.0–100.0)
Platelets: 293 10*3/uL (ref 150–400)
RBC: 4.07 MIL/uL (ref 3.87–5.11)
RDW: 15.1 % (ref 11.5–15.5)
WBC: 5.9 10*3/uL (ref 4.0–10.5)

## 2016-01-02 LAB — ETHANOL: Alcohol, Ethyl (B): <5 mg/dL (ref <5)

## 2016-01-02 LAB — URINALYSIS, ROUTINE W REFLEX MICROSCOPIC
Bilirubin Urine: NEGATIVE
Glucose, UA: 100 mg/dL — AB
Hgb urine dipstick: NEGATIVE
Ketones, ur: NEGATIVE mg/dL
Leukocytes, UA: NEGATIVE
Nitrite: NEGATIVE
Protein, ur: NEGATIVE mg/dL
Specific Gravity, Urine: 1.016 (ref 1.005–1.030)
pH: 5 (ref 5.0–8.0)

## 2016-01-02 LAB — GLUCOSE, CAPILLARY: Glucose-Capillary: 191 mg/dL — ABNORMAL HIGH (ref 65–99)

## 2016-01-02 LAB — PROTIME-INR
INR: 1 (ref 0.00–1.49)
Prothrombin Time: 13.4 seconds (ref 11.6–15.2)

## 2016-01-02 LAB — TROPONIN I: Troponin I: <0.03 ng/mL (ref <0.031)

## 2016-01-02 LAB — APTT: aPTT: 29 seconds (ref 24–37)

## 2016-01-02 MED ORDER — AMITRIPTYLINE HCL 10 MG PO TABS
5.0000 mg | ORAL_TABLET | Freq: Every evening | ORAL | Status: DC | PRN
  Filled 2016-01-02: qty 0.5

## 2016-01-02 MED ORDER — CYCLOBENZAPRINE HCL 10 MG PO TABS: 10.0000 mg | ORAL_TABLET | Freq: Three times a day (TID) | ORAL | Status: DC

## 2016-01-02 MED ORDER — ATORVASTATIN CALCIUM 10 MG PO TABS
20.0000 mg | ORAL_TABLET | Freq: Every day | ORAL | Status: DC
  Administered 2016-01-02: 20 mg via ORAL
  Filled 2016-01-02: qty 2

## 2016-01-02 MED ORDER — B COMPLEX-C PO TABS
1.0000 | ORAL_TABLET | Freq: Every day | ORAL | Status: DC
  Administered 2016-01-02 – 2016-01-06 (×5): 1 via ORAL
  Filled 2016-01-02 (×5): qty 1

## 2016-01-02 MED ORDER — PHENYLTOLOXAMINE-ACETAMINOPHEN 50-500 MG PO TABS: 1.0000 | ORAL_TABLET | Freq: Every evening | ORAL | Status: DC | PRN

## 2016-01-02 MED ORDER — INSULIN DETEMIR 100 UNIT/ML ~~LOC~~ SOLN
18.0000 [IU] | Freq: Every day | SUBCUTANEOUS | Status: DC
  Administered 2016-01-03: 18 [IU] via SUBCUTANEOUS
  Filled 2016-01-02 (×2): qty 0.18

## 2016-01-02 MED ORDER — STROKE: EARLY STAGES OF RECOVERY BOOK
Freq: Once | Status: AC
  Administered 2016-01-03
  Filled 2016-01-02: qty 1

## 2016-01-02 MED ORDER — NAPROXEN 250 MG PO TABS: 500.0000 mg | ORAL_TABLET | Freq: Two times a day (BID) | ORAL | Status: DC | PRN

## 2016-01-02 MED ORDER — HYDROCODONE-ACETAMINOPHEN 5-325 MG PO TABS
1.0000 | ORAL_TABLET | ORAL | Status: DC | PRN
  Administered 2016-01-03 – 2016-01-05 (×3): 2 via ORAL
  Filled 2016-01-02 (×3): qty 2

## 2016-01-02 MED ORDER — OMEGA 3 1200 MG PO CAPS: 1200.0000 mg | ORAL_CAPSULE | Freq: Every day | ORAL | Status: DC

## 2016-01-02 MED ORDER — DIAZEPAM 5 MG PO TABS
5.0000 mg | ORAL_TABLET | Freq: Two times a day (BID) | ORAL | Status: DC | PRN
  Administered 2016-01-05: 5 mg via ORAL
  Filled 2016-01-02: qty 1

## 2016-01-02 MED ORDER — INSULIN ASPART 100 UNIT/ML ~~LOC~~ SOLN
0.0000 [IU] | Freq: Three times a day (TID) | SUBCUTANEOUS | Status: DC
  Administered 2016-01-03 (×3): 2 [IU] via SUBCUTANEOUS
  Administered 2016-01-04: 3 [IU] via SUBCUTANEOUS
  Administered 2016-01-04: 2 [IU] via SUBCUTANEOUS
  Administered 2016-01-05: 3 [IU] via SUBCUTANEOUS
  Administered 2016-01-05: 5 [IU] via SUBCUTANEOUS
  Administered 2016-01-05 – 2016-01-06 (×2): 3 [IU] via SUBCUTANEOUS
  Administered 2016-01-06: 2 [IU] via SUBCUTANEOUS

## 2016-01-02 MED ORDER — NAPROXEN 250 MG PO TABS: 500.0000 mg | ORAL_TABLET | Freq: Two times a day (BID) | ORAL | Status: DC

## 2016-01-02 MED ORDER — CLOPIDOGREL BISULFATE 75 MG PO TABS
75.0000 mg | ORAL_TABLET | Freq: Every day | ORAL | Status: DC
  Administered 2016-01-03: 75 mg via ORAL
  Filled 2016-01-02: qty 1

## 2016-01-02 MED ORDER — SODIUM CHLORIDE 0.9 % IV SOLN
INTRAVENOUS | Status: DC
  Administered 2016-01-02: via INTRAVENOUS

## 2016-01-02 MED ORDER — OMEGA-3-ACID ETHYL ESTERS 1 G PO CAPS
1.0000 g | ORAL_CAPSULE | Freq: Every day | ORAL | Status: DC
Start: 2016-01-02 — End: 2016-01-06
  Administered 2016-01-02 – 2016-01-05 (×4): 1 g via ORAL
  Filled 2016-01-02 (×4): qty 1

## 2016-01-02 MED ORDER — HYDRALAZINE HCL 20 MG/ML IJ SOLN: 5.0000 mg | INTRAMUSCULAR | Status: DC | PRN

## 2016-01-02 MED ORDER — ONDANSETRON HCL 4 MG/2ML IJ SOLN: 4.0000 mg | Freq: Three times a day (TID) | INTRAMUSCULAR | Status: DC | PRN

## 2016-01-02 MED ORDER — B COMPLEX PO TABS: 1.0000 | ORAL_TABLET | Freq: Every day | ORAL | Status: DC

## 2016-01-02 MED ORDER — ENOXAPARIN SODIUM 40 MG/0.4ML ~~LOC~~ SOLN
40.0000 mg | Freq: Every day | SUBCUTANEOUS | Status: DC
  Administered 2016-01-02 – 2016-01-05 (×4): 40 mg via SUBCUTANEOUS
  Filled 2016-01-02 (×4): qty 0.4

## 2016-01-02 MED ORDER — RAMIPRIL 5 MG PO CAPS
10.0000 mg | ORAL_CAPSULE | Freq: Every day | ORAL | Status: DC
  Administered 2016-01-03 – 2016-01-06 (×4): 10 mg via ORAL
  Filled 2016-01-02 (×4): qty 2

## 2016-01-02 MED ORDER — CYCLOBENZAPRINE HCL 10 MG PO TABS: 10.0000 mg | ORAL_TABLET | Freq: Three times a day (TID) | ORAL | Status: DC | PRN

## 2016-01-02 MED ORDER — SENNOSIDES-DOCUSATE SODIUM 8.6-50 MG PO TABS: 1.0000 | ORAL_TABLET | Freq: Every evening | ORAL | Status: DC | PRN

## 2016-01-02 MED ORDER — ADULT MULTIVITAMIN W/MINERALS CH
1.0000 | ORAL_TABLET | Freq: Every day | ORAL | Status: DC
  Administered 2016-01-03 – 2016-01-06 (×4): 1 via ORAL
  Filled 2016-01-02 (×4): qty 1

## 2016-01-02 MED ORDER — EZETIMIBE 10 MG PO TABS
10.0000 mg | ORAL_TABLET | Freq: Every day | ORAL | Status: DC
Start: 2016-01-02 — End: 2016-01-06
  Administered 2016-01-02 – 2016-01-06 (×5): 10 mg via ORAL
  Filled 2016-01-02 (×5): qty 1

## 2016-01-02 MED ORDER — BUPROPION HCL ER (XL) 150 MG PO TB24
150.0000 mg | ORAL_TABLET | ORAL | Status: DC
  Administered 2016-01-03 – 2016-01-05 (×2): 150 mg via ORAL
  Filled 2016-01-02 (×2): qty 1

## 2016-01-02 NOTE — ED Notes (Signed)
MRI sending transporter for scan

## 2016-01-02 NOTE — ED Notes (Signed)
Patient has history of stroke x 2- the patient reports that about 30 minutes ago she was sitting and eating and she could no longer hold her fork. Patient patients that she continues to have weakness and heaviness to her right arm

## 2016-01-02 NOTE — ED Notes (Signed)
Pt taken to MRI  

## 2016-01-02 NOTE — Progress Notes (Signed)
Arrived from ED. Alert and orientedx4. Denies any pain. Able to have good grips with right hand. Oriented to room. Call light within reach.

## 2016-01-02 NOTE — ED Provider Notes (Signed)
CSN: [id number]     Arrival date & time 01/02/16  1736 History   By signing my name below, I, Hollace Hayward, attest that this documentation has been prepared under the direction and in the presence of Laurence Spates, MD.  Electronically Signed: Hollace Hayward, ED Scribe. 01/02/2016. 5:57 PM.   Chief Complaint  Patient presents with  . Extremity Weakness   The history is provided by the patient, the spouse and a relative. No language interpreter was used.   HPI Comments: Catherine Bautista is a 65 y.o. female with a PMHx of strokes (2 episodes), DM, HTN, and Anemia who presents to the Emergency Department complaining of sudden onset, gradually worsening unilateral right-sided arm weakness that began 1 hour PTA, ~5pm. Pt has associated tingling and heaviness in her right arm. Pt reports that she had difficulty balling her fist or raising her arm PTA which led her to come into the ED. Pt reports that her symptoms feel similar to her previous hx of strokes. She states that her Neurologist noted that she has more left sided weakness after her first stroke. She reported that two days ago that she felt "off" and attributed it to dehydration. After drinking water the feeling resolved. She endorses tingling in her R arm. Pt denies other extremity numbness, speech difficulty, HA, vision changes, breathing problems, R leg weakness or chest pain.   Past Medical History  Diagnosis Date  . Anemia   . Anemia, vitamin B12 deficiency   . Obesity   . Fibromyalgia   . Dyslipidemia   . Hypertension   . Diabetes mellitus   . Stroke [facility])    Past Surgical History  Procedure Laterality Date  . Gastric bypass  1981  . Cholecystectomy      laproscopic  . Abdominal hysterectomy  2007  . Therapuetic abortion  1986    associated with C-section   History reviewed. No pertinent family history. Social History  Substance Use Topics  . Smoking status: Never Smoker   . Smokeless tobacco: Never Used  . Alcohol Use: No    OB History    No data available     Review of Systems A complete 10 system review of systems was obtained and all systems are negative except as noted in the HPI and PMH.   Allergies  Demerol and Novocain  Home Medications   Prior to Admission medications   Medication Sig Start Date End Date Taking? Authorizing Provider  atorvastatin (LIPITOR) 20 MG tablet Take 20 mg by mouth daily.   Yes Historical Provider, MD  Insulin Detemir (LEVEMIR FLEXPEN [place]) Inject 28 Units into the skin.   Yes Historical Provider, MD  sitaGLIPtin (JANUVIA) 50 MG tablet Take 50 mg by mouth daily.   Yes Historical Provider, MD  amitriptyline (ELAVIL) 10 MG tablet Take 20 mg by mouth at bedtime.      Historical Provider, MD  b complex vitamins tablet Take 1 tablet by mouth daily.      Historical Provider, MD  Bromocriptine Mesylate (CYCLOSET PO) Take 0.48 mg by mouth daily.      Historical Provider, MD  clopidogrel (PLAVIX) 75 MG tablet Take 75 mg by mouth daily.   01/24/10   Historical Provider, MD  cyclobenzaprine (FLEXERIL) 10 MG tablet Take 1 tablet (10 mg total) by mouth 3 (three) times daily. 11/13/13   John Molpus, MD  diazepam (VALIUM) 5 MG tablet Take 1 tablet (5 mg total) by mouth every 12 (twelve) hours as needed  for muscle spasms. 04/04/15   Kathrynn Speed, PA-C  ergocalciferol (VITAMIN D2) 50000 UNITS capsule Take 50,000 Units by mouth once a week.      Historical Provider, MD  ezetimibe (ZETIA) 10 MG tablet Take 10 mg by mouth daily.      Historical Provider, MD  ferrous sulfate 325 (65 FE) MG tablet Take 325 mg by mouth 2 (two) times daily.      Historical Provider, MD  hydrochlorothiazide (HYDRODIURIL) 25 MG tablet Take 25 mg by mouth daily.      Historical Provider, MD  HYDROcodone-acetaminophen (NORCO/VICODIN) 5-325 MG per tablet Take 1-2 tablets by mouth every 4 (four) hours as needed. 04/04/15   Robyn M Hess, PA-C  insulin glargine (LANTUS) 100 UNIT/ML injection Inject 22 Units into the skin at  bedtime.     Historical Provider, MD  multivitamin [facility]) per tablet Take 1 tablet by mouth daily.      Historical Provider, MD  naproxen (NAPROSYN) 500 MG tablet Take 1 tablet (500 mg total) by mouth 2 (two) times daily. 04/04/15   Robyn M Hess, PA-C  OMEGA 3 1200 MG CAPS Take 1 capsule by mouth daily.      Historical Provider, MD  Pitavastatin Calcium (LIVALO) 2 MG TABS Take 2 mg by mouth daily.      Historical Provider, MD  ramipril (ALTACE) 10 MG capsule Take 10 mg by mouth daily.      Historical Provider, MD  simvastatin (ZOCOR) 20 MG tablet Take 20 mg by mouth daily.    Historical Provider, MD  SITagliptin-MetFORMIN HCl (JANUMET PO) Take 500 mg by mouth 2 (two) times daily.    Historical Provider, MD   BP 140/64 mmHg  Pulse 64  Temp(Src) 98.4 F (36.9 C) (Oral)  Resp 15  Wt 269 lb (122.018 kg)  SpO2 97% Physical Exam  Constitutional: She is oriented to person, place, and time. She appears well-developed and well-nourished. No distress.  Awake, alert  HENT:  Head: Normocephalic and atraumatic.  Eyes: Conjunctivae and EOM are normal. Pupils are equal, round, and reactive to light.  Neck: Neck supple.  Cardiovascular: Normal rate, regular rhythm and normal heart sounds.   No murmur heard. Pulmonary/Chest: Effort normal and breath sounds normal. No respiratory distress.  Abdominal: Soft. Bowel sounds are normal. She exhibits no distension.  Musculoskeletal: She exhibits no edema.  Neurological: She is alert and oriented to person, place, and time. She has normal reflexes. No cranial nerve deficit. She exhibits normal muscle tone.  Fluent speech, no clonus 5/5 strength LUE, BLE 3/5 strength RUE  normal sensation throughout Dysmetria R hand, normal finger to nose testing L hand Mild R pronator drift   Skin: Skin is warm and dry. No rash noted.  Psychiatric: She has a normal mood and affect. Judgment and thought content normal.  Nursing note and vitals reviewed.   ED Course   .Critical Care Performed by: Laurence Spates Authorized by: Laurence Spates Total critical care time: 30 minutes Critical care time was exclusive of separately billable procedures and treating other patients. Critical care was necessary to treat or prevent imminent or life-threatening deterioration of the following conditions: CNS failure or compromise. Critical care was time spent personally by me on the following activities: development of treatment plan with patient or surrogate, discussions with consultants, evaluation of patient's response to treatment, examination of patient, obtaining history from patient or surrogate, ordering and performing treatments and interventions, ordering and review of laboratory studies, ordering  and review of radiographic studies and review of old charts.   (including critical care time) DIAGNOSTIC STUDIES: Oxygen Saturation is 100% on RA, normal by my interpretation.   COORDINATION OF CARE: 5:56 PM-Discussed next steps with pt including CT Head w/o contrast. Pt verbalized understanding and is agreeable with the plan.   Labs Review Labs Reviewed  CBC - Abnormal; Notable for the following:    Hemoglobin 11.1 (*)    HCT 33.9 (*)    All other components within normal limits  COMPREHENSIVE METABOLIC PANEL - Abnormal; Notable for the following:    Glucose, Bld 223 (*)    Creatinine, Ser 1.04 (*)    GFR calc non Af Amer 56 (*)    All other components within normal limits  CBG MONITORING, ED - Abnormal; Notable for the following:    Glucose-Capillary 199 (*)    All other components within normal limits  PROTIME-INR  APTT  DIFFERENTIAL  TROPONIN I  ETHANOL  URINE RAPID DRUG SCREEN, HOSP PERFORMED  URINALYSIS, ROUTINE W REFLEX MICROSCOPIC (NOT AT [facility])    Imaging Review Ct Head Wo Contrast  01/02/2016  CLINICAL DATA:  Acute onset of worsening unilateral right-sided weakness. Tingling and heaviness in the right arm. Initial encounter.  EXAM: CT HEAD WITHOUT CONTRAST TECHNIQUE: Contiguous axial images were obtained from the base of the skull through the vertex without intravenous contrast. COMPARISON:  CT of the head performed 01/22/2010, and MRI of the brain performed 01/23/2010 FINDINGS: There is no evidence of acute infarction, mass lesion, or intra- or extra-axial hemorrhage on CT. Prominence of sulci suggests mild cortical volume loss. There is chronic infarct at the left parietal and temporal lobes, with associated encephalomalacia. Mild periventricular white matter change likely reflects small vessel ischemic microangiopathy. The brainstem and fourth ventricle are within normal limits. The basal ganglia are unremarkable in appearance. The cerebral hemispheres demonstrate grossly normal gray-white differentiation. No mass effect or midline shift is seen. There is no evidence of fracture; visualized osseous structures are unremarkable in appearance. There is incomplete fusion of the posterior arch of C1. The orbits are within normal limits. The paranasal sinuses and mastoid air cells are well-aerated. No significant soft tissue abnormalities are seen. IMPRESSION: 1. No acute intracranial pathology seen on CT. 2. Mild cortical volume loss and scattered small vessel ischemic microangiopathy. 3. Chronic infarct at the left parietal and temporal lobes, with associated encephalomalacia. Electronically Signed   By: Roanna Raider M.D.   On: 01/02/2016 18:16   I have personally reviewed and evaluated these lab results as part of my medical decision-making.   EKG Interpretation   Date/Time:  Saturday January 02 2016 18:07:34 EDT Ventricular Rate:  59 PR Interval:  207 QRS Duration: 127 QT Interval:  435 QTC Calculation: 431 R Axis:   -56 Text Interpretation:  Sinus rhythm Nonspecific IVCD with LAD Left  ventricular hypertrophy since previous EKG, bradycardia has improved  Confirmed by Helayne Metsker MD, Rileigh Kawashima ([id number]) on 01/02/2016 6:13:39 PM       MDM   Final diagnoses:  Right arm weakness   Pt w/ h/o 2 previous strokes who p/w sudden onset of R arm weakness  that she noticed at [employer] when she was trying to use a fork. On arrival, she was awake and alert, normal vital signs. She had a right arm weakness, mild dysmetria on right, and right pronator drift. The remainder of her exam showed no other neurologic abnormalities. Immediately ordered code stroke given her concerning symptoms and discussed  patient with neurology, Dr. Lavon PaganiniNandigam. After discussing options of tPA vs no tPA, I felt that pt would benefit from neurologist examination [facility] prior to initiation of tPA as her NIH stroke scale is low and I feel that risks of TPA might outweigh benefits. The patient is a potential candidate as she has no contraindications. I have discussed [facility] without initiation of tPA to await neurology eval and pt/family in agreement with plan. Her lab work here and EKG are unremarkable. Head CT negative for hemorrhage. I discussed transfer with the ED physician Dr. Clydene PughKnott. [facility].  I personally performed the services described in this documentation, which was scribed in my presence. The recorded information has been reviewed and is accurate.    Laurence Spatesachel Morgan Torrence Branagan, MD 01/02/16 [phone]

## 2016-01-02 NOTE — ED Notes (Signed)
[facility]. R grip and drift noted to arm. Reports hx of two previous strokes with intermittent aphasia. Pt denies pain, c/o tingling to R arm.

## 2016-01-02 NOTE — Consult Note (Signed)
Admission H&P    Chief Complaint: Acute onset of right upper extremity weakness and numbness.  HPI: Catherine Bautista is an 65 y.o. female with a history of diabetes mellitus, hypertension, hyperlipidemia and 2 previous strokes, presenting with new onset right upper extremity weakness. Patient had a sudden onset of inability to hold objects with her right hand, and the right arm felt heavy. There was also numbness of her right upper extremity. She had no change in speech. She didn't notice any facial droop. She had no symptoms involving right lower extremity. She's been taking Plavix 75 mg per day for antiplatelet therapy. CT scan of the head showed no acute changes. Chronic infarct involving left parietal and temporal lobes with encephalomalacia was noted. NIH stroke score was 3.  LSN: 5:00 PM on 01/02/2016 tPA Given: No: Minimal deficits mRankin:  Past Medical History  Diagnosis Date  . Anemia   . Anemia, vitamin B12 deficiency   . Obesity   . Fibromyalgia   . Dyslipidemia   . Hypertension   . Diabetes mellitus   . Stroke ([place])   . Depression with anxiety     Past Surgical History  Procedure Laterality Date  . Gastric bypass  1981  . Cholecystectomy      laproscopic  . Abdominal hysterectomy  2007  . Therapuetic abortion  1986    associated with C-section    History reviewed. No pertinent family history. Social History:  reports that she has never smoked. She has never used smokeless tobacco. She reports that she does not drink alcohol or use illicit drugs.  Allergies:  Allergies  Allergen Reactions  . Demerol Nausea And Vomiting  . Novocain [Procaine Hcl] Nausea And Vomiting    Medications: Preadmission medications were reviewed by me.  ROS: History obtained from the patient  General ROS: negative for - chills, fatigue, fever, night sweats, weight gain or weight loss Psychological ROS: negative for - behavioral disorder, hallucinations, memory difficulties, mood swings or  suicidal ideation Ophthalmic ROS: negative for - blurry vision, double vision, eye pain or loss of vision ENT ROS: negative for - epistaxis, nasal discharge, oral lesions, sore throat, tinnitus or vertigo Allergy and Immunology ROS: negative for - hives or itchy/watery eyes Hematological and Lymphatic ROS: negative for - bleeding problems, bruising or swollen lymph nodes Endocrine ROS: negative for - galactorrhea, hair pattern changes, polydipsia/polyuria or temperature intolerance Respiratory ROS: negative for - cough, hemoptysis, shortness of breath or wheezing Cardiovascular ROS: negative for - chest pain, dyspnea on exertion, edema or irregular heartbeat Gastrointestinal ROS: negative for - abdominal pain, diarrhea, hematemesis, nausea/vomiting or stool incontinence Genito-Urinary ROS: negative for - dysuria, hematuria, incontinence or urinary frequency/urgency Musculoskeletal ROS: negative for - joint swelling or muscular weakness Neurological ROS: as noted in HPI Dermatological ROS: negative for rash and skin lesion changes  Physical Examination: Blood pressure 140/64, pulse 64, temperature 98.4 F (36.9 C), temperature source Oral, resp. rate 15, weight 122.018 kg (269 lb), SpO2 97 %.  HEENT-  Normocephalic, no lesions, without obvious abnormality.  Normal external eye and conjunctiva.  Normal TM's bilaterally.  Normal auditory canals and external ears. Normal external nose, mucus membranes and septum.  Normal pharynx. Neck supple with no masses, nodes, nodules or enlargement. Cardiovascular - regular rate and rhythm, S1, S2 normal, no murmur, click, rub or gallop Lungs - chest clear, no wheezing, rales, normal symmetric air entry Abdomen - soft, non-tender; bowel sounds normal; no masses,  no organomegaly Extremities - no joint  deformities, effusion, or inflammation, large varicose vein of medial right leg noted  Neurologic Examination: Mental Status: Alert, oriented, thought  content appropriate.  Speech fluent without evidence of aphasia. Able to follow commands without difficulty. Cranial Nerves: II-Visual fields were normal. III/IV/VI-Pupils were equal and reacted normally to light. Extraocular movements were full and conjugate.    V/VII-no facial numbness; mild right lower facial droop. VIII-normal. X-normal speech and symmetrical palatal movement. XI: trapezius strength/neck flexion strength normal bilaterally XII-midline tongue extension with normal strength. Motor: Mild drift of right upper extremity with slightly reduced grip strength of right hand compared to left; motor exam unremarkable otherwise. Sensory: Reduced perception of tactile sensation over right extremities compared to left extremities. Deep Tendon Reflexes: 1+ and symmetric. Plantars: Mute bilaterally Cerebellar: Normal finger-to-nose testing. Carotid auscultation: Normal  Results for orders placed or performed during the hospital encounter of 01/02/16 (from the past 48 hour(s))  Protime-INR     Status: None   Collection Time: 01/02/16  5:59 PM  Result Value Ref Range   Prothrombin Time 13.4 11.6 - 15.2 seconds   INR 1.00 0.00 - 1.49  APTT     Status: None   Collection Time: 01/02/16  5:59 PM  Result Value Ref Range   aPTT 29 24 - 37 seconds  CBC     Status: Abnormal   Collection Time: 01/02/16  5:59 PM  Result Value Ref Range   WBC 5.9 4.0 - 10.5 K/uL   RBC 4.07 3.87 - 5.11 MIL/uL   Hemoglobin 11.1 (L) 12.0 - 15.0 g/dL   HCT 33.9 (L) 36.0 - 46.0 %   MCV 83.3 78.0 - 100.0 fL   MCH 27.3 26.0 - 34.0 pg   MCHC 32.7 30.0 - 36.0 g/dL   RDW 15.1 11.5 - 15.5 %   Platelets 293 150 - 400 K/uL  Differential     Status: None   Collection Time: 01/02/16  5:59 PM  Result Value Ref Range   Neutrophils Relative % 52 %   Neutro Abs 3.0 1.7 - 7.7 K/uL   Lymphocytes Relative 36 %   Lymphs Abs 2.1 0.7 - 4.0 K/uL   Monocytes Relative 11 %   Monocytes Absolute 0.7 0.1 - 1.0 K/uL    Eosinophils Relative 1 %   Eosinophils Absolute 0.1 0.0 - 0.7 K/uL   Basophils Relative 0 %   Basophils Absolute 0.0 0.0 - 0.1 K/uL  Comprehensive metabolic panel     Status: Abnormal   Collection Time: 01/02/16  5:59 PM  Result Value Ref Range   Sodium 137 135 - 145 mmol/L   Potassium 4.4 3.5 - 5.1 mmol/L   Chloride 105 101 - 111 mmol/L   CO2 27 22 - 32 mmol/L   Glucose, Bld 223 (H) 65 - 99 mg/dL   BUN 17 6 - 20 mg/dL   Creatinine, Ser 1.04 (H) 0.44 - 1.00 mg/dL   Calcium 8.9 8.9 - 10.3 mg/dL   Total Protein 7.0 6.5 - 8.1 g/dL   Albumin 3.5 3.5 - 5.0 g/dL   AST 24 15 - 41 U/L   ALT 20 14 - 54 U/L   Alkaline Phosphatase 95 38 - 126 U/L   Total Bilirubin 0.4 0.3 - 1.2 mg/dL   GFR calc non Af Amer 56 (L) >60 mL/min   GFR calc Af Amer >60 >60 mL/min    Comment: (NOTE) The eGFR has been calculated using the CKD EPI equation. This calculation has not been validated in all  clinical situations. eGFR's persistently <60 mL/min signify possible Chronic Kidney Disease.    Anion gap 5 5 - 15  Troponin I     Status: None   Collection Time: 01/02/16  5:59 PM  Result Value Ref Range   Troponin I <0.03 <0.031 ng/mL    Comment:        NO INDICATION OF MYOCARDIAL INJURY.   CBG monitoring, ED     Status: Abnormal   Collection Time: 01/02/16  5:59 PM  Result Value Ref Range   Glucose-Capillary 199 (H) 65 - 99 mg/dL  Ethanol     Status: None   Collection Time: 01/02/16  5:59 PM  Result Value Ref Range   Alcohol, Ethyl (B) <5 <5 mg/dL    Comment:        LOWEST DETECTABLE LIMIT FOR SERUM ALCOHOL IS 5 mg/dL FOR MEDICAL PURPOSES ONLY    Ct Head Wo Contrast  01/02/2016  CLINICAL DATA:  Acute onset of worsening unilateral right-sided weakness. Tingling and heaviness in the right arm. Initial encounter. EXAM: CT HEAD WITHOUT CONTRAST TECHNIQUE: Contiguous axial images were obtained from the base of the skull through the vertex without intravenous contrast. COMPARISON:  CT of the head  performed 01/22/2010, and MRI of the brain performed 01/23/2010 FINDINGS: There is no evidence of acute infarction, mass lesion, or intra- or extra-axial hemorrhage on CT. Prominence of sulci suggests mild cortical volume loss. There is chronic infarct at the left parietal and temporal lobes, with associated encephalomalacia. Mild periventricular white matter change likely reflects small vessel ischemic microangiopathy. The brainstem and fourth ventricle are within normal limits. The basal ganglia are unremarkable in appearance. The cerebral hemispheres demonstrate grossly normal gray-white differentiation. No mass effect or midline shift is seen. There is no evidence of fracture; visualized osseous structures are unremarkable in appearance. There is incomplete fusion of the posterior arch of C1. The orbits are within normal limits. The paranasal sinuses and mastoid air cells are well-aerated. No significant soft tissue abnormalities are seen. IMPRESSION: 1. No acute intracranial pathology seen on CT. 2. Mild cortical volume loss and scattered small vessel ischemic microangiopathy. 3. Chronic infarct at the left parietal and temporal lobes, with associated encephalomalacia. Electronically Signed   By: Garald Balding M.D.   On: 01/02/2016 18:16    Assessment: 65 y.o. female with multiple risk factors for stroke as well as previous cerebral infarctions, presenting with probable recurrent right MCA territory small vessel infarction.  Stroke Risk Factors - diabetes mellitus, family history, hyperlipidemia and hypertension  Plan: 1. HgbA1c, fasting lipid panel 2. MRI, MRA  of the brain without contrast 3. PT consult, OT consult, Speech consult 4. Echocardiogram 5. Carotid dopplers 6. Prophylactic therapy-Antiplatelet med: Plavix  7. Risk factor modification 8. Telemetry monitoring  C.R. Nicole Kindred, MD [facility] [phone]  01/02/2016, 7:41 PM

## 2016-01-02 NOTE — ED Provider Notes (Signed)
Patient was seen and evaluated on arrival to the emergency department.  Appears stable from outside facility, neurology paged to see and consider tPA.  Lyndal Pulleyaniel Kelle Ruppert, MD 01/02/16 Ebony [id number]

## 2016-01-02 NOTE — H&P (Signed)
History and Physical    Catherine Bautista NWG:[id number] DOB: Nov 05, 1950 DOA: 01/02/2016  Referring MD/NP/PA:   PCP: No PCP Per Patient   Patient coming from:  The patient is coming from home.  At baseline, pt is independent for most of ADL.  Chief Complaint: right arm weakness and numbness  HPI: Catherine Bautista is a 65 y.o. female with medical history significant of stroke, anemia, obesity, fibromyalgia, hypertension, hyperlipidemia, diabetes mellitus, depression, anxiety, who presents with right arm weakness and numbness.  Patient reports that she started having right arm numbness and weakness at about 5 PM. It has improved since then, but still has some mild weakness and numbness. She also has tingling sensations in her right toes. No leg weakness. Patient does not have slurry speech, vision change, hearing loss. She feels nauseated, but no vomiting, abdominal pain or diarrhea. Patient does not have fever, chills, chest pain, shortness of breath, cough, symptoms of UTI.  ED Course: pt was found to have INR 1.0, PTT 29, troponin negative, temperature normal, no tachycardia, no tachypnea, electrolytes renal function okay. CT head is negative for acute intracranial abnormalities. Patient is placed on telemetry bed for observation. Neurology was consulted by EDP.  Review of Systems:   General: no fevers, chills, no changes in body weight, appetite, has fatigue HEENT: no blurry vision, hearing changes or sore throat Pulm: no dyspnea, coughing, wheezing CV: no chest pain, no palpitations Abd: has nausea, no vomiting, abdominal pain, diarrhea, constipation GU: no dysuria, burning on urination, increased urinary frequency, hematuria  Ext: no leg edema. Has varicose vein in right leg. Neuro: has right arm unilateral weakness, numbness, or tingling, No vision change or hearing loss Skin: no rash MSK: No muscle spasm, no deformity, no limitation of range of movement in spin Heme: No easy bruising.  Travel  history: No recent long distant travel.  Allergy:  Allergies  Allergen Reactions  . Demerol Nausea And Vomiting  . Excedrin Extra Strength [Aspirin-Acetaminophen-Caffeine] Other (See Comments)    Weird feeling  . Invokana [Canagliflozin] Other (See Comments)    Decreasing renal function and extremely lethargic  . Janumet [Sitagliptin-Metformin Hcl] Other (See Comments)    Bloating and gas  . Morphine And Related Other (See Comments)    Weird feeling  . Novocain [Procaine Hcl] Nausea And Vomiting    Past Medical History  Diagnosis Date  . Anemia   . Anemia, vitamin B12 deficiency   . Obesity   . Fibromyalgia   . Dyslipidemia   . Hypertension   . Diabetes mellitus   . Stroke (HCC)   . Depression with anxiety     Past Surgical History  Procedure Laterality Date  . Gastric bypass  1981  . Cholecystectomy      laproscopic  . Abdominal hysterectomy  2007  . Therapuetic abortion  1986    associated with C-section    Social History:  reports that she has never smoked. She has never used smokeless tobacco. She reports that she does not drink alcohol or use illicit drugs.  Family History:  Family History  Problem Relation Age of Onset  . Stroke Mother   . Heart failure Mother   . Lung cancer Father   . Kidney cancer Brother      Prior to Admission medications   Medication Sig Start Date End Date Taking? Authorizing Provider  atorvastatin (LIPITOR) 20 MG tablet Take 20 mg by mouth daily.   Yes Historical Provider, MD  Insulin Detemir (LEVEMIR  FLEXPEN [place]) Inject 28 Units into the skin.   Yes Historical Provider, MD  sitaGLIPtin (JANUVIA) 50 MG tablet Take 50 mg by mouth daily.   Yes Historical Provider, MD  amitriptyline (ELAVIL) 10 MG tablet Take 20 mg by mouth at bedtime.      Historical Provider, MD  b complex vitamins tablet Take 1 tablet by mouth daily.      Historical Provider, MD  Bromocriptine Mesylate (CYCLOSET PO) Take 0.48 mg by mouth daily.      Historical  Provider, MD  clopidogrel (PLAVIX) 75 MG tablet Take 75 mg by mouth daily.   01/24/10   Historical Provider, MD  cyclobenzaprine (FLEXERIL) 10 MG tablet Take 1 tablet (10 mg total) by mouth 3 (three) times daily. 11/13/13   John Molpus, MD  diazepam (VALIUM) 5 MG tablet Take 1 tablet (5 mg total) by mouth every 12 (twelve) hours as needed for muscle spasms. 04/04/15   Kathrynn Speed, PA-C  ergocalciferol (VITAMIN D2) 50000 UNITS capsule Take 50,000 Units by mouth once a week.      Historical Provider, MD  ezetimibe (ZETIA) 10 MG tablet Take 10 mg by mouth daily.      Historical Provider, MD  ferrous sulfate 325 (65 FE) MG tablet Take 325 mg by mouth 2 (two) times daily.      Historical Provider, MD  hydrochlorothiazide (HYDRODIURIL) 25 MG tablet Take 25 mg by mouth daily.      Historical Provider, MD  HYDROcodone-acetaminophen (NORCO/VICODIN) 5-325 MG per tablet Take 1-2 tablets by mouth every 4 (four) hours as needed. 04/04/15   Robyn M Hess, PA-C  insulin glargine (LANTUS) 100 UNIT/ML injection Inject 22 Units into the skin at bedtime.     Historical Provider, MD  multivitamin [facility]) per tablet Take 1 tablet by mouth daily.      Historical Provider, MD  naproxen (NAPROSYN) 500 MG tablet Take 1 tablet (500 mg total) by mouth 2 (two) times daily. 04/04/15   Robyn M Hess, PA-C  OMEGA 3 1200 MG CAPS Take 1 capsule by mouth daily.      Historical Provider, MD  Pitavastatin Calcium (LIVALO) 2 MG TABS Take 2 mg by mouth daily.      Historical Provider, MD  ramipril (ALTACE) 10 MG capsule Take 10 mg by mouth daily.      Historical Provider, MD  simvastatin (ZOCOR) 20 MG tablet Take 20 mg by mouth daily.    Historical Provider, MD  SITagliptin-MetFORMIN HCl (JANUMET PO) Take 500 mg by mouth 2 (two) times daily.    Historical Provider, MD    Physical Exam: Filed Vitals:   01/02/16 1815 01/02/16 1908 01/02/16 1930 01/02/16 2044  BP:  140/64 137/65 131/72  Pulse:  64 60 54  Temp: 98 F (36.7 C) 98.4 F  (36.9 C)    TempSrc:  Oral    Resp:  15 12   Weight:      SpO2:  97% 95% 99%   General: Not in acute distress HEENT:       Eyes: PERRL, EOMI, no scleral icterus.       ENT: No discharge from the ears and nose, no pharynx injection, no tonsillar enlargement.        Neck: No JVD, no bruit, no mass felt. Heme: No neck lymph node enlargement. Cardiac: S1/S2, RRR, No murmurs, No gallops or rubs. Pulm: Good air movement bilaterally. No rales, wheezing, rhonchi or rubs. Abd: Soft, nondistended, nontender, no rebound pain, no organomegaly,  BS present. GU: No hematuria Ext: No pitting leg edema bilaterally. 2+DP/PT pulse bilaterally. Musculoskeletal: No joint deformities, No joint redness or warmth, no limitation of ROM in spin. Skin: No rashes.  Neuro: Alert, oriented X3, cranial nerves II-XII grossly intact, moves all extremities normally. Muscle strength 2/5 in right arm, 5/5 in other extremities, sensation to light touch intact. Brachial reflex 2+ bilaterally. Knee reflex 1+ bilaterally. Negative Babinski's sign. Normal finger to nose test. Psych: Patient is not psychotic, no suicidal or hemocidal ideation.  Labs on Admission: I have personally reviewed following labs and imaging studies  CBC:  Recent Labs Lab 01/02/16 1759  WBC 5.9  NEUTROABS 3.0  HGB 11.1*  HCT 33.9*  MCV 83.3  PLT 293   Basic Metabolic Panel:  Recent Labs Lab 01/02/16 1759  NA 137  K 4.4  CL 105  CO2 27  GLUCOSE 223*  BUN 17  CREATININE 1.04*  CALCIUM 8.9   GFR: CrCl cannot be calculated (Unknown ideal weight.). Liver Function Tests:  Recent Labs Lab 01/02/16 1759  AST 24  ALT 20  ALKPHOS 95  BILITOT 0.4  PROT 7.0  ALBUMIN 3.5   No results for input(s): LIPASE, AMYLASE in the last 168 hours. No results for input(s): AMMONIA in the last 168 hours. Coagulation Profile:  Recent Labs Lab 01/02/16 1759  INR 1.00   Cardiac Enzymes:  Recent Labs Lab 01/02/16 1759  TROPONINI <0.03     BNP (last 3 results) No results for input(s): PROBNP in the last 8760 hours. HbA1C: No results for input(s): HGBA1C in the last 72 hours. CBG:  Recent Labs Lab 01/02/16 1759  GLUCAP 199*   Lipid Profile: No results for input(s): CHOL, HDL, LDLCALC, TRIG, CHOLHDL, LDLDIRECT in the last 72 hours. Thyroid Function Tests: No results for input(s): TSH, T4TOTAL, FREET4, T3FREE, THYROIDAB in the last 72 hours. Anemia Panel: No results for input(s): VITAMINB12, FOLATE, FERRITIN, TIBC, IRON, RETICCTPCT in the last 72 hours. Urine analysis:    Component Value Date/Time   COLORURINE YELLOW 01/02/2016 1946   APPEARANCEUR CLEAR 01/02/2016 1946   LABSPEC 1.016 01/02/2016 1946   PHURINE 5.0 01/02/2016 1946   GLUCOSEU 100* 01/02/2016 1946   HGBUR NEGATIVE 01/02/2016 1946   BILIRUBINUR NEGATIVE 01/02/2016 1946   KETONESUR NEGATIVE 01/02/2016 1946   PROTEINUR NEGATIVE 01/02/2016 1946   NITRITE NEGATIVE 01/02/2016 1946   LEUKOCYTESUR NEGATIVE 01/02/2016 1946   Sepsis Labs: (procalcitonin:4,lacticidven:4) )No results found for this or any previous visit (from the past 240 hour(s)).   Radiological Exams on Admission: Ct Head Wo Contrast  01/02/2016  CLINICAL DATA:  Acute onset of worsening unilateral right-sided weakness. Tingling and heaviness in the right arm. Initial encounter. EXAM: CT HEAD WITHOUT CONTRAST TECHNIQUE: Contiguous axial images were obtained from the base of the skull through the vertex without intravenous contrast. COMPARISON:  CT of the head performed 01/22/2010, and MRI of the brain performed 01/23/2010 FINDINGS: There is no evidence of acute infarction, mass lesion, or intra- or extra-axial hemorrhage on CT. Prominence of sulci suggests mild cortical volume loss. There is chronic infarct at the left parietal and temporal lobes, with associated encephalomalacia. Mild periventricular white matter change likely reflects small vessel ischemic microangiopathy. The  brainstem and fourth ventricle are within normal limits. The basal ganglia are unremarkable in appearance. The cerebral hemispheres demonstrate grossly normal gray-white differentiation. No mass effect or midline shift is seen. There is no evidence of fracture; visualized osseous structures are unremarkable in appearance. There is incomplete fusion  of the posterior arch of C1. The orbits are within normal limits. The paranasal sinuses and mastoid air cells are well-aerated. No significant soft tissue abnormalities are seen. IMPRESSION: 1. No acute intracranial pathology seen on CT. 2. Mild cortical volume loss and scattered small vessel ischemic microangiopathy. 3. Chronic infarct at the left parietal and temporal lobes, with associated encephalomalacia. Electronically Signed   By: Roanna Raider M.D.   On: 01/02/2016 18:16     EKG: Independently reviewed. Sinus rhythm, QTC 431, LAD, late R-wave progression, anteroseptal infarction pattern.  Assessment/Plan Principal Problem:   Stroke [facility]) Active Problems:   Anemia, unspecified   Dyslipidemia   Hypertension   Anemia   Depression with anxiety   Right arm weakness   Stroke [facility]): pt has history of stroke twice, last one was 2011. Now presents with right arm weakness and numbness. She also has tingling sensations in right toes, concerning for new stroke. CT head is negative for acute intracranial abnormalities. Neurology was consulted, Dr. Roseanne Reno will see pt.  - will place on tele bed for obs - Neurology was consulted by EDP, will follow up recommendations. -Atrial fibrillation: not present  -tPA was not given because Low NIH score (3) - Risk factor modification: HgbA1c, fasting lipid panel - MRI, MRA of the brain without contrast  - PT consult, OT consult - Bedside swallowing screen was ordered, will get speech consult in AM - 2 d Echocardiogram  - Ekg done - Carotid dopplers  - Plavix - check UDS  HLD: Last LDL was 181 on  01/23/10 -continue Lipitor and Zetia -Check FLP  HTN: -hold HCTZ when pt is NPO -continue ramipril -IV hydralazine when necessary  Depression and anxiety: Stable, no suicidal or homicidal ideations. -Continue home medications: Amitriptyline and Wellbutrin, Valium  Anemia: Hemoglobin stable, 11.1 on admission -Follow-up with CBC  DM-II: Last A1c 8.7 on 01/23/10 poorly controled. Patient is taking Levemir, Janumet at home -will decrease levemir dose from 28 to 18 units daily -SSI -Check A1c  DVT ppx: sQ Lovenox Code Status: Full code Family Communication: Yes, patient's husband and son at bed side Disposition Plan:  Anticipate discharge back to previous home environment Consults called:  Neurology, Dr. Roseanne Reno Admission status: Obs / tele   Date of Service 01/02/2016    Lorretta Harp [facility] Pager [phone]  If 7PM-7AM, please contact night-coverage [email or web address] Password [facility] 01/02/2016, 9:24 PM

## 2016-01-02 NOTE — ED Notes (Signed)
Pt returned from MRI; EMT transporting pt to [facility]

## 2016-01-03 ENCOUNTER — Observation Stay (HOSPITAL_BASED_OUTPATIENT_CLINIC_OR_DEPARTMENT_OTHER): Payer: BC Managed Care – PPO

## 2016-01-03 ENCOUNTER — Encounter (HOSPITAL_COMMUNITY): Payer: BC Managed Care – PPO

## 2016-01-03 DIAGNOSIS — Z794 Long term (current) use of insulin: Secondary | ICD-10-CM | POA: Diagnosis not present

## 2016-01-03 DIAGNOSIS — I639 Cerebral infarction, unspecified: Secondary | ICD-10-CM | POA: Diagnosis present

## 2016-01-03 DIAGNOSIS — E669 Obesity, unspecified: Secondary | ICD-10-CM | POA: Diagnosis present

## 2016-01-03 DIAGNOSIS — M797 Fibromyalgia: Secondary | ICD-10-CM | POA: Diagnosis present

## 2016-01-03 DIAGNOSIS — E1165 Type 2 diabetes mellitus with hyperglycemia: Secondary | ICD-10-CM

## 2016-01-03 DIAGNOSIS — D649 Anemia, unspecified: Secondary | ICD-10-CM | POA: Diagnosis present

## 2016-01-03 DIAGNOSIS — Z6841 Body Mass Index (BMI) 40.0 and over, adult: Secondary | ICD-10-CM | POA: Diagnosis not present

## 2016-01-03 DIAGNOSIS — F418 Other specified anxiety disorders: Secondary | ICD-10-CM | POA: Diagnosis present

## 2016-01-03 DIAGNOSIS — I359 Nonrheumatic aortic valve disorder, unspecified: Secondary | ICD-10-CM | POA: Diagnosis not present

## 2016-01-03 DIAGNOSIS — R29898 Other symptoms and signs involving the musculoskeletal system: Secondary | ICD-10-CM

## 2016-01-03 DIAGNOSIS — Z8673 Personal history of transient ischemic attack (TIA), and cerebral infarction without residual deficits: Secondary | ICD-10-CM | POA: Diagnosis not present

## 2016-01-03 DIAGNOSIS — I63031 Cerebral infarction due to thrombosis of right carotid artery: Secondary | ICD-10-CM

## 2016-01-03 DIAGNOSIS — R200 Anesthesia of skin: Secondary | ICD-10-CM | POA: Diagnosis present

## 2016-01-03 DIAGNOSIS — E785 Hyperlipidemia, unspecified: Secondary | ICD-10-CM | POA: Diagnosis present

## 2016-01-03 DIAGNOSIS — Z791 Long term (current) use of non-steroidal anti-inflammatories (NSAID): Secondary | ICD-10-CM | POA: Diagnosis not present

## 2016-01-03 DIAGNOSIS — Z79899 Other long term (current) drug therapy: Secondary | ICD-10-CM | POA: Diagnosis not present

## 2016-01-03 DIAGNOSIS — I10 Essential (primary) hypertension: Secondary | ICD-10-CM | POA: Diagnosis present

## 2016-01-03 DIAGNOSIS — I63311 Cerebral infarction due to thrombosis of right middle cerebral artery: Secondary | ICD-10-CM | POA: Diagnosis not present

## 2016-01-03 DIAGNOSIS — Z7902 Long term (current) use of antithrombotics/antiplatelets: Secondary | ICD-10-CM | POA: Diagnosis not present

## 2016-01-03 DIAGNOSIS — I63512 Cerebral infarction due to unspecified occlusion or stenosis of left middle cerebral artery: Secondary | ICD-10-CM | POA: Diagnosis not present

## 2016-01-03 DIAGNOSIS — R29703 NIHSS score 3: Secondary | ICD-10-CM | POA: Diagnosis present

## 2016-01-03 DIAGNOSIS — Z9884 Bariatric surgery status: Secondary | ICD-10-CM | POA: Diagnosis not present

## 2016-01-03 LAB — ECHOCARDIOGRAM COMPLETE
Height: 66 in
Weight: 4243.2 oz

## 2016-01-03 LAB — GLUCOSE, CAPILLARY
Glucose-Capillary: 164 mg/dL — ABNORMAL HIGH (ref 65–99)
Glucose-Capillary: 190 mg/dL — ABNORMAL HIGH (ref 65–99)
Glucose-Capillary: 185 mg/dL — ABNORMAL HIGH (ref 65–99)
Glucose-Capillary: 220 mg/dL — ABNORMAL HIGH (ref 65–99)

## 2016-01-03 LAB — LIPID PANEL
Cholesterol: 153 mg/dL (ref 0–200)
HDL: 43 mg/dL (ref >40)
LDL Cholesterol: 96 mg/dL (ref 0–99)
Total CHOL/HDL Ratio: 3.6 RATIO
Triglycerides: 69 mg/dL (ref <150)
VLDL: 14 mg/dL (ref 0–40)

## 2016-01-03 MED ORDER — HYDROCHLOROTHIAZIDE 25 MG PO TABS
25.0000 mg | ORAL_TABLET | Freq: Every day | ORAL | Status: DC
  Administered 2016-01-03 – 2016-01-06 (×4): 25 mg via ORAL
  Filled 2016-01-03 (×4): qty 1

## 2016-01-03 MED ORDER — INSULIN DETEMIR 100 UNIT/ML ~~LOC~~ SOLN
22.0000 [IU] | Freq: Every day | SUBCUTANEOUS | Status: DC
  Administered 2016-01-03 – 2016-01-05 (×3): 22 [IU] via SUBCUTANEOUS
  Filled 2016-01-03 (×4): qty 0.22

## 2016-01-03 MED ORDER — ATORVASTATIN CALCIUM 40 MG PO TABS
40.0000 mg | ORAL_TABLET | Freq: Every day | ORAL | Status: DC
  Administered 2016-01-03 – 2016-01-05 (×3): 40 mg via ORAL
  Filled 2016-01-03 (×3): qty 1

## 2016-01-03 MED ORDER — ASPIRIN EC 325 MG PO TBEC
325.0000 mg | DELAYED_RELEASE_TABLET | Freq: Every day | ORAL | Status: DC
  Administered 2016-01-03 – 2016-01-06 (×4): 325 mg via ORAL
  Filled 2016-01-03 (×4): qty 1

## 2016-01-03 NOTE — Progress Notes (Signed)
[facility] PROGRESS NOTE  Margot ChimesLou S Bayliss ZOX:[id number] DOB: 29-Jun-1951 DOA: 01/02/2016 PCP: No PCP Per Patient  Interim summary and HPI 65 y.o. female with medical history significant of stroke, anemia, obesity, fibromyalgia, hypertension, hyperlipidemia, diabetes mellitus, depression, anxiety, who presents with right arm weakness and numbness.  Patient reports that she started having right arm numbness and weakness at about 5 PM. It has improved since then, but still has some mild weakness and numbness. She also has tingling sensations in her right toes. No leg weakness. Patient does not have slurry speech, vision change, hearing loss. She feels nauseated, but no vomiting, abdominal pain or diarrhea. Patient does not have fever, chills, chest pain, shortness of breath, cough, symptoms of UTI.  Feeling better and complaining just of mild numbness/weakness on right side. Plan is to discharge Home with outpatient PT at discharge; will complete work up and follow rec's from neurology.   Assessment/Plan: Stroke [facility](HCC): pt has history of stroke twice, last one was 2011. Now presents with right arm weakness and numbness. She also has tingling sensations in right toes, concerning for new stroke. CT head is negative for acute intracranial abnormalities. Left cortical infarct seen on MRI. -will follow carotid duplex and 2-D echo -continue plavix for secondary prevention -appears to be secondary to small vessel disease -will follow neurology rec's -continue risk factors modifications  HLD:  -continue Lipitor and Zetia -LDL 96  HTN: -continue ramipril -IV hydralazine when necessary -will resume HCTZ as patient has passed swallowing test  Depression and anxiety: Stable, no suicidal or homicidal ideations. -Continue home medications: Amitriptyline and Wellbutrin, Valium  Anemia: Hemoglobin stable, 11.1 on admission -Follow-up with CBC -no signs of overt bleeding   DM-II: Last A1c 8.7 on  01/23/10 poorly controled at that time -Patient is taking Levemir, Janumet at home -will continue levemir (currently at 22 units for better control while inpatient); will also continue holding oral agents and continue SSI -will follow A1c  Code Status: Full Family Communication: son and husband at bedside  Disposition Plan: home with outpatient PT at discharge; will complete work up and follow rec's from neurology.   Consultants:  Neurology   Procedures:  See below for x-ray reports  2-D echo: pending  Carotid duple: pending   Antibiotics:  None   HPI/Subjective: Afebrile, no CP, no SOB. No dysarthria appreciated. Reports still some right side numbness.  Objective: Filed Vitals:   01/03/16 0628 01/03/16 0758  BP: 129/70 142/57  Pulse: 51 60  Temp: 98.1 F (36.7 C) 98.1 F (36.7 C)  Resp: 16 16    Intake/Output Summary (Last 24 hours) at 01/03/16 1136 Last data filed at 01/02/16 1946  Gross per 24 hour  Intake      0 ml  Output    300 ml  Net   -300 ml   Filed Weights   01/02/16 1747 01/02/16 2230  Weight: 122.018 kg (269 lb) 120.294 kg (265 lb 3.2 oz)    Exam:   General:  Afebrile, no dysarthria, oriented X3 and w/o focal neurologic complaints.   Cardiovascular: S1 and S2, no rubs or gallops  Respiratory: CTA bilaterally  Abdomen: soft, obese, NT, ND  Musculoskeletal: no cyanosis or clubbing; patient with some left LE joint pain (around her knee). Also with positive LE swelling bilateral (1+). MS 4/5 bilaterally due to poor effort (R > L)  Data Reviewed: Basic Metabolic Panel:  Recent Labs Lab 01/02/16 1759  NA 137  K 4.4  CL 105  CO2 27  GLUCOSE 223*  BUN 17  CREATININE 1.04*  CALCIUM 8.9   Liver Function Tests:  Recent Labs Lab 01/02/16 1759  AST 24  ALT 20  ALKPHOS 95  BILITOT 0.4  PROT 7.0  ALBUMIN 3.5   CBC:  Recent Labs Lab 01/02/16 1759  WBC 5.9  NEUTROABS 3.0  HGB 11.1*  HCT 33.9*  MCV 83.3  PLT 293    Cardiac Enzymes:  Recent Labs Lab 01/02/16 1759  TROPONINI <0.03    CBG:  Recent Labs Lab 01/02/16 1759 01/02/16 2315 01/03/16 0636  GLUCAP 199* 191* 164*   Studies: Ct Head Wo Contrast  01/02/2016  CLINICAL DATA:  Acute onset of worsening unilateral right-sided weakness. Tingling and heaviness in the right arm. Initial encounter. EXAM: CT HEAD WITHOUT CONTRAST TECHNIQUE: Contiguous axial images were obtained from the base of the skull through the vertex without intravenous contrast. COMPARISON:  CT of the head performed 01/22/2010, and MRI of the brain performed 01/23/2010 FINDINGS: There is no evidence of acute infarction, mass lesion, or intra- or extra-axial hemorrhage on CT. Prominence of sulci suggests mild cortical volume loss. There is chronic infarct at the left parietal and temporal lobes, with associated encephalomalacia. Mild periventricular white matter change likely reflects small vessel ischemic microangiopathy. The brainstem and fourth ventricle are within normal limits. The basal ganglia are unremarkable in appearance. The cerebral hemispheres demonstrate grossly normal gray-white differentiation. No mass effect or midline shift is seen. There is no evidence of fracture; visualized osseous structures are unremarkable in appearance. There is incomplete fusion of the posterior arch of C1. The orbits are within normal limits. The paranasal sinuses and mastoid air cells are well-aerated. No significant soft tissue abnormalities are seen. IMPRESSION: 1. No acute intracranial pathology seen on CT. 2. Mild cortical volume loss and scattered small vessel ischemic microangiopathy. 3. Chronic infarct at the left parietal and temporal lobes, with associated encephalomalacia. Electronically Signed   By: Roanna Raider M.D.   On: 01/02/2016 18:16   Mr Brain Wo Contrast  01/02/2016  CLINICAL DATA:  Initial evaluation for acute right upper extremity missed weakness. EXAM: MRI HEAD WITHOUT  CONTRAST MRA HEAD WITHOUT CONTRAST TECHNIQUE: Multiplanar, multiecho pulse sequences of the brain and surrounding structures were obtained without intravenous contrast. Angiographic images of the head were obtained using MRA technique without contrast. COMPARISON:  Prior CT from earlier the same day. FINDINGS: MRI HEAD FINDINGS Mildly advanced cerebral atrophy present. No significant chronic small vessel ischemic disease present. Encephalomalacia with associated gliosis within the left temporal occipital region, consistent with remote left MCA territory infarct. Small remote lacunar infarct within the left thalamus noted. Subtle 5 mm focus of diffusion abnormality within the left precentral gyrus suspicious for a small acute cortical infarction (series 4, image 42). This is also seen on coronal DWI sequence (series 5, image 14). No associated hemorrhage or mass effect. No other acute infarct identified. Gray-white matter differentiation maintained. Major intracranial vascular flow voids are preserved. No mass lesion, midline shift, or mass effect. No hydrocephalus. No extra-axial fluid collection. Major dural sinuses are grossly patent. Craniocervical junction normal. Visualized upper cervical spine unremarkable. Pituitary gland within normal limits. No acute abnormality about the orbits. Mild axial myopia noted. Mild mucosal thickening within the inferior maxillary sinuses. Paranasal sinuses are otherwise clear. No mastoid effusion. Inner ear structures normal. Bone marrow signal intensity within normal limits. No scalp soft tissue abnormality. MRA HEAD FINDINGS ANTERIOR CIRCULATION: Distal cervical segments of the internal carotid arteries  are patent with antegrade flow. Petrous segments patent bilaterally. Scattered atheromatous irregularity within the cavernous ICAs bilaterally. Mild to moderate diffuse narrowing of the cavernous right ICA. No significant narrowing on the left. A1 segments patent. Right A1  segment is hypoplastic. Anterior communicating artery normal. Anterior cerebral arteries well opacified to their distal aspects. M1 segments patent without stenosis or occlusion. MCA bifurcations normal. No proximal M2 branch occlusion or stenosis. Distal MCA branches well opacified and symmetric. Mild distal small vessel atheromatous irregularity. POSTERIOR CIRCULATION: Vertebral arteries code dominant and patent to the vertebrobasilar junction basilar artery widely patent. Superior cerebral arteries patent bilaterally. Both of the posterior cerebral arteries arise the basilar artery no well opacified to their distal aspects. Mild distal small vessel atheromatous irregularity within the PCA branches bilaterally. No aneurysm or vascular malformation. IMPRESSION: MRI HEAD IMPRESSION: 1. 5 mm subtle focus of diffusion abnormality within the left precentral gyrus. Given the patient's symptoms, finding is suspicious for a possible small acute nonhemorrhagic cortical infarct. 2. No other acute intracranial process. 3. Encephalomalacia within the left temporal occipital region, consistent with remote left MCA territory infarct. Additional remote lacunar left thalamic infarct. 4. Mild age-related cerebral atrophy. MRA HEAD IMPRESSION: 1. No large vessel occlusion within intracranial circulation. No high-grade or correctable stenosis. 2. Atheromatous irregularity within the cavernous ICAs bilaterally with mild to moderate diffuse narrowing on the right. Electronically Signed   By: Rise Mu M.D.   On: 01/02/2016 22:50   Mr Maxine Glenn Head/brain Wo Cm  01/02/2016  CLINICAL DATA:  Initial evaluation for acute right upper extremity missed weakness. EXAM: MRI HEAD WITHOUT CONTRAST MRA HEAD WITHOUT CONTRAST TECHNIQUE: Multiplanar, multiecho pulse sequences of the brain and surrounding structures were obtained without intravenous contrast. Angiographic images of the head were obtained using MRA technique without contrast.  COMPARISON:  Prior CT from earlier the same day. FINDINGS: MRI HEAD FINDINGS Mildly advanced cerebral atrophy present. No significant chronic small vessel ischemic disease present. Encephalomalacia with associated gliosis within the left temporal occipital region, consistent with remote left MCA territory infarct. Small remote lacunar infarct within the left thalamus noted. Subtle 5 mm focus of diffusion abnormality within the left precentral gyrus suspicious for a small acute cortical infarction (series 4, image 42). This is also seen on coronal DWI sequence (series 5, image 14). No associated hemorrhage or mass effect. No other acute infarct identified. Gray-white matter differentiation maintained. Major intracranial vascular flow voids are preserved. No mass lesion, midline shift, or mass effect. No hydrocephalus. No extra-axial fluid collection. Major dural sinuses are grossly patent. Craniocervical junction normal. Visualized upper cervical spine unremarkable. Pituitary gland within normal limits. No acute abnormality about the orbits. Mild axial myopia noted. Mild mucosal thickening within the inferior maxillary sinuses. Paranasal sinuses are otherwise clear. No mastoid effusion. Inner ear structures normal. Bone marrow signal intensity within normal limits. No scalp soft tissue abnormality. MRA HEAD FINDINGS ANTERIOR CIRCULATION: Distal cervical segments of the internal carotid arteries are patent with antegrade flow. Petrous segments patent bilaterally. Scattered atheromatous irregularity within the cavernous ICAs bilaterally. Mild to moderate diffuse narrowing of the cavernous right ICA. No significant narrowing on the left. A1 segments patent. Right A1 segment is hypoplastic. Anterior communicating artery normal. Anterior cerebral arteries well opacified to their distal aspects. M1 segments patent without stenosis or occlusion. MCA bifurcations normal. No proximal M2 branch occlusion or stenosis. Distal  MCA branches well opacified and symmetric. Mild distal small vessel atheromatous irregularity. POSTERIOR CIRCULATION: Vertebral arteries code dominant and patent  to the vertebrobasilar junction basilar artery widely patent. Superior cerebral arteries patent bilaterally. Both of the posterior cerebral arteries arise the basilar artery no well opacified to their distal aspects. Mild distal small vessel atheromatous irregularity within the PCA branches bilaterally. No aneurysm or vascular malformation. IMPRESSION: MRI HEAD IMPRESSION: 1. 5 mm subtle focus of diffusion abnormality within the left precentral gyrus. Given the patient's symptoms, finding is suspicious for a possible small acute nonhemorrhagic cortical infarct. 2. No other acute intracranial process. 3. Encephalomalacia within the left temporal occipital region, consistent with remote left MCA territory infarct. Additional remote lacunar left thalamic infarct. 4. Mild age-related cerebral atrophy. MRA HEAD IMPRESSION: 1. No large vessel occlusion within intracranial circulation. No high-grade or correctable stenosis. 2. Atheromatous irregularity within the cavernous ICAs bilaterally with mild to moderate diffuse narrowing on the right. Electronically Signed   By: Rise Mu M.D.   On: 01/02/2016 22:50    Scheduled Meds: . atorvastatin  20 mg Oral q1800  . B-complex with vitamin C  1 tablet Oral Daily  . buPROPion  150 mg Oral QODAY  . clopidogrel  75 mg Oral Daily  . enoxaparin (LOVENOX) injection  40 mg Subcutaneous QHS  . ezetimibe  10 mg Oral Daily  . insulin aspart  0-9 Units Subcutaneous TID WC  . insulin detemir  18 Units Subcutaneous QHS  . multivitamin with minerals  1 tablet Oral Daily  . omega-3 acid ethyl esters  1 g Oral QHS  . ramipril  10 mg Oral Daily   Continuous Infusions: . sodium chloride 75 mL/hr at 01/02/16 2335    Principal Problem:   Stroke [facility]) Active Problems:   Anemia, unspecified   Dyslipidemia    Hypertension   Anemia   Depression with anxiety   Right arm weakness    Time spent: 30 minutes    Vassie Loll  [facility] Pager [phone]. If 7PM-7AM, please contact night-coverage at [email or web address], password [email or web address] 01/03/2016, 11:36 AM

## 2016-01-03 NOTE — Progress Notes (Addendum)
STROKE TEAM PROGRESS NOTE   HISTORY OF PRESENT ILLNESS (per record) Catherine Bautista is an 65 y.o. female with a history of diabetes mellitus, hypertension, hyperlipidemia and 2 previous strokes, presenting with new onset right upper extremity weakness. Patient had a sudden onset of inability to hold objects with her right hand, and the right arm felt heavy. There was also numbness of her right upper extremity. She had no change in speech. She didn't notice any facial droop. She had no symptoms involving right lower extremity. She's been taking Plavix 75 mg per day for antiplatelet therapy. CT scan of the head showed no acute changes. Chronic infarct involving left parietal and temporal lobes with encephalomalacia was noted. NIH stroke score was 3.  LSN: 5:00 PM on 01/02/2016 tPA Given: No: Minimal deficits mRankin:   SUBJECTIVE (INTERVAL HISTORY) The patient feels she is back to baseline.I obtained detailed history of present illness from the patient and her prior stroke history and reviewed prior workup. She states sh ehad previous fintolerance to aspirin following gastric bypass surgery but the surgery has been reversed and she will be able to tolerate aspirin now.  OBJECTIVE Temp:  [98 F (36.7 C)-98.6 F (37 C)] 98.1 F (36.7 C) (06/04 0758) Pulse Rate:  [48-76] 60 (06/04 0758) Cardiac Rhythm:  [-] Heart block (06/03 2302) Resp:  [12-18] 16 (06/04 0758) BP: (125-148)/(57-78) 142/57 mmHg (06/04 0758) SpO2:  [95 %-100 %] 97 % (06/04 0758) Weight:  [120.294 kg (265 lb 3.2 oz)-122.018 kg (269 lb)] 120.294 kg (265 lb 3.2 oz) (06/03 2230)  CBC:   Recent Labs Lab 01/02/16 1759  WBC 5.9  NEUTROABS 3.0  HGB 11.1*  HCT 33.9*  MCV 83.3  PLT 293    Basic Metabolic Panel:   Recent Labs Lab 01/02/16 1759  NA 137  K 4.4  CL 105  CO2 27  GLUCOSE 223*  BUN 17  CREATININE 1.04*  CALCIUM 8.9    Lipid Panel:     Component Value Date/Time   CHOL 153 01/03/2016 0447   TRIG 69  01/03/2016 0447   HDL 43 01/03/2016 0447   CHOLHDL 3.6 01/03/2016 0447   VLDL 14 01/03/2016 0447   LDLCALC 96 01/03/2016 0447   HgbA1c:  Lab Results  Component Value Date   HGBA1C * 01/23/2010    8.7 (NOTE)                                                                       According to the ADA Clinical Practice Recommendations for 2011, when HbA1c is used as a screening test:   >=6.5%   Diagnostic of Diabetes Mellitus           (if abnormal result  is confirmed)  5.7-6.4%   Increased risk of developing Diabetes Mellitus  References:Diagnosis and Classification of Diabetes Mellitus,Diabetes Care,2011,34(Suppl 1):S62-S69 and Standards of Medical Care in         Diabetes - 2011,Diabetes Care,2011,34  (Suppl 1):S11-S61.   Urine Drug Screen:     Component Value Date/Time   LABOPIA NONE DETECTED 01/02/2016 1946   COCAINSCRNUR NONE DETECTED 01/02/2016 1946   LABBENZ NONE DETECTED 01/02/2016 1946   AMPHETMU NONE DETECTED 01/02/2016 1946   THCU NONE DETECTED 01/02/2016 1946  LABBARB NONE DETECTED 01/02/2016 1946      IMAGING  Ct Head Wo Contrast 01/02/2016   1. No acute intracranial pathology seen on CT.  2. Mild cortical volume loss and scattered small vessel ischemic microangiopathy.  3. Chronic infarct at the left parietal and temporal lobes, with associated encephalomalacia.     Mr Maxine Glenn Head/brain Wo Cm 01/02/2016    MRI HEAD  1. 5 mm subtle focus of diffusion abnormality within the left precentral gyrus. Given the patient's symptoms, finding is suspicious for a possible small acute nonhemorrhagic cortical infarct.  2. No other acute intracranial process.  3. Encephalomalacia within the left temporal occipital region, consistent with remote left MCA territory infarct. Additional remote lacunar left thalamic infarct.  4. Mild age-related cerebral atrophy.    MRA HEAD  1. No large vessel occlusion within intracranial circulation. No high-grade or correctable stenosis.  2.  Atheromatous irregularity within the cavernous ICAs bilaterally with mild to moderate diffuse narrowing on the right.     PHYSICAL EXAM Pleasant elderly african american lady not in distress. . Afebrile. Head is nontraumatic. Neck is supple without bruit.    Cardiac exam no murmur or gallop. Lungs are clear to auscultation. Distal pulses are well felt. Neurological Exam ;  Awake  Alert oriented x 3. Normal speech and language.eye movements full without nystagmus.fundi were not visualized. Vision acuity and fields appear normal. Hearing is normal. Palatal movements are normal. Face symmetric. Tongue midline. Normal strength, tone, reflexes and coordination.mild diminished fine finger movements on the right. Slight weakness of right grip. Orbits left over right upper extremity. Normal sensation. Gait deferred.      ASSESSMENT/PLAN Catherine Bautista is a 65 y.o. female with history of diabetes mellitus, hypertension, hyperlipidemia, previous strokes, obesity, and fibromyalgia presenting with right upper extremity numbness and weakness.  She did not receive IV t-PA due to minimal deficits.  Stroke:  small left frontal cortical infarct - possibly embolic.  Resultant mild right upper extremity weakness  MRI 5 mm subtle focus of diffusion abnormality within the left precentral gyrus.   MRA  mild to moderate narrowing of the right internal carotid artery.  Carotid Doppler - pending  2D Echo pending  LDL - 96  HgbA1c pending  VTE prophylaxis - Lovenox Diet heart healthy/carb modified Room service appropriate?: Yes; Fluid consistency:: Thin  clopidogrel 75 mg daily prior to admission, now on Plavix 75 mg daily (Pt lists h/o ASA intolerance)  Patient counseled to be compliant with her antithrombotic medications  Ongoing aggressive stroke risk factor management  Therapy recommendations:  Outpatient physical therapy recommended.  Disposition:  Pending  Hypertension  Stable  Permissive  hypertension (OK if < 220/120) but gradually normalize in 5-7 days  Hyperlipidemia  Home meds:  Lipitor 20 mg daily resumed in hospital  LDL 96, goal < 70  Increase Lipitor to 40 mg daily  Continue statin at discharge  Diabetes  HgbA1c pending, goal < 7.0  Uncontrolled  Other Stroke Risk Factors  Advanced age  Obesity, Body mass index is 42.82 kg/(m^2).   Hx stroke/TIA   Other Active Problems  Mild anemia  Mildly elevated creatinine - 1.04  ? Aspirin allergy ?   PLAN  TEE and possible loop tomorrow - message sent to Trish - NPO order written  Await 2-D echo, hemoglobin A1c, carotid Dopplers, and therapy evaluations.   Hospital day #   Delton See PA-C [facility] Pager [phone] 01/03/2016, 12:54 PM I have personally  examined this patient, reviewed notes, independently viewed imaging studies, participated in medical decision making and plan of care. I have made any additions or clarifications directly to the above note. Agree with note above. She presented with sudden onset of right hand weakness due to embolic eft frontal cortical infarct etiology to be determined. She remains at risk for neurological worsening, recurrent stroke, TIA needs ongoing stroke evaluation and aggressive risk factor modification. Recommend change to Plavix to aspirin daily for stroke prevention. We will order transesophageal echo and loop recorder to look for paroxysmal atrial fibrillation.Greater than 50% time in this 35 minute visit was spent on counseling and coordination of care about stroke risk and stroke prevention. Delia HeadyPramod Sethi, MD Medical Director [facility] Pager: [phone] 01/03/2016 1:27 PM     To contact Stroke Continuity provider, please refer to [email or web address]. After hours, contact General Neurology

## 2016-01-03 NOTE — Progress Notes (Signed)
Occupational Therapy Evaluation/Discharge Patient Details Name: Catherine Bautista MRN: [id number] DOB: Aug 16, 1950 Today's Date: 01/03/2016    History of Present Illness Pt is a 65 y/o female who presents with RUE weakness and numbness. PMH is significant for prior CVA, anemia, obesity, fibromyalgia, HTN, DM, depression, anxiety. MRI revealed a 5mm abnormality and finding was noted to be suspicious for a possible small acute nonhemorrhagic cortical infarct.   Clinical Impression   Patient has been evaluated by Occupational Therapy with no acute OT needs identified. Pt's strength and sensation appears to be equal on both sides and pt reports all her previous symptoms have resolved and she is back to her baseline. Pt completed all ADLs and transfers with modified independence with no LOB or concerns for safety noted. All education has been completed and pt has no further questions. Pt with no further acute OT needs. OT signing off.    Follow Up Recommendations  No OT follow up;Supervision - Intermittent    Equipment Recommendations  None recommended by OT    Recommendations for Other Services       Precautions / Restrictions Precautions Precautions: Fall Restrictions Weight Bearing Restrictions: No      Mobility Bed Mobility Overal bed mobility: Modified Independent                Transfers Overall transfer level: Modified independent Equipment used: None                  Balance Overall balance assessment: No apparent balance deficits (not formally assessed)                                          ADL Overall ADL's : Modified independent                                             Vision Vision Assessment?: Yes Eye Alignment: Within Functional Limits Ocular Range of Motion: Within Functional Limits Alignment/Gaze Preference: Within Defined Limits Tracking/Visual Pursuits: Able to track stimulus in all quads without  difficulty Saccades: Within functional limits Convergence: Within functional limits Visual Fields: No apparent deficits   Perception     Praxis      Pertinent Vitals/Pain Pain Assessment: 0-10 Pain Score: 7  Pain Location: LLE laterally Pain Descriptors / Indicators: Aching;Burning Pain Intervention(s): Limited activity within patient's tolerance;Monitored during session;Repositioned;RN gave pain meds during session     Hand Dominance Right   Extremity/Trunk Assessment Upper Extremity Assessment Upper Extremity Assessment: Overall WFL for tasks assessed RUE Deficits / Details: No deficits noted this session - equally bilaterally and pt reports no numbness or tingling   Lower Extremity Assessment Lower Extremity Assessment: Defer to PT evaluation   Cervical / Trunk Assessment Cervical / Trunk Assessment: Normal   Communication Communication Communication: No difficulties   Cognition Arousal/Alertness: Awake/alert Behavior During Therapy: WFL for tasks assessed/performed Overall Cognitive Status: Within Functional Limits for tasks assessed                     General Comments       Exercises       Shoulder Instructions      Home Living Family/patient expects to be discharged to:: Private residence Living Arrangements: Spouse/significant other Available Help at Discharge: Family;Available  24 hours/day Type of Home: House Home Access: Stairs to enter [employer] of Steps: 3 Entrance Stairs-Rails: None Home Layout: Two level;Laundry or work area in basement     [employer]: Producer, television/film/video: Pharmacist, community: Yes   Home Equipment: Hand held shower head          Prior Functioning/Environment Level of Independence: Independent        Comments: Professor for Product manager    OT Diagnosis: Acute pain   OT Problem List: Decreased safety awareness;Obesity;Pain   OT  Treatment/Interventions:      OT Goals(Current goals can be found in the care plan section) Acute Rehab OT Goals Patient Stated Goal: Return home OT Goal Formulation: With patient Time For Goal Achievement: 01/17/16 Potential to Achieve Goals: Good  OT Frequency:     Barriers to D/C:            Co-evaluation              End of Session Equipment Utilized During Treatment: Gait belt Nurse Communication: Mobility status  Activity Tolerance: Patient tolerated treatment well Patient left: in chair;with call bell/phone within reach   Time: 1230-1242 OT Time Calculation (min): 12 min Charges:  OT General Charges $OT Visit: 1 Procedure OT Evaluation $OT Eval Low Complexity: 1 Procedure G-Codes: OT G-codes **NOT FOR INPATIENT CLASS** Functional Assessment Tool Used: clinical judgement Functional Limitation: Self care Self Care Current Status (Z6109): 0 percent impaired, limited or restricted Self Care Goal Status (U0454): 0 percent impaired, limited or restricted Self Care Discharge Status (U9811): 0 percent impaired, limited or restricted  Nils Pyle, OTR/L Pager: [phone] 01/03/2016, 2:06 PM

## 2016-01-03 NOTE — Evaluation (Signed)
Physical Therapy Evaluation and Discharge Patient Details Name: Catherine Bautista MRN: [id number] DOB: 11-24-1950 Today's Date: 01/03/2016   History of Present Illness  Pt is a 65 y/o female who presents with RUE weakness and numbness. PMH is significant for prior CVA, anemia, obesity, fibromyalgia, HTN, DM, depression, anxiety. MRI revealed a 5mm abnormality and finding was noted to be suspicious for a possible small acute nonhemorrhagic cortical infarct.  Clinical Impression  Patient evaluated by Physical Therapy with no further acute PT needs identified. All education has been completed and the patient has no further questions. At the time of PT eval pt was able to perform transfers and ambulation with modified independence. Pt continues to report some sensation changes in the R side, and noted minor R-sided weakness upon MMT. Feel pt is doing well enough to follow-up with outpatient PT to address these deficits. Discussed with pt and she is in agreement. See below for any follow-up Physical Therapy or equipment needs. PT is signing off. Thank you for this referral.     Follow Up Recommendations Outpatient PT;Supervision for mobility/OOB    Equipment Recommendations  None recommended by PT    Recommendations for Other Services       Precautions / Restrictions Precautions Precautions: Fall Restrictions Weight Bearing Restrictions: No      Mobility  Bed Mobility Overal bed mobility: Modified Independent                Transfers Overall transfer level: Modified independent Equipment used: None             General transfer comment: No unsteadiness or LOB noted. No assist required.   Ambulation/Gait Ambulation/Gait assistance: Modified independent (Device/Increase time) Ambulation Distance (Feet): 600 Feet Assistive device: None Gait Pattern/deviations: Step-through pattern;Decreased stride length;Trendelenburg     General Gait Details: Minor trendelenberg noted due to  baseline hip abductor weakness. Pt reports she typically is a fast walker and states her pace today is slowed. No unsteadiness or LOB noted.   Stairs            Wheelchair Mobility    Modified Rankin (Stroke Patients Only) Modified Rankin (Stroke Patients Only) Pre-Morbid Rankin Score: No symptoms Modified Rankin: No significant disability     Balance Overall balance assessment: Needs assistance                           High level balance activites: Head turns High Level Balance Comments: Pt reports feeling of imbalance with R/L headturns and slightly decreases gait speed however does not require assistance and no LOB was noted.              Pertinent Vitals/Pain Pain Assessment: 0-10 Pain Score: 7  Pain Location: L lower leg laterally Pain Descriptors / Indicators: Aching;Burning Pain Intervention(s): Limited activity within patient's tolerance;Monitored during session;Repositioned    Home Living Family/patient expects to be discharged to:: Private residence Living Arrangements: Spouse/significant other Available Help at Discharge: Family;Available 24 hours/day Type of Home: House Home Access: Stairs to enter Entrance Stairs-Rails: None Entrance Stairs-Number of Steps: 3 Home Layout: Two level;Laundry or work area in [employer] Mutual Insurancebasement Home Equipment: None      Prior Function Level of Independence: Independent         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 Pt reports decreased light touch from shoulder to elbow and in hand. Forearm she reports feels normal during testing. Please see OT note for more detail.          Lower Extremity Assessment: RLE deficits/detail;LLE deficits/detail RLE Deficits / Details: Noted  decreased strength in RLE (grossly 4/5 in quads/hams) and equal strength in hip flexors (R=L). Pt also reports some decreased light touch in the RLE from hip to knee. LLE Deficits / Details: Pt reports area of L lateral lower leg pain. Upon examination, appears swollen, no redness or temperature change noted. Pain increases with palpation. Ice pack provided, LE's elevated, and RN notified. Pt reports pain did not change with activity.   Cervical / Trunk Assessment: Normal  Communication   Communication: No difficulties  Cognition Arousal/Alertness: Awake/alert Behavior During Therapy: WFL for tasks assessed/performed Overall Cognitive Status: Within Functional Limits for tasks assessed                      General Comments      Exercises        Assessment/Plan    PT Assessment Patent does not need any further PT services  PT Diagnosis Difficulty walking   PT Problem List    PT Treatment Interventions     PT Goals (Current goals can be found in the Care Plan section) Acute Rehab PT Goals Patient Stated Goal: Return home PT Goal Formulation: With patient/family Time For Goal Achievement: 01/10/16 Potential to Achieve Goals: Good    Frequency     Barriers to discharge        Co-evaluation               End of Session Equipment Utilized During Treatment: Gait belt Activity Tolerance: Patient tolerated treatment well Patient left: in chair;with call bell/phone within reach;with family/visitor present Nurse Communication: Mobility status (LLE pain)    Functional Assessment Tool Used: Clinical judgement Functional Limitation: Mobility: Walking and moving around Mobility: Walking and Moving Around Current Status [phone]): At least 1 percent but less than 20 percent impaired, limited or restricted Mobility: Walking and Moving Around Goal Status [phone]): At least 1 percent but less than 20 percent impaired, limited or restricted Mobility: Walking and Moving  Around Discharge Status [phone]): At least 1 percent but less than 20 percent impaired, limited or restricted    Time: 0830-0857 PT Time Calculation (min) (ACUTE ONLY): 27 min   Charges:   PT Evaluation $PT Eval Moderate Complexity: 1 Procedure PT Treatments $Gait Training: 8-22 mins   PT G Codes:   PT G-Codes **NOT FOR INPATIENT CLASS** Functional Assessment Tool Used: Clinical judgement Functional Limitation: Mobility: Walking and moving around Mobility: Walking and Moving Around Current Status (B1478): At least 1 percent but less than 20 percent impaired, limited or restricted Mobility: Walking and Moving Around Goal Status [phone]): At least 1 percent but less than 20 percent impaired, limited or restricted Mobility: Walking and Moving Around Discharge Status [phone]): At least 1 percent but less than 20 percent impaired, limited or restricted    Conni Slipper 01/03/2016, 9:17 AM   Conni Slipper, PT, DPT Acute Rehabilitation Services Pager: [phone]

## 2016-01-04 ENCOUNTER — Inpatient Hospital Stay (HOSPITAL_COMMUNITY): Payer: BC Managed Care – PPO

## 2016-01-04 DIAGNOSIS — I63412 Cerebral infarction due to embolism of left middle cerebral artery: Secondary | ICD-10-CM | POA: Insufficient documentation

## 2016-01-04 DIAGNOSIS — I63311 Cerebral infarction due to thrombosis of right middle cerebral artery: Secondary | ICD-10-CM

## 2016-01-04 DIAGNOSIS — I10 Essential (primary) hypertension: Secondary | ICD-10-CM | POA: Insufficient documentation

## 2016-01-04 DIAGNOSIS — I639 Cerebral infarction, unspecified: Secondary | ICD-10-CM

## 2016-01-04 LAB — GLUCOSE, CAPILLARY
Glucose-Capillary: 183 mg/dL — ABNORMAL HIGH (ref 65–99)
Glucose-Capillary: 112 mg/dL — ABNORMAL HIGH (ref 65–99)
Glucose-Capillary: 154 mg/dL — ABNORMAL HIGH (ref 65–99)
Glucose-Capillary: 223 mg/dL — ABNORMAL HIGH (ref 65–99)

## 2016-01-04 MED ORDER — SODIUM CHLORIDE 0.9 % IV SOLN
INTRAVENOUS | Status: DC
  Administered 2016-01-05 (×2): via INTRAVENOUS

## 2016-01-04 NOTE — Evaluation (Addendum)
Speech Language Pathology Evaluation Patient Details Name: Catherine Bautista MRN: [id number] Catherine [birth year] Today's Date: 01/04/2016 Time: 6213-0865 SLP Time Calculation (min) (ACUTE ONLY): 25 min  Problem List:  Patient Active Problem List   Diagnosis Date Noted  . Acute CVA (cerebrovascular accident) (HCC) 01/03/2016  . Dyslipidemia   . Hypertension   . Stroke (HCC)   . Anemia   . Depression with anxiety   . Right arm weakness   . Anemia, unspecified 05/21/2011   Past Medical History:  Past Medical History  Diagnosis Date  . Anemia   . Anemia, vitamin B12 deficiency   . Obesity   . Fibromyalgia   . Dyslipidemia   . Hypertension   . Diabetes mellitus   . Stroke (HCC)   . Depression with anxiety    Past Surgical History:  Past Surgical History  Procedure Laterality Date  . Gastric bypass  1981  . Cholecystectomy      laproscopic  . Abdominal hysterectomy  2007  . Therapuetic abortion  1986    associated with C-section   HPI:  65 y.o. female with medical history significant of stroke, anemia, obesity, fibromyalgia, hypertension, hyperlipidemia, diabetes mellitus, depression, anxiety, who presents with right arm weakness and numbness; pt with hx of previous CVA's in 2002, 2011 with residual aphasia (minimal, using compensatory strategies independently); MRI on 01/02/16 indicated 5 mm subtle focus of diffusion abnormality within the left precentral gyrus. Given the patient's symptoms, finding is suspicious for a possible small acute nonhemorrhagic cortical infarct.   Assessment / Plan / Recommendation Clinical Impression   Pt presented with intelligible speech (100% in complex conversation), receptive and expressive language and cognition all appear [facility] for tasks given; pt stated she has a residual expressive aphasia from CVA in 2002, but she utilizes compensatory strategies independently.  No further ST services recommended at this time.    SLP Assessment  Patient does not need  any further Speech Language Pathology Services    Follow Up Recommendations  None    Frequency and Duration   n/a        SLP Evaluation Prior Functioning  Cognitive/Linguistic Baseline: Within functional limits Type of Home: House  Lives With: Spouse Available Help at Discharge: Family;Friend(s);Available 24 hours/day Education: PhD in Cultural Studies Vocation: Full time employment   Cognition  Overall Cognitive Status: Within Functional Limits for tasks assessed Arousal/Alertness: Awake/alert Orientation Level: Oriented X4 Safety/Judgment: Appears intact    Comprehension  Auditory Comprehension Overall Auditory Comprehension: Appears within functional limits for tasks assessed Yes/No Questions: Within Functional Limits Commands: Within Functional Limits Conversation: Complex Visual Recognition/Discrimination Discrimination: Within Function Limits Reading Comprehension Reading Status: Within funtional limits    Expression Expression Primary Mode of Expression: Verbal Verbal Expression Overall Verbal Expression: Appears within functional limits for tasks assessed Initiation: No impairment Level of Generative/Spontaneous Verbalization: Conversation Repetition: No impairment Naming: No impairment Pragmatics: No impairment Non-Verbal Means of Communication: Not applicable Other Verbal Expression Comments: Pt stated she has residual aphasia from prior CVA, but uses compensatory strategies independently Written Expression Dominant Hand: Right Written Expression: Not tested   Oral / Motor  Oral Motor/Sensory Function Overall Oral Motor/Sensory Function: Within functional limits Motor Speech Overall Motor Speech: Appears within functional limits for tasks assessed Respiration: Within functional limits Phonation: Normal Resonance: Within functional limits Articulation: Within functional limitis Intelligibility: Intelligible Motor Planning: Witnin functional  limits Motor Speech Errors: Not applicable  Kelsey Edman,PAT, M.S., CCC-SLP 01/04/2016, 2:12 PM

## 2016-01-04 NOTE — Progress Notes (Signed)
STROKE TEAM PROGRESS NOTE   HISTORY OF PRESENT ILLNESS (per record) Margot ChimesLou S Reading is an 65 y.o. female with a history of diabetes mellitus, hypertension, hyperlipidemia and 2 previous strokes, presenting with new onset right upper extremity weakness. Patient had a sudden onset of inability to hold objects with her right hand, and the right arm felt heavy. There was also numbness of her right upper extremity. She had no change in speech. She didn't notice any facial droop. She had no symptoms involving right lower extremity. She's been taking Plavix 75 mg per day for antiplatelet therapy. CT scan of the head showed no acute changes. Chronic infarct involving left parietal and temporal lobes with encephalomalacia was noted. NIH stroke score was 3. She was LKW 5:00 PM on 01/02/2016. IV TPA was not administered secondary to minimal deficits. She was admitted for further surgical evaluation and treatment.   SUBJECTIVE (INTERVAL HISTORY) Husband at bedside. She has old left MCA infarct in 2002 and right frontal cortical infarct in 12/2013. No residue left over. This time right UE numbness and weakness also resolved. Pending TEE and loop tomorrow. CUS and DVT negative.    OBJECTIVE Temp:  [97.6 F (36.4 C)-98.7 F (37.1 C)] 97.6 F (36.4 C) (06/05 0537) Pulse Rate:  [48-64] 55 (06/05 0537) Cardiac Rhythm:  [-] Heart block (06/05 0700) Resp:  [16-18] 18 (06/05 0537) BP: (98-142)/(49-62) 132/57 mmHg (06/05 0537) SpO2:  [93 %-100 %] 93 % (06/05 0537)  CBC:   Recent Labs Lab 01/02/16 1759  WBC 5.9  NEUTROABS 3.0  HGB 11.1*  HCT 33.9*  MCV 83.3  PLT 293    Basic Metabolic Panel:   Recent Labs Lab 01/02/16 1759  NA 137  K 4.4  CL 105  CO2 27  GLUCOSE 223*  BUN 17  CREATININE 1.04*  CALCIUM 8.9    Lipid Panel:     Component Value Date/Time   CHOL 153 01/03/2016 0447   TRIG 69 01/03/2016 0447   HDL 43 01/03/2016 0447   CHOLHDL 3.6 01/03/2016 0447   VLDL 14 01/03/2016 0447   LDLCALC 96 01/03/2016 0447   HgbA1c:  Lab Results  Component Value Date   HGBA1C * 01/23/2010    8.7 (NOTE)                                                                       According to the ADA Clinical Practice Recommendations for 2011, when HbA1c is used as a screening test:   >=6.5%   Diagnostic of Diabetes Mellitus           (if abnormal result  is confirmed)  5.7-6.4%   Increased risk of developing Diabetes Mellitus  References:Diagnosis and Classification of Diabetes Mellitus,Diabetes Care,2011,34(Suppl 1):S62-S69 and Standards of Medical Care in         Diabetes - 2011,Diabetes Care,2011,34  (Suppl 1):S11-S61.   Urine Drug Screen:     Component Value Date/Time   LABOPIA NONE DETECTED 01/02/2016 1946   COCAINSCRNUR NONE DETECTED 01/02/2016 1946   LABBENZ NONE DETECTED 01/02/2016 1946   AMPHETMU NONE DETECTED 01/02/2016 1946   THCU NONE DETECTED 01/02/2016 1946   LABBARB NONE DETECTED 01/02/2016 1946      IMAGING I have personally reviewed the  radiological images below and agree with the radiology interpretations.  Ct Head Wo Contrast 01/02/2016   1. No acute intracranial pathology seen on CT.  2. Mild cortical volume loss and scattered small vessel ischemic microangiopathy.  3. Chronic infarct at the left parietal and temporal lobes, with associated encephalomalacia.   MRI HEAD  01/02/2016   1. 5 mm subtle focus of diffusion abnormality within the left precentral gyrus. Given the patient's symptoms, finding is suspicious for a possible small acute nonhemorrhagic cortical infarct.  2. No other acute intracranial process.  3. Encephalomalacia within the left temporal occipital region, consistent with remote left MCA territory infarct. Additional remote lacunar left thalamic infarct.  4. Mild age-related cerebral atrophy.   MRA HEAD  01/02/2016   1. No large vessel occlusion within intracranial circulation. No high-grade or correctable stenosis.  2. Atheromatous  irregularity within the cavernous ICAs bilaterally with mild to moderate diffuse narrowing on the right.   2-D echocardiogram - Left ventricle: The cavity size was normal. Wall thickness was normal. Systolic function was normal. The estimated ejection fraction was in the range of 60% to 65%. Wall motion was normal; there were no regional wall motion abnormalities. Doppler parameters are consistent with abnormal left ventricular relaxation (grade 1 diastolic dysfunction).  Carotid Doppler   There is 1-39% bilateral ICA stenosis. Vertebral artery flow is antegrade. '  Lower extremity venous Doppler There is no DVT or SVT noted in the bilateral lower extremities.    PHYSICAL EXAM Pleasant elderly african american lady not in distress. . Afebrile. Head is nontraumatic. Neck is supple without bruit.    Cardiac exam no murmur or gallop. Lungs are clear to auscultation. Distal pulses are well felt. Neurological Exam ;  Awake  Alert oriented x 3. Normal speech and language.eye movements full without nystagmus.fundi were not visualized. Vision acuity and fields appear normal. Hearing is normal. Palatal movements are normal. Face symmetric. Tongue midline. Normal strength, tone, reflexes and coordination. Normal sensation. Gait deferred.   ASSESSMENT/PLAN Ms. AMELIE CARACCI is a 65 y.o. female with history of diabetes mellitus, hypertension, hyperlipidemia, previous strokes, obesity, and fibromyalgia presenting with right upper extremity numbness and weakness.  She did not receive IV t-PA due to minimal deficits.  Stroke:  punctate left frontal cortical infarct - possibly embolic.  Resultant resolved  MRI 5 mm subtle focus of diffusion abnormality within the left precentral gyrus.   MRA  mild to moderate narrowing of the right internal carotid artery.  Carotid Doppler - no significant stenosis  2D Echo EF 60-65%. No source of embolus   Lower extremity venous Doppler negative TEE to look for  embolic source. Arranged with [facility] for tomorrow. (I have made patient NPO after midnight tonight). If TEE negative, a [facility] electrophysiologist will consult and consider placement of an implantable loop recorder to evaluate for atrial fibrillation as etiology of stroke. This has been explained to patient/family by Dr. Roda Shutters and they are agreeable.   LDL - 96  HgbA1c pending  VTE prophylaxis - Lovenox Diet NPO time specified Except for: Sips with Meds Diet heart healthy/carb modified Room service appropriate?: Yes; Fluid consistency:: Thin  clopidogrel 75 mg daily prior to admission, now on ASA  daily.  Patient counseled to be compliant with her antithrombotic medications  Ongoing aggressive stroke risk factor management  Therapy recommendations:  Outpatient physical therapy, no OT  Disposition:  Return home  History of stroke  2002, left  MCA infarct  12/2009, right frontal cortical infarct  No residue  Hypertension  Stable  Permissive hypertension (OK if < 220/120) but gradually normalize in 5-7 days  Hyperlipidemia  Home meds:  Lipitor 20 mg daily resumed in hospital  LDL 96, goal < 70  Increased Lipitor to 40 mg daily  Continue statin at discharge  Diabetes type II  HgbA1c pending, goal < 7.0  Uncontrolled  Hyperglycemia  SSI   On levemir  Other Stroke Risk Factors  Advanced age  Obesity, Body mass index is 42.82 kg/(m^2).   Hx stroke/TIA  Other Active Problems  Mild anemia  Mildly elevated creatinine - 1.04  Depression and anxiety  Hospital day # 1  Marvel Plan, MD PhD Stroke Neurology 01/04/2016 11:14 PM      To contact Stroke Continuity provider, please refer to [email or web address]. After hours, contact General Neurology

## 2016-01-04 NOTE — Progress Notes (Signed)
VASCULAR LAB PRELIMINARY  PRELIMINARY  PRELIMINARY  PRELIMINARY  Bilateral lower extremity venous duplex completed.    Preliminary report:  There is no DVT or SVT noted in the bilateral lower extremities.   Blakelee Allington, RVT 01/04/2016, 10:51 AM

## 2016-01-04 NOTE — Progress Notes (Signed)
VASCULAR LAB PRELIMINARY  PRELIMINARY  PRELIMINARY  PRELIMINARY  Carotid duplex completed.    Preliminary report:  1-39% ICA stenosis. Vertebral artery flow is antegrade.   Aamina Skiff, RVT 01/04/2016, 10:52 AM

## 2016-01-04 NOTE — Progress Notes (Addendum)
    [facility] has been requested to perform a transesophageal echocardiogram with loop recorder placement on Catherine Bautista for CVA on 01/05/16.  After careful review of history and examination, the risks and benefits of transesophageal echocardiogram have been explained including risks of esophageal damage, perforation (1:10,000 risk), bleeding, pharyngeal hematoma as well as other potential complications associated with conscious sedation including aspiration, arrhythmia, respiratory failure and death. Alternatives to treatment were discussed, questions were answered. Patient is willing to proceed. NPO after midnight  Cline CrockKathryn Janea Schwenn, [place] JerseyPA-C 01/04/2016 4:32 PM

## 2016-01-04 NOTE — Care Management Note (Signed)
Case Management Note  Patient Details  Name: Margot ChimesLou S Stavros MRN: [id number] Date of Birth: 10/30/1950  Subjective/Objective:  Pt admitted with CVA. She is from home with her spouse.                 Action/Plan: PT rec is for outpatient services. CM following for d/c needs.   Expected Discharge Date:                  Expected Discharge Plan:  Home/Self Care  In-House Referral:     Discharge planning Services     Post Acute Care Choice:    Choice offered to:     DME Arranged:    DME Agency:     HH Arranged:    HH Agency:     Status of Service:  In process, will continue to follow  Medicare Important Message Given:    Date Medicare IM Given:    Medicare IM give by:    Date Additional Medicare IM Given:    Additional Medicare Important Message give by:     If discussed at Long Length of Stay Meetings, dates discussed:    Additional Comments:  Kermit BaloKelli F Ruthy Forry, RN 01/04/2016, 1:43 PM

## 2016-01-04 NOTE — Progress Notes (Signed)
[facility] PROGRESS NOTE  ROSEZELLA KRONICK ZOX:[id number] DOB: 1951/06/14 DOA: 01/02/2016 PCP: No PCP Per Patient  Interim summary and HPI 65 y.o. female with medical history significant of stroke, anemia, obesity, fibromyalgia, hypertension, hyperlipidemia, diabetes mellitus, depression, anxiety, who presents with right arm weakness and numbness.  Patient reports that she started having right arm numbness and weakness at about 5 PM. It has improved since then, but still has some mild weakness and numbness. She also has tingling sensations in her right toes. No leg weakness. Patient does not have slurry speech, vision change, hearing loss. She feels nauseated, but no vomiting, abdominal pain or diarrhea. Patient does not have fever, chills, chest pain, shortness of breath, cough, symptoms of UTI.  Feeling better and complaining just of mild numbness/weakness on right side. Plan is to discharge Home with outpatient PT at discharge; will complete work up and follow rec's from neurology.   Assessment/Plan: Stroke [facility]): pt has history of stroke twice, last one was 2011. Now presents with right arm weakness and numbness. She also has tingling sensations in right toes, concerning for new stroke. CT head is negative for acute intracranial abnormalities. Left cortical infarct seen on MRI. -will continue plavix for secondary prevention currently -will follow neurology rec's; planning for TEE and loop recorder  -continue risk factors modifications  HLD:  -continue Lipitor and Zetia -LDL 96  HTN: -continue ramipril and HCTZ -IV hydralazine when necessary  Depression and anxiety: Stable, no suicidal or homicidal ideations. -Continue home medications: Amitriptyline and Wellbutrin, Valium  Anemia: Hemoglobin stable, 11.1 on admission -Follow-up with CBC -no signs of overt bleeding   DM-II: Last A1c 8.7 on 01/23/10 poorly controled at that time -Patient is taking Levemir, Janumet at home -will  continue levemir (currently at 22 units for better control while inpatient); will also continue holding oral agents and continue SSI -will follow A1c  Code Status: Full Family Communication: son and husband at bedside  Disposition Plan: home with outpatient PT at discharge; will complete work up and follow rec's from neurology.   Consultants:  Neurology   Cardiology consulted for TEE  Procedures:  See below for x-ray reports  2-D echo: pending  Carotid duple: 1-39% ICA stenosis. Vertebral artery flow is antegrade.   Lower extremity Dopplers: Negative for DVT, SVT or Baker's cyst bilaterally  Antibiotics:  None   HPI/Subjective: Afebrile, no CP, no SOB. No dysarthria appreciated. Patient with mild right lower extremity numbness and a slight weakness on her right grip.  Objective: Filed Vitals:   01/04/16 0537 01/04/16 1317  BP: 132/57 118/45  Pulse: 55 73  Temp: 97.6 F (36.4 C) 98.6 F (37 C)  Resp: 18 16    Intake/Output Summary (Last 24 hours) at 01/04/16 1521 Last data filed at 01/04/16 1318  Gross per 24 hour  Intake    240 ml  Output      0 ml  Net    240 ml   Filed Weights   01/02/16 1747 01/02/16 2230  Weight: 122.018 kg (269 lb) 120.294 kg (265 lb 3.2 oz)    Exam:   General:  Afebrile, no dysarthria, oriented X3 and w/o focal neurologic complaints. Able to follow commands properly and with good insight   Cardiovascular: S1 and S2, no rubs or gallops  Respiratory: CTA bilaterally  Abdomen: soft, obese, NT, ND  Musculoskeletal: no cyanosis or clubbing; patient with some left LE joint pain (around her knee). Also with positive LE swelling bilateral (1+). MS 4/5  bilaterally due to poor effort (R > L)  Data Reviewed: Basic Metabolic Panel:  Recent Labs Lab 01/02/16 1759  NA 137  K 4.4  CL 105  CO2 27  GLUCOSE 223*  BUN 17  CREATININE 1.04*  CALCIUM 8.9   Liver Function Tests:  Recent Labs Lab 01/02/16 1759  AST 24  ALT 20   ALKPHOS 95  BILITOT 0.4  PROT 7.0  ALBUMIN 3.5   CBC:  Recent Labs Lab 01/02/16 1759  WBC 5.9  NEUTROABS 3.0  HGB 11.1*  HCT 33.9*  MCV 83.3  PLT 293   Cardiac Enzymes:  Recent Labs Lab 01/02/16 1759  TROPONINI <0.03    CBG:  Recent Labs Lab 01/03/16 1135 01/03/16 1634 01/03/16 2221 01/04/16 0653 01/04/16 1142  GLUCAP 190* 185* 220* 183* 112*   Studies: Ct Head Wo Contrast  01/02/2016  CLINICAL DATA:  Acute onset of worsening unilateral right-sided weakness. Tingling and heaviness in the right arm. Initial encounter. EXAM: CT HEAD WITHOUT CONTRAST TECHNIQUE: Contiguous axial images were obtained from the base of the skull through the vertex without intravenous contrast. COMPARISON:  CT of the head performed 01/22/2010, and MRI of the brain performed 01/23/2010 FINDINGS: There is no evidence of acute infarction, mass lesion, or intra- or extra-axial hemorrhage on CT. Prominence of sulci suggests mild cortical volume loss. There is chronic infarct at the left parietal and temporal lobes, with associated encephalomalacia. Mild periventricular white matter change likely reflects small vessel ischemic microangiopathy. The brainstem and fourth ventricle are within normal limits. The basal ganglia are unremarkable in appearance. The cerebral hemispheres demonstrate grossly normal gray-white differentiation. No mass effect or midline shift is seen. There is no evidence of fracture; visualized osseous structures are unremarkable in appearance. There is incomplete fusion of the posterior arch of C1. The orbits are within normal limits. The paranasal sinuses and mastoid air cells are well-aerated. No significant soft tissue abnormalities are seen. IMPRESSION: 1. No acute intracranial pathology seen on CT. 2. Mild cortical volume loss and scattered small vessel ischemic microangiopathy. 3. Chronic infarct at the left parietal and temporal lobes, with associated encephalomalacia.  Electronically Signed   By: Roanna Raider M.D.   On: 01/02/2016 18:16   Mr Brain Wo Contrast  01/02/2016  CLINICAL DATA:  Initial evaluation for acute right upper extremity missed weakness. EXAM: MRI HEAD WITHOUT CONTRAST MRA HEAD WITHOUT CONTRAST TECHNIQUE: Multiplanar, multiecho pulse sequences of the brain and surrounding structures were obtained without intravenous contrast. Angiographic images of the head were obtained using MRA technique without contrast. COMPARISON:  Prior CT from earlier the same day. FINDINGS: MRI HEAD FINDINGS Mildly advanced cerebral atrophy present. No significant chronic small vessel ischemic disease present. Encephalomalacia with associated gliosis within the left temporal occipital region, consistent with remote left MCA territory infarct. Small remote lacunar infarct within the left thalamus noted. Subtle 5 mm focus of diffusion abnormality within the left precentral gyrus suspicious for a small acute cortical infarction (series 4, image 42). This is also seen on coronal DWI sequence (series 5, image 14). No associated hemorrhage or mass effect. No other acute infarct identified. Gray-white matter differentiation maintained. Major intracranial vascular flow voids are preserved. No mass lesion, midline shift, or mass effect. No hydrocephalus. No extra-axial fluid collection. Major dural sinuses are grossly patent. Craniocervical junction normal. Visualized upper cervical spine unremarkable. Pituitary gland within normal limits. No acute abnormality about the orbits. Mild axial myopia noted. Mild mucosal thickening within the inferior maxillary sinuses. Paranasal  sinuses are otherwise clear. No mastoid effusion. Inner ear structures normal. Bone marrow signal intensity within normal limits. No scalp soft tissue abnormality. MRA HEAD FINDINGS ANTERIOR CIRCULATION: Distal cervical segments of the internal carotid arteries are patent with antegrade flow. Petrous segments patent  bilaterally. Scattered atheromatous irregularity within the cavernous ICAs bilaterally. Mild to moderate diffuse narrowing of the cavernous right ICA. No significant narrowing on the left. A1 segments patent. Right A1 segment is hypoplastic. Anterior communicating artery normal. Anterior cerebral arteries well opacified to their distal aspects. M1 segments patent without stenosis or occlusion. MCA bifurcations normal. No proximal M2 branch occlusion or stenosis. Distal MCA branches well opacified and symmetric. Mild distal small vessel atheromatous irregularity. POSTERIOR CIRCULATION: Vertebral arteries code dominant and patent to the vertebrobasilar junction basilar artery widely patent. Superior cerebral arteries patent bilaterally. Both of the posterior cerebral arteries arise the basilar artery no well opacified to their distal aspects. Mild distal small vessel atheromatous irregularity within the PCA branches bilaterally. No aneurysm or vascular malformation. IMPRESSION: MRI HEAD IMPRESSION: 1. 5 mm subtle focus of diffusion abnormality within the left precentral gyrus. Given the patient's symptoms, finding is suspicious for a possible small acute nonhemorrhagic cortical infarct. 2. No other acute intracranial process. 3. Encephalomalacia within the left temporal occipital region, consistent with remote left MCA territory infarct. Additional remote lacunar left thalamic infarct. 4. Mild age-related cerebral atrophy. MRA HEAD IMPRESSION: 1. No large vessel occlusion within intracranial circulation. No high-grade or correctable stenosis. 2. Atheromatous irregularity within the cavernous ICAs bilaterally with mild to moderate diffuse narrowing on the right. Electronically Signed   By: Rise Mu M.D.   On: 01/02/2016 22:50   Mr Maxine Glenn Head/brain Wo Cm  01/02/2016  CLINICAL DATA:  Initial evaluation for acute right upper extremity missed weakness. EXAM: MRI HEAD WITHOUT CONTRAST MRA HEAD WITHOUT CONTRAST  TECHNIQUE: Multiplanar, multiecho pulse sequences of the brain and surrounding structures were obtained without intravenous contrast. Angiographic images of the head were obtained using MRA technique without contrast. COMPARISON:  Prior CT from earlier the same day. FINDINGS: MRI HEAD FINDINGS Mildly advanced cerebral atrophy present. No significant chronic small vessel ischemic disease present. Encephalomalacia with associated gliosis within the left temporal occipital region, consistent with remote left MCA territory infarct. Small remote lacunar infarct within the left thalamus noted. Subtle 5 mm focus of diffusion abnormality within the left precentral gyrus suspicious for a small acute cortical infarction (series 4, image 42). This is also seen on coronal DWI sequence (series 5, image 14). No associated hemorrhage or mass effect. No other acute infarct identified. Gray-white matter differentiation maintained. Major intracranial vascular flow voids are preserved. No mass lesion, midline shift, or mass effect. No hydrocephalus. No extra-axial fluid collection. Major dural sinuses are grossly patent. Craniocervical junction normal. Visualized upper cervical spine unremarkable. Pituitary gland within normal limits. No acute abnormality about the orbits. Mild axial myopia noted. Mild mucosal thickening within the inferior maxillary sinuses. Paranasal sinuses are otherwise clear. No mastoid effusion. Inner ear structures normal. Bone marrow signal intensity within normal limits. No scalp soft tissue abnormality. MRA HEAD FINDINGS ANTERIOR CIRCULATION: Distal cervical segments of the internal carotid arteries are patent with antegrade flow. Petrous segments patent bilaterally. Scattered atheromatous irregularity within the cavernous ICAs bilaterally. Mild to moderate diffuse narrowing of the cavernous right ICA. No significant narrowing on the left. A1 segments patent. Right A1 segment is hypoplastic. Anterior  communicating artery normal. Anterior cerebral arteries well opacified to their distal aspects. M1 segments  patent without stenosis or occlusion. MCA bifurcations normal. No proximal M2 branch occlusion or stenosis. Distal MCA branches well opacified and symmetric. Mild distal small vessel atheromatous irregularity. POSTERIOR CIRCULATION: Vertebral arteries code dominant and patent to the vertebrobasilar junction basilar artery widely patent. Superior cerebral arteries patent bilaterally. Both of the posterior cerebral arteries arise the basilar artery no well opacified to their distal aspects. Mild distal small vessel atheromatous irregularity within the PCA branches bilaterally. No aneurysm or vascular malformation. IMPRESSION: MRI HEAD IMPRESSION: 1. 5 mm subtle focus of diffusion abnormality within the left precentral gyrus. Given the patient's symptoms, finding is suspicious for a possible small acute nonhemorrhagic cortical infarct. 2. No other acute intracranial process. 3. Encephalomalacia within the left temporal occipital region, consistent with remote left MCA territory infarct. Additional remote lacunar left thalamic infarct. 4. Mild age-related cerebral atrophy. MRA HEAD IMPRESSION: 1. No large vessel occlusion within intracranial circulation. No high-grade or correctable stenosis. 2. Atheromatous irregularity within the cavernous ICAs bilaterally with mild to moderate diffuse narrowing on the right. Electronically Signed   By: Rise MuBenjamin  McClintock M.D.   On: 01/02/2016 22:50    Scheduled Meds: . aspirin EC  325 mg Oral Daily  . atorvastatin  40 mg Oral q1800  . B-complex with vitamin C  1 tablet Oral Daily  . buPROPion  150 mg Oral QODAY  . enoxaparin (LOVENOX) injection  40 mg Subcutaneous QHS  . ezetimibe  10 mg Oral Daily  . hydrochlorothiazide  25 mg Oral Daily  . insulin aspart  0-9 Units Subcutaneous TID WC  . insulin detemir  22 Units Subcutaneous QHS  . multivitamin with minerals   1 tablet Oral Daily  . omega-3 acid ethyl esters  1 g Oral QHS  . ramipril  10 mg Oral Daily   Continuous Infusions: . sodium chloride 75 mL/hr at 01/02/16 2335    Principal Problem:   Stroke [facility](HCC) Active Problems:   Anemia, unspecified   Dyslipidemia   Hypertension   Anemia   Depression with anxiety   Right arm weakness   Acute CVA (cerebrovascular accident) (HCC)    Time spent: 30 minutes    Vassie LollMadera, Jeaneen Cala  [facility] Pager [phone]. If 7PM-7AM, please contact night-coverage at [email or web address], password [email or web address] 01/04/2016, 3:21 PM  LOS: 1 day

## 2016-01-05 ENCOUNTER — Inpatient Hospital Stay (HOSPITAL_COMMUNITY): Payer: BC Managed Care – PPO

## 2016-01-05 ENCOUNTER — Encounter (HOSPITAL_COMMUNITY): Payer: Self-pay

## 2016-01-05 ENCOUNTER — Encounter (HOSPITAL_COMMUNITY)
Admission: EM | Disposition: A | Discharge status: Home / Self Care | Payer: Self-pay | Source: Home / Self Care | Attending: Internal Medicine

## 2016-01-05 DIAGNOSIS — I639 Cerebral infarction, unspecified: Principal | ICD-10-CM

## 2016-01-05 DIAGNOSIS — Z794 Long term (current) use of insulin: Secondary | ICD-10-CM

## 2016-01-05 DIAGNOSIS — E1159 Type 2 diabetes mellitus with other circulatory complications: Secondary | ICD-10-CM

## 2016-01-05 DIAGNOSIS — E1165 Type 2 diabetes mellitus with hyperglycemia: Secondary | ICD-10-CM | POA: Insufficient documentation

## 2016-01-05 DIAGNOSIS — I10 Essential (primary) hypertension: Secondary | ICD-10-CM

## 2016-01-05 DIAGNOSIS — E785 Hyperlipidemia, unspecified: Secondary | ICD-10-CM

## 2016-01-05 DIAGNOSIS — I359 Nonrheumatic aortic valve disorder, unspecified: Secondary | ICD-10-CM

## 2016-01-05 HISTORY — PX: EP IMPLANTABLE DEVICE: SHX172B

## 2016-01-05 HISTORY — PX: TEE WITHOUT CARDIOVERSION: SHX5443

## 2016-01-05 LAB — VAS US CAROTID
LEFT ECA DIAS: -9 cm/s
LEFT VERTEBRAL DIAS: 11 cm/s
Left CCA dist dias: 15 cm/s
Left CCA dist sys: 80 cm/s
Left CCA prox dias: 19 cm/s
Left CCA prox sys: 123 cm/s
Left ICA dist dias: -13 cm/s
Left ICA dist sys: -47 cm/s
Left ICA prox dias: -19 cm/s
Left ICA prox sys: -71 cm/s
RIGHT ECA DIAS: -12 cm/s
RIGHT VERTEBRAL DIAS: -11 cm/s
Right CCA prox dias: 21 cm/s
Right CCA prox sys: 91 cm/s
Right cca dist sys: -72 cm/s

## 2016-01-05 LAB — GLUCOSE, CAPILLARY
Glucose-Capillary: 210 mg/dL — ABNORMAL HIGH (ref 65–99)
Glucose-Capillary: 230 mg/dL — ABNORMAL HIGH (ref 65–99)
Glucose-Capillary: 82 mg/dL (ref 65–99)
Glucose-Capillary: 288 mg/dL — ABNORMAL HIGH (ref 65–99)

## 2016-01-05 LAB — HEMOGLOBIN A1C
Hgb A1c MFr Bld: 9.9 % — ABNORMAL HIGH (ref 4.8–5.6)
Mean Plasma Glucose: 237 mg/dL

## 2016-01-05 SURGERY — ECHOCARDIOGRAM, TRANSESOPHAGEAL
Anesthesia: Moderate Sedation

## 2016-01-05 SURGERY — LOOP RECORDER INSERTION

## 2016-01-05 MED ORDER — FENTANYL CITRATE (PF) 100 MCG/2ML IJ SOLN
INTRAMUSCULAR | Status: DC | PRN
  Administered 2016-01-05: 25 ug via INTRAVENOUS
  Administered 2016-01-05: 12.5 ug via INTRAVENOUS

## 2016-01-05 MED ORDER — MIDAZOLAM HCL 10 MG/2ML IJ SOLN
INTRAMUSCULAR | Status: DC | PRN
  Administered 2016-01-05: 1 mg via INTRAVENOUS
  Administered 2016-01-05: 2 mg via INTRAVENOUS
  Administered 2016-01-05: 1 mg via INTRAVENOUS

## 2016-01-05 MED ORDER — LIDOCAINE-EPINEPHRINE 1 %-1:100000 IJ SOLN
INTRAMUSCULAR | Status: AC
  Filled 2016-01-05: qty 1

## 2016-01-05 MED ORDER — BUPIVACAINE HCL (PF) 0.25 % IJ SOLN
INTRAMUSCULAR | Status: AC
  Filled 2016-01-05: qty 30

## 2016-01-05 MED ORDER — MIDAZOLAM HCL 5 MG/ML IJ SOLN
INTRAMUSCULAR | Status: AC
  Filled 2016-01-05: qty 2

## 2016-01-05 MED ORDER — BUPIVACAINE HCL (PF) 0.25 % IJ SOLN
INTRAMUSCULAR | Status: DC | PRN
  Administered 2016-01-05: 10 mL

## 2016-01-05 MED ORDER — CLOPIDOGREL BISULFATE 75 MG PO TABS
75.0000 mg | ORAL_TABLET | Freq: Every day | ORAL | Status: DC
  Administered 2016-01-05 – 2016-01-06 (×2): 75 mg via ORAL
  Filled 2016-01-05 (×2): qty 1

## 2016-01-05 MED ORDER — FENTANYL CITRATE (PF) 100 MCG/2ML IJ SOLN
INTRAMUSCULAR | Status: AC
  Filled 2016-01-05: qty 2

## 2016-01-05 SURGICAL SUPPLY — 2 items
LOOP REVEAL LINQSYS (Prosthesis & Implant Heart) ×1 IMPLANT
PACK LOOP INSERTION (CUSTOM PROCEDURE TRAY) ×2 IMPLANT

## 2016-01-05 NOTE — Progress Notes (Signed)
[facility] PROGRESS NOTE  Catherine Bautista ZOX:[id number] DOB: 1951-04-16 DOA: 01/02/2016 PCP: No PCP Per Patient  Interim summary and HPI 65 y.o. female with medical history significant of stroke, anemia, obesity, fibromyalgia, hypertension, hyperlipidemia, diabetes mellitus, depression, anxiety, who presents with right arm weakness and numbness.  Patient reports that she started having right arm numbness and weakness at about 5 PM. It has improved since then, but still has some mild weakness and numbness. She also has tingling sensations in her right toes. No leg weakness. Patient does not have slurry speech, vision change, hearing loss. She feels nauseated, but no vomiting, abdominal pain or diarrhea. Patient does not have fever, chills, chest pain, shortness of breath, cough, symptoms of UTI.  Feeling better and complaining just of mild numbness/weakness on right side. Plan is to discharge Home with outpatient PT at discharge; will complete work up and follow rec's from neurology. Anticipate discharge on 01/06/16   Assessment/Plan: Stroke [facility](HCC): pt has history of stroke twice, last one was 2011. Now presents with right arm weakness and numbness. She also has tingling sensations in right toes, concerning for new stroke. CT head is negative for acute intracranial abnormalities. Left cortical infarct seen on MRI. -per neurology plan is for plavix and aspirin (full dose) for 3 months; then a follow up will decide long term secondary prevention (patient is a candidate for RESPECT ESUS trial)  -will follow neurology rec's; planning for loop recorder later today. TEE with some abnormalities, but not consider to be source of emboli. -continue risk factors modifications  HLD:  -continue Lipitor (adjusted for better control) and Zetia -LDL 96, goal is <70  HTN: -continue ramipril and HCTZ -IV hydralazine when necessary  Depression and anxiety: Stable, no suicidal or homicidal ideations. -Continue  home medications: Amitriptyline, Wellbutrin and Valium  Anemia: Hemoglobin stable, 11.1 on admission -Follow-up with CBC -no signs of overt bleeding   DM-II: Last A1c 8.7 on 01/23/10 poorly controled at that time -Patient is taking Levemir, Janumet at home -will continue levemir (currently at 22 units for better control while inpatient); will also continue holding oral agents while inpatient and continue SSI -A1c 9.9  Code Status: Full Family Communication: son and husband at bedside  Disposition Plan: home with outpatient PT at discharge; will complete work up and follow rec's from neurology.   Consultants:  Neurology   Cardiology consulted for TEE  Procedures:  See below for x-ray reports  2-D echo:  - Left ventricle: The cavity size was normal. Wall thickness was  normal. Systolic function was normal. The estimated ejection  fraction was in the range of 60% to 65%. Wall motion was normal;  there were no regional wall motion abnormalities. Doppler  parameters are consistent with abnormal left ventricular  relaxation (grade 1 diastolic dysfunction).  Carotid duple: 1-39% ICA stenosis. Vertebral artery flow is antegrade.   Lower extremity Dopplers: Negative for DVT, SVT or Baker's cyst bilaterally  TEE:  There appears to me redundant chordae tendinae with mild MR. There is a very thin mobile density in the vicinity of the anterior mitral valve that could be a small ruptured chordae tendinae but cannot rule out a small vegetation. Endocarditis is a clinical diagnosis made by positive blood cultures and physical exam findings.Normal trileaflet AV with mild aortic valve sclerosis but no stenosis and mild AI Markedly positive agitated saline contrast study within 2 cardiac cycles consistent with patent foramen ovale.  Normal thoracic and ascending aorta  Loop recorder implantation: pending  Antibiotics:  None   HPI/Subjective: Afebrile, no CP, no SOB. No  dysarthria appreciated. Patient with mild right lower extremity numbness and a slight weakness on her right grip, otherwise w/o acute complaints or other focal deficit.  Objective: Filed Vitals:   01/05/16 0955 01/05/16 1019  BP: 130/65 116/53  Pulse: 56 54  Temp:  97.7 F (36.5 C)  Resp: 13 15   No intake or output data in the 24 hours ending 01/05/16 1706 Filed Weights   01/02/16 1747 01/02/16 2230  Weight: 122.018 kg (269 lb) 120.294 kg (265 lb 3.2 oz)    Exam:   General:  Afebrile, no dysarthria, oriented X3 and w/o focal neurologic complaints. Able to follow commands properly and with good insight about situation and needs for procedures.   Cardiovascular: S1 and S2, no rubs or gallops  Respiratory: CTA bilaterally  Abdomen: soft, obese, NT, ND  Musculoskeletal: no cyanosis or clubbing; patient with some left LE joint pain (around her knee). Also with positive LE swelling bilateral (1+). MS 4/5 bilaterally due to poor effort (R > L)  Data Reviewed: Basic Metabolic Panel:  Recent Labs Lab 01/02/16 1759  NA 137  K 4.4  CL 105  CO2 27  GLUCOSE 223*  BUN 17  CREATININE 1.04*  CALCIUM 8.9   Liver Function Tests:  Recent Labs Lab 01/02/16 1759  AST 24  ALT 20  ALKPHOS 95  BILITOT 0.4  PROT 7.0  ALBUMIN 3.5   CBC:  Recent Labs Lab 01/02/16 1759  WBC 5.9  NEUTROABS 3.0  HGB 11.1*  HCT 33.9*  MCV 83.3  PLT 293   Cardiac Enzymes:  Recent Labs Lab 01/02/16 1759  TROPONINI <0.03    CBG:  Recent Labs Lab 01/04/16 1632 01/04/16 2156 01/05/16 0656 01/05/16 1134 01/05/16 1603  GLUCAP 154* 223* 210* 230* 82   Studies: No results found.  Scheduled Meds: . aspirin EC  325 mg Oral Daily  . atorvastatin  40 mg Oral q1800  . B-complex with vitamin C  1 tablet Oral Daily  . buPROPion  150 mg Oral QODAY  . clopidogrel  75 mg Oral Daily  . enoxaparin (LOVENOX) injection  40 mg Subcutaneous QHS  . ezetimibe  10 mg Oral Daily  .  hydrochlorothiazide  25 mg Oral Daily  . insulin aspart  0-9 Units Subcutaneous TID WC  . insulin detemir  22 Units Subcutaneous QHS  . multivitamin with minerals  1 tablet Oral Daily  . omega-3 acid ethyl esters  1 g Oral QHS  . ramipril  10 mg Oral Daily   Continuous Infusions: . sodium chloride 75 mL/hr at 01/02/16 2335    Principal Problem:   Stroke [facility]) Active Problems:   Anemia, unspecified   Dyslipidemia   Hypertension   Anemia   Depression with anxiety   Right arm weakness   Acute CVA (cerebrovascular accident) [facility])   Essential hypertension   Stroke (cerebrum) (HCC)    Time spent: 30 minutes    Vassie Loll  [facility] Pager [phone]. If 7PM-7AM, please contact night-coverage at [email or web address], password [facility] 01/05/2016, 5:06 PM  LOS: 2 days

## 2016-01-05 NOTE — Progress Notes (Signed)
STROKE TEAM PROGRESS NOTE   SUBJECTIVE (INTERVAL HISTORY) Pt husband at bedside. Just came back from TEE. No complains. Waiting to see if EP cardiology has thoughts about loop or not based on TEE reports.    OBJECTIVE Temp:  [97.6 F (36.4 C)-98.6 F (37 C)] 97.7 F (36.5 C) (06/06 1019) Pulse Rate:  [51-90] 54 (06/06 1019) Cardiac Rhythm:  [-] Sinus bradycardia;Heart block (06/06 0955) Resp:  [9-27] 15 (06/06 1019) BP: (99-178)/(36-85) 116/53 mmHg (06/06 1019) SpO2:  [93 %-100 %] 100 % (06/06 1019)  CBC:   Recent Labs Lab 01/02/16 1759  WBC 5.9  NEUTROABS 3.0  HGB 11.1*  HCT 33.9*  MCV 83.3  PLT 293    Basic Metabolic Panel:   Recent Labs Lab 01/02/16 1759  NA 137  K 4.4  CL 105  CO2 27  GLUCOSE 223*  BUN 17  CREATININE 1.04*  CALCIUM 8.9    Lipid Panel:     Component Value Date/Time   CHOL 153 01/03/2016 0447   TRIG 69 01/03/2016 0447   HDL 43 01/03/2016 0447   CHOLHDL 3.6 01/03/2016 0447   VLDL 14 01/03/2016 0447   LDLCALC 96 01/03/2016 0447   HgbA1c:  Lab Results  Component Value Date   HGBA1C 9.9* 01/03/2016   Urine Drug Screen:     Component Value Date/Time   LABOPIA NONE DETECTED 01/02/2016 1946   COCAINSCRNUR NONE DETECTED 01/02/2016 1946   LABBENZ NONE DETECTED 01/02/2016 1946   AMPHETMU NONE DETECTED 01/02/2016 1946   THCU NONE DETECTED 01/02/2016 1946   LABBARB NONE DETECTED 01/02/2016 1946      IMAGING I have personally reviewed the radiological images below and agree with the radiology interpretations.  Ct Head Wo Contrast 01/02/2016   1. No acute intracranial pathology seen on CT.  2. Mild cortical volume loss and scattered small vessel ischemic microangiopathy.  3. Chronic infarct at the left parietal and temporal lobes, with associated encephalomalacia.   MRI HEAD  01/02/2016   1. 5 mm subtle focus of diffusion abnormality within the left precentral gyrus. Given the patient's symptoms, finding is suspicious for a possible  small acute nonhemorrhagic cortical infarct.  2. No other acute intracranial process.  3. Encephalomalacia within the left temporal occipital region, consistent with remote left MCA territory infarct. Additional remote lacunar left thalamic infarct.  4. Mild age-related cerebral atrophy.   MRA HEAD  01/02/2016   1. No large vessel occlusion within intracranial circulation. No high-grade or correctable stenosis.  2. Atheromatous irregularity within the cavernous ICAs bilaterally with mild to moderate diffuse narrowing on the right.   2-D echocardiogram - Left ventricle: The cavity size was normal. Wall thickness was normal. Systolic function was normal. The estimated ejection fraction was in the range of 60% to 65%. Wall motion was normal; there were no regional wall motion abnormalities. Doppler parameters are consistent with abnormal left ventricular relaxation (grade 1 diastolic dysfunction).  Carotid Doppler   There is 1-39% bilateral ICA stenosis. Vertebral artery flow is antegrade. '  Lower extremity venous Doppler There is no DVT or SVT noted in the bilateral lower extremities.   TEE Normal LV size and function Normal RV size and function Normal RA Mildly enlarged LA Normal TV with mild TR Normal PV There appears to me redundant chordae tendinae with mild MR. There is a very thin mobile density in the vicinity of the anterior mitral valve that could be a small ruptured chordae tendinae but cannot rule out a small  vegetation. Endocarditis is a clinical diagnosis made by positive blood cultures and physical exam findings.  Normal trileaflet AV with mild aortic valve sclerosis but no stenosis and mild AI Markedly positive agitated saline contrast study within 2 cardiac cycles consistent with patent foramen ovale.  Normal thoracic and ascending aorta.   PHYSICAL EXAM Pleasant elderly african american lady not in distress. . Afebrile. Head is nontraumatic. Neck is supple without  bruit.    Cardiac exam no murmur or gallop. Lungs are clear to auscultation. Distal pulses are well felt. Neurological Exam ;  Awake  Alert oriented x 3. Normal speech and language.eye movements full without nystagmus.fundi were not visualized. Vision acuity and fields appear normal. Hearing is normal. Palatal movements are normal. Face symmetric. Tongue midline. Normal strength, tone, reflexes and coordination. Normal sensation. Gait deferred.   ASSESSMENT/PLAN Catherine Bautista is a 65 y.o. female with history of diabetes mellitus, hypertension, hyperlipidemia, previous strokes, obesity, and fibromyalgia presenting with right upper extremity numbness and weakness.  She did not receive IV t-PA due to minimal deficits.  Stroke:  punctate left frontal cortical infarct - possibly embolic, source unknown  Resultant resolved  MRI 5 mm subtle focus of diffusion abnormality within the left precentral gyrus.   MRA  mild to moderate narrowing of the right internal carotid artery.  Carotid Doppler - no significant stenosis  2D Echo EF 60-65%. No source of embolus   Lower extremity venous Doppler negative TEE ? Redundant chordae tendinae, thin mobile density anterior MV that may be a small ruptured chordae tendinae but cannot r/o small vegetation. EP cardiology does not think TEE report indicates SOE, will continue with loop placement.  LDL - 96  HgbA1c 9.9  VTE prophylaxis - Lovenox Diet heart healthy/carb modified Room service appropriate?: Yes; Fluid consistency:: Thin  clopidogrel 75 mg daily prior to admission, now on ASA  daily. Due to failure of plavix alone for her stroke, would recommend dual antiplatelet for 3 months and then plavix alone.   A candidate for RESPECT ESUS trial.   Patient counseled to be compliant with her antithrombotic medications  Ongoing aggressive stroke risk factor management  Therapy recommendations:  Outpatient physical therapy, no OT  Disposition:   Return home with OP PT  History of stroke  2002, left MCA infarct  12/2009, right frontal cortical infarct  No residue  Hypertension  Stable  Permissive hypertension (OK if < 220/120) but gradually normalize in 5-7 days  Hyperlipidemia  Home meds:  Lipitor 20 mg daily resumed in hospital  LDL 96, goal < 70  Increased Lipitor to 40 mg daily  Continue statin at discharge  Diabetes type II  HgbA1c 9.9, goal < 7.0  Uncontrolled  Hyperglycemia  SSI   On levemir  Need close follow up with PCP  Other Stroke Risk Factors  Advanced age  Obesity, Body mass index is 42.82 kg/(m^2).   Hx stroke/TIA  Other Active Problems  Mild anemia  Mildly elevated creatinine - 1.04  Depression and anxiety  Hospital day # 1  Neurology will sign off. Please call with questions. Pt will follow up with Dr. Roda Shutters at [facility] in about 2 months for RESPECT ESUS trial. Thanks for the consult.   Marvel Plan, MD PhD Stroke Neurology 01/05/2016 1:09 PM      To contact Stroke Continuity provider, please refer to [email or web address]. After hours, contact General Neurology

## 2016-01-05 NOTE — Progress Notes (Signed)
  Echocardiogram Echocardiogram Transesophageal has been performed.  Catherine SavoyCasey N Tenesia Bautista 01/05/2016, 10:02 AM

## 2016-01-05 NOTE — CV Procedure (Signed)
    PROCEDURE NOTE:  Procedure:  Transesophageal echocardiogram Operator:  Armanda Magicraci Jazmaine Fuelling, MD Indications:  CVA Complications: None  During this procedure the patient is administered a total of Versed 4 mg and Fentanyl 37.5 mg to achieve and maintain moderate conscious sedation.  The patient's heart rate, blood pressure, and oxygen saturation are monitored continuously during the procedure. The period of conscious sedation is 17 minutes, of which I was present face-to-face 100% of this time.  Results: Normal LV size and function Normal RV size and function Normal RA Mildly enlarged LA Normal TV with mild TR Normal PV There appears to me redundant chordae tendinae with mild MR.  There is a very thin mobile density in the vicinity of the anterior mitral valve that could be a small ruptured chordae tendinae but cannot rule out a small vegetation.  Endocarditis is a clinical diagnosis made by positive blood cultures and physical exam findings.   Normal trileaflet AV with mild aortic valve sclerosis but no stenosis and mild AI Markedly positive agitated saline contrast study within 2 cardiac cycles consistent with patent foramen ovale.  Normal thoracic and ascending aorta.  The patient tolerated the procedure well and was transferred back to their room in stable condition.  Signed: Armanda Magicraci Walt Geathers, MD [facility]

## 2016-01-05 NOTE — Interval H&P Note (Signed)
History and Physical Interval Note:  01/05/2016 9:15 AM  Catherine Bautista  has presented today for surgery, with the diagnosis of STROKE  The various methods of treatment have been discussed with the patient and family. After consideration of risks, benefits and other options for treatment, the patient has consented to  Procedure(s): TRANSESOPHAGEAL ECHOCARDIOGRAM (TEE) (N/A) as a surgical intervention .  The patient's history has been reviewed, patient examined, no change in status, stable for surgery.  I have reviewed the patient's chart and labs.  Questions were answered to the patient's satisfaction.     Armanda Magicraci [name]

## 2016-01-05 NOTE — Consult Note (Signed)
ELECTROPHYSIOLOGY CONSULT NOTE  Patient ID: Catherine Bautista MRN: [id number], DOB/AGE: March 05, 1951   Admit date: 01/02/2016 Date of Consult: 01/05/2016  Primary Physician: No PCP Per Patient Primary Cardiologist: none Reason for Consultation: Cryptogenic stroke ; recommendations regarding Implantable Loop Recorder  History of Present Illness [name] was admitted on 01/02/2016 with RUE weakness.  PMHx includes HTN, HLD, prior strokes, DM, fibromyalgia.  They first developed symptoms while at home.  Imaging demonstrated punctate left frontal cortical infarct - possibly embolic, source unknown.  she has undergone workup for stroke including echocardiogram and carotid dopplers.  The patient has been monitored on telemetry which has demonstrated sinus rhythm with no arrhythmias.  Inpatient stroke work-up is to be completed with a TEE, with findings below, and EP have been asked to evaluate for possible loop implant.   Echocardiogram this admission demonstrated  Study Conclusions - Left ventricle: The cavity size was normal. Wall thickness was  normal. Systolic function was normal. The estimated ejection  fraction was in the range of 60% to 65%. Wall motion was normal;  there were no regional wall motion abnormalities. Doppler  parameters are consistent with abnormal left ventricular  relaxation (grade 1 diastolic dysfunction).  01/04/16: LE venous US/doppler Summary: No evidence of deep vein or superficial thrombosis involving the right lower extremity and left lower extremity.  01/05/16: TEE Results: Normal LV size and function Normal RV size and function Normal RA Mildly enlarged LA Normal TV with mild TR Normal PV There appears to me redundant chordae tendinae with mild MR. There is a very thin mobile density in the vicinity of the anterior mitral valve that could be a small ruptured chordae tendinae but cannot rule out a small vegetation. Endocarditis is a clinical diagnosis made by  positive blood cultures and physical exam findings.  Normal trileaflet AV with mild aortic valve sclerosis but no stenosis and mild AI Markedly positive agitated saline contrast study within 2 cardiac cycles consistent with patent foramen ovale.  Normal thoracic and ascending aorta.  Lab work is reviewed.  Prior to admission, the patient denies chest pain, shortness of breath, dizziness, palpitations, or syncope.  They are recovering from their stroke with plans to home at discharge.  EP has been asked to evaluate for placement of an implantable loop recorder to monitor for atrial fibrillation.     Past Medical History  Diagnosis Date  . Anemia   . Anemia, vitamin B12 deficiency   . Obesity   . Fibromyalgia   . Dyslipidemia   . Hypertension   . Diabetes mellitus   . Stroke (HCC)   . Depression with anxiety      Surgical History:  Past Surgical History  Procedure Laterality Date  . Gastric bypass  1981  . Cholecystectomy      laproscopic  . Abdominal hysterectomy  2007  . Therapuetic abortion  1986    associated with C-section     Prescriptions prior to admission  Medication Sig Dispense Refill Last Dose  . amitriptyline (ELAVIL) 10 MG tablet Take 5 mg by mouth at bedtime as needed for sleep.    2 weeks  . atorvastatin (LIPITOR) 20 MG tablet Take 20 mg by mouth daily at 6 PM.    01/01/2016 at Unknown time  . b complex vitamins tablet Take 1 tablet by mouth daily.     Past Week at Unknown time  . buPROPion (WELLBUTRIN XL) 150 MG 24 hr tablet Take 150 mg by mouth every  other day.  3 01/01/2016 at Unknown time  . clopidogrel (PLAVIX) 75 MG tablet Take 75 mg by mouth daily.     01/02/2016 at Unknown time  . glimepiride (AMARYL) 1 MG tablet Take 1 mg by mouth daily with breakfast.   5 01/02/2016 at Unknown time  . hydrochlorothiazide (HYDRODIURIL) 25 MG tablet Take 25 mg by mouth daily.     01/02/2016 at Unknown time  . Insulin Detemir (LEVEMIR FLEXPEN Stover) Inject 28 Units into the  skin at bedtime.    01/01/2016 at Unknown time  . JANUVIA 100 MG tablet Take 100 mg by mouth daily.  2 01/02/2016 at Unknown time  . multivitamin (THERAGRAN) per tablet Take 1 tablet by mouth daily.     01/02/2016 at Unknown time  . OMEGA 3 1200 MG CAPS Take 1,200 mg by mouth at bedtime.    01/01/2016 at Unknown time  . phenyltoloxamine-acetaminophen 50-500 MG tablet Take 1 tablet by mouth at bedtime as needed for pain.   12/31/2015  . ramipril (ALTACE) 10 MG capsule Take 10 mg by mouth daily.     01/02/2016 at Unknown time  . Bromocriptine Mesylate (CYCLOSET PO) Take 0.48 mg by mouth daily.       . cyclobenzaprine (FLEXERIL) 10 MG tablet Take 1 tablet (10 mg total) by mouth 3 (three) times daily. 20 tablet 0   . diazepam (VALIUM) 5 MG tablet Take 1 tablet (5 mg total) by mouth every 12 (twelve) hours as needed for muscle spasms. 10 tablet 0   . ezetimibe (ZETIA) 10 MG tablet Take 10 mg by mouth daily.       Marland Kitchen HYDROcodone-acetaminophen (NORCO/VICODIN) 5-325 MG per tablet Take 1-2 tablets by mouth every 4 (four) hours as needed. 10 tablet 0   . insulin glargine (LANTUS) 100 UNIT/ML injection Inject 22 Units into the skin at bedtime.      . naproxen (NAPROSYN) 500 MG tablet Take 1 tablet (500 mg total) by mouth 2 (two) times daily. 15 tablet 0   . simvastatin (ZOCOR) 20 MG tablet Take 20 mg by mouth daily.     Marland Kitchen SITagliptin-MetFORMIN HCl (JANUMET PO) Take 500 mg by mouth 2 (two) times daily.       Inpatient Medications:  . aspirin EC  325 mg Oral Daily  . atorvastatin  40 mg Oral q1800  . B-complex with vitamin C  1 tablet Oral Daily  . buPROPion  150 mg Oral QODAY  . enoxaparin (LOVENOX) injection  40 mg Subcutaneous QHS  . ezetimibe  10 mg Oral Daily  . hydrochlorothiazide  25 mg Oral Daily  . insulin aspart  0-9 Units Subcutaneous TID WC  . insulin detemir  22 Units Subcutaneous QHS  . multivitamin with minerals  1 tablet Oral Daily  . omega-3 acid ethyl esters  1 g Oral QHS  . ramipril  10 mg Oral  Daily    Allergies:  Allergies  Allergen Reactions  . Demerol Nausea And Vomiting  . Excedrin Extra Strength [Aspirin-Acetaminophen-Caffeine] Other (See Comments)    Weird feeling  . Invokana [Canagliflozin] Other (See Comments)    Decreasing renal function and extremely lethargic  . Janumet [Sitagliptin-Metformin Hcl] Other (See Comments)    Bloating and gas  . Morphine And Related Other (See Comments)    Weird feeling  . Novocain [Procaine Hcl] Nausea And Vomiting    Social History   Social History  . Marital Status: Married    Spouse Name: N/A  . Number  of Children: N/A  . Years of Education: N/A   Occupational History  . Not on file.   Social History Main Topics  . Smoking status: Never Smoker   . Smokeless tobacco: Never Used  . Alcohol Use: No  . Drug Use: No  . Sexual Activity: Not on file   Other Topics Concern  . Not on file   Social History Narrative     Family History  Problem Relation Age of Onset  . Stroke Mother   . Heart failure Mother   . Lung cancer Father   . Kidney cancer Brother       Review of Systems: All other systems reviewed and are otherwise negative except as noted above.  Physical Exam: Filed Vitals:   01/05/16 0945 01/05/16 0950 01/05/16 0955 01/05/16 1019  BP: 118/57 113/64 130/65 116/53  Pulse: 61 57 56 54  Temp:    97.7 F (36.5 C)  TempSrc:    Oral  Resp: 14 13 13 15   Height:      Weight:      SpO2: 100% 100% 100% 100%    GEN- The patient is well appearing, alert and oriented x 3 today.   Head- normocephalic, atraumatic Eyes-  Sclera clear, conjunctiva pink Ears- hearing intact Oropharynx- clear Neck- supple Lungs- Clear to ausculation bilaterally, normal work of breathing Heart- Regular rate and rhythm, no murmurs, rubs or gallops  GI- soft, NT, ND Extremities- no clubbing, cyanosis, or edema MS- no significant deformity or atrophy Skin- no rash or lesion Psych- euthymic mood, full affect   Labs:     Lab Results  Component Value Date   WBC 5.9 01/02/2016   HGB 11.1* 01/02/2016   HCT 33.9* 01/02/2016   MCV 83.3 01/02/2016   PLT 293 01/02/2016    Recent Labs Lab 01/02/16 1759  NA 137  K 4.4  CL 105  CO2 27  BUN 17  CREATININE 1.04*  CALCIUM 8.9  PROT 7.0  BILITOT 0.4  ALKPHOS 95  ALT 20  AST 24  GLUCOSE 223*   Lab Results  Component Value Date   CKTOTAL 47 01/23/2010   CKMB 0.6 01/23/2010   TROPONINI <0.03 01/02/2016   Lab Results  Component Value Date   CHOL 153 01/03/2016   CHOL * 01/23/2010    241        ATP III CLASSIFICATION:  <200     mg/dL   Desirable  782-956  mg/dL   Borderline High  >=213    mg/dL   High          Lab Results  Component Value Date   HDL 43 01/03/2016   HDL 49 01/23/2010   Lab Results  Component Value Date   LDLCALC 96 01/03/2016   LDLCALC * 01/23/2010    181        Total Cholesterol/HDL:CHD Risk Coronary Heart Disease Risk Table                     Men   Women  1/2 Average Risk   3.4   3.3  Average Risk       5.0   4.4  2 X Average Risk   9.6   7.1  3 X Average Risk  23.4   11.0        Use the calculated Patient Ratio above and the CHD Risk Table to determine the patient's CHD Risk.        ATP III CLASSIFICATION (  LDL):  <100     mg/dL   Optimal  161-096100-129  mg/dL   Near or Above                    Optimal  130-159  mg/dL   Borderline  [id number]  mg/dL   High  >811>190     mg/dL   Very High   Lab Results  Component Value Date   TRIG 69 01/03/2016   TRIG 57 01/23/2010   Lab Results  Component Value Date   CHOLHDL 3.6 01/03/2016   CHOLHDL 4.9 01/23/2010   No results found for: LDLDIRECT  No results found for: DDIMER   Radiology/Studies:  Ct Head Wo Contrast 01/02/2016  CLINICAL DATA:  Acute onset of worsening unilateral right-sided weakness. Tingling and heaviness in the right arm. Initial encounter. EXAM: CT HEAD WITHOUT CONTRAST TECHNIQUE: Contiguous axial images were obtained from the base of the skull through  the vertex without intravenous contrast. COMPARISON:  CT of the head performed 01/22/2010, and MRI of the brain performed 01/23/2010 FINDINGS: There is no evidence of acute infarction, mass lesion, or intra- or extra-axial hemorrhage on CT. Prominence of sulci suggests mild cortical volume loss. There is chronic infarct at the left parietal and temporal lobes, with associated encephalomalacia. Mild periventricular white matter change likely reflects small vessel ischemic microangiopathy. The brainstem and fourth ventricle are within normal limits. The basal ganglia are unremarkable in appearance. The cerebral hemispheres demonstrate grossly normal gray-white differentiation. No mass effect or midline shift is seen. There is no evidence of fracture; visualized osseous structures are unremarkable in appearance. There is incomplete fusion of the posterior arch of C1. The orbits are within normal limits. The paranasal sinuses and mastoid air cells are well-aerated. No significant soft tissue abnormalities are seen. IMPRESSION: 1. No acute intracranial pathology seen on CT. 2. Mild cortical volume loss and scattered small vessel ischemic microangiopathy. 3. Chronic infarct at the left parietal and temporal lobes, with associated encephalomalacia. Electronically Signed   By: Roanna RaiderJeffery  Chang M.D.   On: 01/02/2016 18:16   Mr Brain Wo Contrast 01/02/2016  CLINICAL DATA:  Initial evaluation for acute right upper extremity missed weakness. EXAM: MRI HEAD WITHOUT CONTRAST MRA HEAD WITHOUT CONTRAST TECHNIQUE: Multiplanar, multiecho pulse sequences of the brain and surrounding structures were obtained without intravenous contrast. Angiographic images of the head were obtained using MRA technique without contrast. COMPARISON:  Prior CT from earlier the same day. FINDINGS: MRI HEAD FINDINGS Mildly advanced cerebral atrophy present. No significant chronic small vessel ischemic disease present. Encephalomalacia with associated  gliosis within the left temporal occipital region, consistent with remote left MCA territory infarct. Small remote lacunar infarct within the left thalamus noted. Subtle 5 mm focus of diffusion abnormality within the left precentral gyrus suspicious for a small acute cortical infarction (series 4, image 42). This is also seen on coronal DWI sequence (series 5, image 14). No associated hemorrhage or mass effect. No other acute infarct identified. Gray-white matter differentiation maintained. Major intracranial vascular flow voids are preserved. No mass lesion, midline shift, or mass effect. No hydrocephalus. No extra-axial fluid collection. Major dural sinuses are grossly patent. Craniocervical junction normal. Visualized upper cervical spine unremarkable. Pituitary gland within normal limits. No acute abnormality about the orbits. Mild axial myopia noted. Mild mucosal thickening within the inferior maxillary sinuses. Paranasal sinuses are otherwise clear. No mastoid effusion. Inner ear structures normal. Bone marrow signal intensity within normal limits. No scalp soft tissue abnormality. MRA HEAD  FINDINGS ANTERIOR CIRCULATION: Distal cervical segments of the internal carotid arteries are patent with antegrade flow. Petrous segments patent bilaterally. Scattered atheromatous irregularity within the cavernous ICAs bilaterally. Mild to moderate diffuse narrowing of the cavernous right ICA. No significant narrowing on the left. A1 segments patent. Right A1 segment is hypoplastic. Anterior communicating artery normal. Anterior cerebral arteries well opacified to their distal aspects. M1 segments patent without stenosis or occlusion. MCA bifurcations normal. No proximal M2 branch occlusion or stenosis. Distal MCA branches well opacified and symmetric. Mild distal small vessel atheromatous irregularity. POSTERIOR CIRCULATION: Vertebral arteries code dominant and patent to the vertebrobasilar junction basilar artery widely  patent. Superior cerebral arteries patent bilaterally. Both of the posterior cerebral arteries arise the basilar artery no well opacified to their distal aspects. Mild distal small vessel atheromatous irregularity within the PCA branches bilaterally. No aneurysm or vascular malformation. IMPRESSION: MRI HEAD IMPRESSION: 1. 5 mm subtle focus of diffusion abnormality within the left precentral gyrus. Given the patient's symptoms, finding is suspicious for a possible small acute nonhemorrhagic cortical infarct. 2. No other acute intracranial process. 3. Encephalomalacia within the left temporal occipital region, consistent with remote left MCA territory infarct. Additional remote lacunar left thalamic infarct. 4. Mild age-related cerebral atrophy. MRA HEAD IMPRESSION: 1. No large vessel occlusion within intracranial circulation. No high-grade or correctable stenosis. 2. Atheromatous irregularity within the cavernous ICAs bilaterally with mild to moderate diffuse narrowing on the right. Electronically Signed   By: Rise Mu M.D.   On: 01/02/2016 22:50    12-lead ECG SR All prior EKG's in EPIC reviewed with no documented atrial fibrillation  Telemetry  SB/SR only  Assessment and Plan:  1. Cryptogenic stroke The patient presents with cryptogenic stroke.  The patient has a TEE planned for this AM.  I spoke at length with the patient about monitoring for afib with either a 30 day event monitor or an implantable loop recorder.  Risks, benefits, and alteratives to implantable loop recorder were discussed with the patient today.   At this time, the patient is very clear in their decision to proceed with implantable loop recorder.  The patient had sedation today 0930, she is very clear and able to states understanding and is able to repeat back the information to myself and Dr Elberta Fortis, states she has done some personal research into possible AFib already, her husband is at bedside as well and also states  understanding and both are agreeable for the patient to proceed.  We Thecla Forgione defer any general cariology evaluation into her PFO, mitral valve TEE findings to neurology/primary service.  She has not had kind of febrile symptoms/chills or fever at home or here, WBC count is wnl.  Wound care was reviewed with the patient (keep incision clean and dry for 3 days).  Wound check Arshia Spellman be scheduled for the patient.  Please call with questions.   Sheilah Pigeon, PA-C 01/05/2016  I have seen and examined this patient with Francis Dowse.  Agree with above, note added to reflect my findings.  On exam, regular rhythm, no murmurs, lungs clear.  Has had multiple cryptogenic strokes over the last few years.  TEE shows no source of embolism but a PFO.  Had LE ultrasound negative for DVT.  Plan for LINQ for AF monitoring.    Rilee Wendling M. Asahd Can MD 01/05/2016 3:07 PM

## 2016-01-05 NOTE — Progress Notes (Signed)
Inpatient Diabetes Program Recommendations  AACE/ADA: New Consensus Statement on Inpatient Glycemic Control (2015)  Target Ranges:  Prepandial:   less than 140 mg/dL      Peak postprandial:   less than 180 mg/dL (1-2 hours)      Critically ill patients:  140 - 180 mg/dL   Results for Guttierrez, Ellwood DenseLOU S (MRN [id number]) as of 01/05/2016 08:17  Ref. Range 01/03/2016 22:21 01/04/2016 06:53 01/04/2016 11:42 01/04/2016 16:32 01/04/2016 21:56 01/05/2016 06:56  Glucose-Capillary Latest Ref Range: 65-99 mg/dL 213220 (H) 086183 (H) 578112 (H) 154 (H) 223 (H) 210 (H)   Review of Glycemic Control  Diabetes history: DM 2 Outpatient Diabetes medications: Amaryl 1 mg Daily, Levemir 28 units QHS, Januvia 100 mg Daily Current orders for Inpatient glycemic control: Levemir 22 units QHS, Novolog Sensitive TID  Inpatient Diabetes Program Recommendations: Correction (SSI): Bedtime glucose >200 over the past 2 days. Please consider adding Novolog HS scale.    Thanks,  Christena DeemShannon Arzella Rehmann RN, MSN, [facility] Inpatient Diabetes Coordinator Team Pager [phone] (8a-5p)

## 2016-01-06 ENCOUNTER — Encounter (HOSPITAL_COMMUNITY): Payer: Self-pay | Admitting: Cardiology

## 2016-01-06 DIAGNOSIS — I63311 Cerebral infarction due to thrombosis of right middle cerebral artery: Secondary | ICD-10-CM

## 2016-01-06 LAB — GLUCOSE, CAPILLARY
Glucose-Capillary: 91 mg/dL (ref 65–99)
Glucose-Capillary: 185 mg/dL — ABNORMAL HIGH (ref 65–99)
Glucose-Capillary: 211 mg/dL — ABNORMAL HIGH (ref 65–99)

## 2016-01-06 MED ORDER — ASPIRIN 325 MG PO TBEC: 325.0000 mg | DELAYED_RELEASE_TABLET | Freq: Every day | ORAL | Status: DC

## 2016-01-06 MED ORDER — ATORVASTATIN CALCIUM 40 MG PO TABS: 40.0000 mg | ORAL_TABLET | Freq: Every day | ORAL | Status: DC

## 2016-01-06 NOTE — Progress Notes (Signed)
Discharge orders received.  Discharge instructions and follow-up appointments reviewed with the patient.  VSS upon discharge.  IV removed and education complete.  Transported out via wheelchair.   Taos Tapp M, RN 

## 2016-01-06 NOTE — Discharge Summary (Signed)
Catherine Bautista, is a 65 y.o. female  DOB [birth year]  MRN [id number].  Admission date:  01/02/2016  Admitting Physician  Lorretta HarpXilin Niu, MD  Discharge Date:  01/06/2016   Primary MD  No PCP Per Patient  Recommendations for primary care physician for things to follow:  - To follow with neurology in 8 weeks - Please monitor CBG closely and adjust diabetes regimen as needed given  glycohemoglobin A1c of 9.9   Admission Diagnosis  Depression with anxiety [F41.8] Dyslipidemia [E78.5] Stroke (cerebrum) (HCC) [I63.9] Right arm weakness [R29.898] Essential hypertension [I10]   Discharge Diagnosis  Depression with anxiety [F41.8] Dyslipidemia [E78.5] Stroke (cerebrum) (HCC) [I63.9] Right arm weakness [R29.898] Essential hypertension [I10]    Principal Problem:   Stroke [facility](HCC) Active Problems:   Anemia, unspecified   Dyslipidemia   Hypertension   Anemia   Depression with anxiety   Right arm weakness   Acute CVA (cerebrovascular accident) (HCC)   Essential hypertension   Stroke (cerebrum) (HCC)   Type 2 diabetes mellitus with hyperglycemia, with long-term current use of insulin (HCC)      Past Medical History  Diagnosis Date  . Anemia   . Anemia, vitamin B12 deficiency   . Obesity   . Fibromyalgia   . Dyslipidemia   . Hypertension   . Diabetes mellitus   . Stroke (HCC)   . Depression with anxiety     Past Surgical History  Procedure Laterality Date  . Gastric bypass  1981  . Cholecystectomy      laproscopic  . Abdominal hysterectomy  2007  . Therapuetic abortion  1986    associated with C-section  . Tee without cardioversion N/A 01/05/2016    Procedure: TRANSESOPHAGEAL ECHOCARDIOGRAM (TEE);  Surgeon: Quintella Reichertraci R Turner, MD;  [facility];  Service: Cardiovascular;  Laterality: N/A;  . Ep implantable device N/A 01/05/2016    Procedure: Loop Recorder Insertion;  Surgeon: Will Jorja LoaMartin Camnitz,  MD;  Location: [facility] INVASIVE CV LAB;  Service: Cardiovascular;  Laterality: N/A;       History of present illness and  Hospital Course:     Kindly see H&P for history of present illness and admission details, please review complete Labs, Consult reports and Test reports for all details in brief  HPI  from the history and physical done on the day of admission 01/02/2016  HPI: Catherine Bautista is a 65 y.o. female with medical history significant of stroke, anemia, obesity, fibromyalgia, hypertension, hyperlipidemia, diabetes mellitus, depression, anxiety, who presents with right arm weakness and numbness.  Patient reports that she started having right arm numbness and weakness at about 5 PM. It has improved since then, but still has some mild weakness and numbness. She also has tingling sensations in her right toes. No leg weakness. Patient does not have slurry speech, vision change, hearing loss. She feels nauseated, but no vomiting, abdominal pain or diarrhea. Patient does not have fever, chills, chest pain, shortness of breath, cough, symptoms of UTI.  ED Course: pt was found  to have INR 1.0, PTT 29, troponin negative, temperature normal, no tachycardia, no tachypnea, electrolytes renal function okay. CT head is negative for acute intracranial abnormalities. Patient is placed on telemetry bed for observation. Neurology was consulted by EDP.   Hospital Course  65 y.o. female with medical history significant of stroke, anemia, obesity, fibromyalgia, hypertension, hyperlipidemia, diabetes mellitus, depression, anxiety, who presents with right arm weakness and numbness.  Patient reports that she started having right arm numbness and weakness at about 5 PM. It has improved since then, but still has some mild weakness and numbness. She also has tingling sensations in her right toes. No leg weakness. Patient does not have slurry speech, vision change, hearing loss. She feels nauseated, but no vomiting,  abdominal pain or diarrhea. Patient does not have fever, chills, chest pain, shortness of breath, cough, symptoms of UTI.  Feeling better and complaining just of mild numbness/weakness on right side. Plan is to discharge Home with outpatient PT .  Stroke [facility]): pt has history of stroke twice, last one was 2011. Now presents with right arm weakness and numbness. She also has tingling sensations in right toes, concerning for new stroke. CT head is negative for acute intracranial abnormalities. Left cortical infarct seen on MRI. -per neurology plan is for plavix and aspirin (full dose) for 3 months; then a follow up will decide long term secondary prevention (patient is a candidate for RESPECT ESUS trial)  -will follow neurology rec's;S/P loop recorder on 6/6 . TEE with some abnormalities, but not consider to be source of emboli. -continue risk factors modifications  HLD:  -continue Lipitor (adjusted for better control) and Zetia -LDL 96, goal is <70  HTN: -continue ramipril and HCTZ   Depression and anxiety: -Continue home medications: Amitriptyline, Wellbutrin and Valium  Anemia:  - Hemoglobin stable, 11.1 on admission   DM-II: Last A1c 8.7 on 01/23/10 poorly controled at that time -Patient is taking Levemir, Janumet at home -Resume home regimen on discharge, no adjustment giving labile CBGs during hospital stay -A1c 9.9    Discharge Condition:  stable   Follow UP  Follow-up Information    Follow up with [facility] On 01/13/2016.   Specialty:  Cardiology   Why:  12:00 noon, wound check   Contact information:   [address], [address] [place] [address] [phone]      Follow up with Xu,Jindong, MD. Schedule an appointment as soon as possible for a visit in 2 months.   Specialty:  Neurology   Why:  stroke clinic   Contact information:   [address] [place] [address] [phone]         Discharge  Instructions  and  Discharge Medications         Discharge Instructions    Ambulatory referral to Neurology    Complete by:  As directed   Pt will follow up with Dr. Roda Shutters at [facility] in about 2 months. Thanks.     Discharge instructions    Complete by:  As directed   Follow with Primary MD in 7 days   Get CBC, CMP,checked  by Primary MD next visit.    Activity: As tolerated with Full fall precautions use walker/cane & assistance as needed   Disposition Home    Diet: Heart Healthy  , with feeding assistance and aspiration precautions.  For Heart failure patients - Check your Weight same time everyday, if you gain over 2 pounds, or  you develop in leg swelling, experience more shortness of breath or chest pain, call your Primary MD immediately. Follow Cardiac Low Salt Diet and 1.5 lit/day fluid restriction.   On your next visit with your primary care physician please Get Medicines reviewed and adjusted.   Please request your Prim.MD to go over all Hospital Tests and Procedure/Radiological results at the follow up, please get all Hospital records sent to your Prim MD by signing hospital release before you go home.   If you experience worsening of your admission symptoms, develop shortness of breath, life threatening emergency, suicidal or homicidal thoughts you must seek medical attention immediately by calling 911 or calling your MD immediately  if symptoms less severe.  You Must read complete instructions/literature along with all the possible adverse reactions/side effects for all the Medicines you take and that have been prescribed to you. Take any new Medicines after you have completely understood and accpet all the possible adverse reactions/side effects.   Do not drive, operating heavy machinery, perform activities at heights, swimming or participation in water activities or provide baby sitting services if your were admitted for syncope or siezures until you have seen by Primary MD  or a Neurologist and advised to do so again.  Do not drive when taking Pain medications.    Do not take more than prescribed Pain, Sleep and Anxiety Medications  Special Instructions: If you have smoked or chewed Tobacco  in the last 2 yrs please stop smoking, stop any regular Alcohol  and or any Recreational drug use.  Wear Seat belts while driving.   Please note  You were cared for by a hospitalist during your hospital stay. If you have any questions about your discharge medications or the care you received while you were in the hospital after you are discharged, you can call the unit and asked to speak with the hospitalist on call if the hospitalist that took care of you is not available. Once you are discharged, your primary care physician will handle any further medical issues. Please note that NO REFILLS for any discharge medications will be authorized once you are discharged, as it is imperative that you return to your primary care physician (or establish a relationship with a primary care physician if you do not have one) for your aftercare needs so that they can reassess your need for medications and monitor your lab values.     Increase activity slowly    Complete by:  As directed             Medication List    STOP taking these medications        insulin glargine 100 UNIT/ML injection  Commonly known as:  LANTUS     naproxen 500 MG tablet  Commonly known as:  NAPROSYN     phenyltoloxamine-acetaminophen 50-500 MG tablet      TAKE these medications        amitriptyline 10 MG tablet  Commonly known as:  ELAVIL  Take 5 mg by mouth at bedtime as needed for sleep.     aspirin 325 MG EC tablet  Take 1 tablet (325 mg total) by mouth daily.     atorvastatin 40 MG tablet  Commonly known as:  LIPITOR  Take 1 tablet (40 mg total) by mouth daily at 6 PM.     b complex vitamins tablet  Take 1 tablet by mouth daily.     buPROPion 150 MG 24 hr tablet  Commonly known as:  WELLBUTRIN XL  Take 150 mg by mouth every other day.     clopidogrel 75 MG tablet  Commonly known as:  PLAVIX  Take 75 mg by mouth daily.     cyclobenzaprine 10 MG tablet  Commonly known as:  FLEXERIL  Take 1 tablet (10 mg total) by mouth 3 (three) times daily.     CYCLOSET PO  Take 0.48 mg by mouth daily.     diazepam 5 MG tablet  Commonly known as:  VALIUM  Take 1 tablet (5 mg total) by mouth every 12 (twelve) hours as needed for muscle spasms.     ezetimibe 10 MG tablet  Commonly known as:  ZETIA  Take 10 mg by mouth daily.     glimepiride 1 MG tablet  Commonly known as:  AMARYL  Take 1 mg by mouth daily with breakfast.     hydrochlorothiazide 25 MG tablet  Commonly known as:  HYDRODIURIL  Take 25 mg by mouth daily.     HYDROcodone-acetaminophen 5-325 MG tablet  Commonly known as:  NORCO/VICODIN  Take 1-2 tablets by mouth every 4 (four) hours as needed.     JANUMET PO  Take 500 mg by mouth 2 (two) times daily.     JANUVIA 100 MG tablet  Generic drug:  sitaGLIPtin  Take 100 mg by mouth daily.     LEVEMIR FLEXPEN [place]  Inject 28 Units into the skin at bedtime.     multivitamin per tablet  Take 1 tablet by mouth daily.     Omega 3 1200 MG Caps  Take 1,200 mg by mouth at bedtime.     ramipril 10 MG capsule  Commonly known as:  ALTACE  Take 10 mg by mouth daily.     simvastatin 20 MG tablet  Commonly known as:  ZOCOR  Take 20 mg by mouth daily.          Diet and Activity recommendation: See Discharge Instructions above  Consults obtained -  Neurology Cardiology consulted for TEE and loop recorder insertion   Major procedures and Radiology Reports - PLEASE review detailed and final reports for all details, in brief -   TEE Loop recorder insertion   Ct Head Wo Contrast  01/02/2016  CLINICAL DATA:  Acute onset of worsening unilateral right-sided weakness. Tingling and heaviness in the right arm. Initial encounter. EXAM: CT HEAD WITHOUT CONTRAST  TECHNIQUE: Contiguous axial images were obtained from the base of the skull through the vertex without intravenous contrast. COMPARISON:  CT of the head performed 01/22/2010, and MRI of the brain performed 01/23/2010 FINDINGS: There is no evidence of acute infarction, mass lesion, or intra- or extra-axial hemorrhage on CT. Prominence of sulci suggests mild cortical volume loss. There is chronic infarct at the left parietal and temporal lobes, with associated encephalomalacia. Mild periventricular white matter change likely reflects small vessel ischemic microangiopathy. The brainstem and fourth ventricle are within normal limits. The basal ganglia are unremarkable in appearance. The cerebral hemispheres demonstrate grossly normal gray-white differentiation. No mass effect or midline shift is seen. There is no evidence of fracture; visualized osseous structures are unremarkable in appearance. There is incomplete fusion of the posterior arch of C1. The orbits are within normal limits. The paranasal sinuses and mastoid air cells are well-aerated. No significant soft tissue abnormalities are seen. IMPRESSION: 1. No acute intracranial pathology seen on CT. 2. Mild cortical volume loss and scattered small vessel ischemic microangiopathy. 3. Chronic infarct at the left parietal and temporal  lobes, with associated encephalomalacia. Electronically Signed   By: Roanna Raider M.D.   On: 01/02/2016 18:16   Mr Brain Wo Contrast  01/02/2016  CLINICAL DATA:  Initial evaluation for acute right upper extremity missed weakness. EXAM: MRI HEAD WITHOUT CONTRAST MRA HEAD WITHOUT CONTRAST TECHNIQUE: Multiplanar, multiecho pulse sequences of the brain and surrounding structures were obtained without intravenous contrast. Angiographic images of the head were obtained using MRA technique without contrast. COMPARISON:  Prior CT from earlier the same day. FINDINGS: MRI HEAD FINDINGS Mildly advanced cerebral atrophy present. No significant  chronic small vessel ischemic disease present. Encephalomalacia with associated gliosis within the left temporal occipital region, consistent with remote left MCA territory infarct. Small remote lacunar infarct within the left thalamus noted. Subtle 5 mm focus of diffusion abnormality within the left precentral gyrus suspicious for a small acute cortical infarction (series 4, image 42). This is also seen on coronal DWI sequence (series 5, image 14). No associated hemorrhage or mass effect. No other acute infarct identified. Gray-white matter differentiation maintained. Major intracranial vascular flow voids are preserved. No mass lesion, midline shift, or mass effect. No hydrocephalus. No extra-axial fluid collection. Major dural sinuses are grossly patent. Craniocervical junction normal. Visualized upper cervical spine unremarkable. Pituitary gland within normal limits. No acute abnormality about the orbits. Mild axial myopia noted. Mild mucosal thickening within the inferior maxillary sinuses. Paranasal sinuses are otherwise clear. No mastoid effusion. Inner ear structures normal. Bone marrow signal intensity within normal limits. No scalp soft tissue abnormality. MRA HEAD FINDINGS ANTERIOR CIRCULATION: Distal cervical segments of the internal carotid arteries are patent with antegrade flow. Petrous segments patent bilaterally. Scattered atheromatous irregularity within the cavernous ICAs bilaterally. Mild to moderate diffuse narrowing of the cavernous right ICA. No significant narrowing on the left. A1 segments patent. Right A1 segment is hypoplastic. Anterior communicating artery normal. Anterior cerebral arteries well opacified to their distal aspects. M1 segments patent without stenosis or occlusion. MCA bifurcations normal. No proximal M2 branch occlusion or stenosis. Distal MCA branches well opacified and symmetric. Mild distal small vessel atheromatous irregularity. POSTERIOR CIRCULATION: Vertebral arteries  code dominant and patent to the vertebrobasilar junction basilar artery widely patent. Superior cerebral arteries patent bilaterally. Both of the posterior cerebral arteries arise the basilar artery no well opacified to their distal aspects. Mild distal small vessel atheromatous irregularity within the PCA branches bilaterally. No aneurysm or vascular malformation. IMPRESSION: MRI HEAD IMPRESSION: 1. 5 mm subtle focus of diffusion abnormality within the left precentral gyrus. Given the patient's symptoms, finding is suspicious for a possible small acute nonhemorrhagic cortical infarct. 2. No other acute intracranial process. 3. Encephalomalacia within the left temporal occipital region, consistent with remote left MCA territory infarct. Additional remote lacunar left thalamic infarct. 4. Mild age-related cerebral atrophy. MRA HEAD IMPRESSION: 1. No large vessel occlusion within intracranial circulation. No high-grade or correctable stenosis. 2. Atheromatous irregularity within the cavernous ICAs bilaterally with mild to moderate diffuse narrowing on the right. Electronically Signed   By: Rise Mu M.D.   On: 01/02/2016 22:50   Mr Maxine Glenn Head/brain Wo Cm  01/02/2016  CLINICAL DATA:  Initial evaluation for acute right upper extremity missed weakness. EXAM: MRI HEAD WITHOUT CONTRAST MRA HEAD WITHOUT CONTRAST TECHNIQUE: Multiplanar, multiecho pulse sequences of the brain and surrounding structures were obtained without intravenous contrast. Angiographic images of the head were obtained using MRA technique without contrast. COMPARISON:  Prior CT from earlier the same day. FINDINGS: MRI HEAD FINDINGS Mildly advanced cerebral atrophy  present. No significant chronic small vessel ischemic disease present. Encephalomalacia with associated gliosis within the left temporal occipital region, consistent with remote left MCA territory infarct. Small remote lacunar infarct within the left thalamus noted. Subtle 5 mm focus  of diffusion abnormality within the left precentral gyrus suspicious for a small acute cortical infarction (series 4, image 42). This is also seen on coronal DWI sequence (series 5, image 14). No associated hemorrhage or mass effect. No other acute infarct identified. Gray-white matter differentiation maintained. Major intracranial vascular flow voids are preserved. No mass lesion, midline shift, or mass effect. No hydrocephalus. No extra-axial fluid collection. Major dural sinuses are grossly patent. Craniocervical junction normal. Visualized upper cervical spine unremarkable. Pituitary gland within normal limits. No acute abnormality about the orbits. Mild axial myopia noted. Mild mucosal thickening within the inferior maxillary sinuses. Paranasal sinuses are otherwise clear. No mastoid effusion. Inner ear structures normal. Bone marrow signal intensity within normal limits. No scalp soft tissue abnormality. MRA HEAD FINDINGS ANTERIOR CIRCULATION: Distal cervical segments of the internal carotid arteries are patent with antegrade flow. Petrous segments patent bilaterally. Scattered atheromatous irregularity within the cavernous ICAs bilaterally. Mild to moderate diffuse narrowing of the cavernous right ICA. No significant narrowing on the left. A1 segments patent. Right A1 segment is hypoplastic. Anterior communicating artery normal. Anterior cerebral arteries well opacified to their distal aspects. M1 segments patent without stenosis or occlusion. MCA bifurcations normal. No proximal M2 branch occlusion or stenosis. Distal MCA branches well opacified and symmetric. Mild distal small vessel atheromatous irregularity. POSTERIOR CIRCULATION: Vertebral arteries code dominant and patent to the vertebrobasilar junction basilar artery widely patent. Superior cerebral arteries patent bilaterally. Both of the posterior cerebral arteries arise the basilar artery no well opacified to their distal aspects. Mild distal small  vessel atheromatous irregularity within the PCA branches bilaterally. No aneurysm or vascular malformation. IMPRESSION: MRI HEAD IMPRESSION: 1. 5 mm subtle focus of diffusion abnormality within the left precentral gyrus. Given the patient's symptoms, finding is suspicious for a possible small acute nonhemorrhagic cortical infarct. 2. No other acute intracranial process. 3. Encephalomalacia within the left temporal occipital region, consistent with remote left MCA territory infarct. Additional remote lacunar left thalamic infarct. 4. Mild age-related cerebral atrophy. MRA HEAD IMPRESSION: 1. No large vessel occlusion within intracranial circulation. No high-grade or correctable stenosis. 2. Atheromatous irregularity within the cavernous ICAs bilaterally with mild to moderate diffuse narrowing on the right. Electronically Signed   By: Rise Mu M.D.   On: 01/02/2016 22:50    Micro Results     No results found for this or any previous visit (from the past 240 hour(s)).     Today   Subjective:   Berda Shelvin today has no headache,no chest abdominal pain,no new weakness tingling or numbness, feels much better wants to go home today.   Objective:   Blood pressure 126/62, pulse 55, temperature 98.6 F (37 C), temperature source Oral, resp. rate 18, height 5\' 6"  (1.676 m), weight 120.294 kg (265 lb 3.2 oz), SpO2 97 %.   Intake/Output Summary (Last 24 hours) at 01/06/16 1404 Last data filed at 01/05/16 2130  Gross per 24 hour  Intake    240 ml  Output      0 ml  Net    240 ml    Exam  General: Afebrile, no dysarthria, oriented X3   Cardiovascular: S1 and S2, no rubs or gallops  Respiratory: CTA bilaterally  Abdomen: soft, obese, NT, ND Musculoskeletal: no cyanosis  or clubbing; patient with some left LE joint pain (around her knee). Mild pitting edema bilaterally Data Review   CBC w Diff:  Lab Results  Component Value Date   WBC 5.9 01/02/2016   WBC 5.2 07/04/2011   HGB  11.1* 01/02/2016   HGB 11.2* 07/04/2011   HCT 33.9* 01/02/2016   HCT 33.4* 07/04/2011   PLT 293 01/02/2016   PLT 254 07/04/2011   LYMPHOPCT 36 01/02/2016   LYMPHOPCT 31.6 07/04/2011   MONOPCT 11 01/02/2016   MONOPCT 8.9 07/04/2011   EOSPCT 1 01/02/2016   EOSPCT 2.0 07/04/2011   BASOPCT 0 01/02/2016   BASOPCT 0.4 07/04/2011    CMP:  Lab Results  Component Value Date   NA 137 01/02/2016   K 4.4 01/02/2016   CL 105 01/02/2016   CO2 27 01/02/2016   BUN 17 01/02/2016   CREATININE 1.04* 01/02/2016   PROT 7.0 01/02/2016   ALBUMIN 3.5 01/02/2016   BILITOT 0.4 01/02/2016   ALKPHOS 95 01/02/2016   AST 24 01/02/2016   ALT 20 01/02/2016  .   Total Time in preparing paper work, data evaluation and todays exam - 35 minutes  ELGERGAWY, DAWOOD M.D on 01/06/2016 at 2:04 PM  [facility]   Office  [phone]

## 2016-01-06 NOTE — Discharge Instructions (Signed)
Follow with Primary MDin 7 days  ? ?Get CBC, CMP,  checked  by Primary MD next visit.  ? ? ?Activity: As tolerated with Full fall precautions use walker/cane & assistance as needed ? ?Disposition Home ? ? ?Diet: Heart Healthy  , with feeding assistance and aspiration precautions. ? ?For Heart failure patients - Check your Weight same time everyday, if you gain over 2 pounds, or you develop in leg swelling, experience more shortness of breath or chest pain, call your Primary MD immediately. Follow Cardiac Low Salt Diet and 1.5 lit/day fluid restriction. ? ? ?On your next visit with your primary care physician please Get Medicines reviewed and adjusted. ? ? ?Please request your Prim.MD to go over all Hospital Tests and Procedure/Radiological results at the follow up, please get all Hospital records sent to your Prim MD by signing hospital release before you go home. ? ? ?If you experience worsening of your admission symptoms, develop shortness of breath, life threatening emergency, suicidal or homicidal thoughts you must seek medical attention immediately by calling 911 or calling your MD immediately  if symptoms less severe. ? ?You Must read complete instructions/literature along with all the possible adverse reactions/side effects for all the Medicines you take and that have been prescribed to you. Take any new Medicines after you have completely understood and accpet all the possible adverse reactions/side effects.  ? ?Do not drive, operating heavy machinery, perform activities at heights, swimming or participation in water activities or provide baby sitting services if your were admitted for syncope or siezures until you have seen by Primary MD or a Neurologist and advised to do so again. ? ?Do not drive when taking Pain medications.  ? ? ?Do not take more than prescribed Pain, Sleep and Anxiety Medications ? ?Special Instructions: If you have smoked or chewed Tobacco  in the last 2 yrs please stop smoking, stop  any regular Alcohol  and or any Recreational drug use. ? ?Wear Seat belts while driving. ? ? ?Please note ? ?You were cared for by a hospitalist during your hospital stay. If you have any questions about your discharge medications or the care you received while you were in the hospital after you are discharged, you can call the unit and asked to speak with the hospitalist on call if the hospitalist that took care of you is not available. Once you are discharged, your primary care physician will handle any further medical issues. Please note that NO REFILLS for any discharge medications will be authorized once you are discharged, as it is imperative that you return to your primary care physician (or establish a relationship with a primary care physician if you do not have one) for your aftercare needs so that they can reassess your need for medications and monitor your lab values.  ? ?

## 2016-01-13 ENCOUNTER — Encounter: Payer: Self-pay | Admitting: Cardiology

## 2016-01-13 ENCOUNTER — Ambulatory Visit (INDEPENDENT_AMBULATORY_CARE_PROVIDER_SITE_OTHER): Payer: BC Managed Care – PPO | Admitting: *Deleted

## 2016-01-13 DIAGNOSIS — I639 Cerebral infarction, unspecified: Secondary | ICD-10-CM

## 2016-01-13 NOTE — Progress Notes (Signed)
Loop check in clinic. Steri-Strips removed. Wound edges approximated and wound well healed. No redness, drainage or swelling. Battery status: Good. R-waves 0.3716mV. 0 symptom episodes, 0 tachy episodes, 0 pause episodes, 0 brady episodes. 0 AF episodes. Monthly summary reports and ROV with WC PRN. Pt wound education completed, billing and pt follow-up.

## 2016-02-04 ENCOUNTER — Ambulatory Visit (INDEPENDENT_AMBULATORY_CARE_PROVIDER_SITE_OTHER): Payer: BC Managed Care – PPO | Admitting: *Deleted

## 2016-02-04 DIAGNOSIS — I639 Cerebral infarction, unspecified: Secondary | ICD-10-CM | POA: Diagnosis not present

## 2016-02-08 NOTE — Progress Notes (Signed)
Carelink Summary Report / Loop Recorder 

## 2016-02-18 LAB — CUP PACEART REMOTE DEVICE CHECK: Date Time Interrogation Session: 20170706194251

## 2016-03-07 ENCOUNTER — Ambulatory Visit (INDEPENDENT_AMBULATORY_CARE_PROVIDER_SITE_OTHER): Payer: BC Managed Care – PPO | Admitting: *Deleted

## 2016-03-07 DIAGNOSIS — I639 Cerebral infarction, unspecified: Secondary | ICD-10-CM

## 2016-03-07 NOTE — Progress Notes (Signed)
Carelink Summary Report / Loop Recorder 

## 2016-03-14 LAB — CUP PACEART REMOTE DEVICE CHECK: Date Time Interrogation Session: 20170805200527

## 2016-03-22 ENCOUNTER — Encounter: Payer: Self-pay | Admitting: Neurology

## 2016-03-22 ENCOUNTER — Ambulatory Visit (INDEPENDENT_AMBULATORY_CARE_PROVIDER_SITE_OTHER): Payer: BC Managed Care – PPO | Admitting: Neurology

## 2016-03-22 VITALS — BP 104/58 | HR 57 | Ht 66.0 in | Wt 267.8 lb

## 2016-03-22 DIAGNOSIS — Z8673 Personal history of transient ischemic attack (TIA), and cerebral infarction without residual deficits: Secondary | ICD-10-CM

## 2016-03-22 DIAGNOSIS — E785 Hyperlipidemia, unspecified: Secondary | ICD-10-CM

## 2016-03-22 DIAGNOSIS — Z794 Long term (current) use of insulin: Secondary | ICD-10-CM | POA: Diagnosis not present

## 2016-03-22 DIAGNOSIS — I10 Essential (primary) hypertension: Secondary | ICD-10-CM | POA: Diagnosis not present

## 2016-03-22 DIAGNOSIS — E1165 Type 2 diabetes mellitus with hyperglycemia: Secondary | ICD-10-CM

## 2016-03-22 DIAGNOSIS — I63412 Cerebral infarction due to embolism of left middle cerebral artery: Secondary | ICD-10-CM | POA: Diagnosis not present

## 2016-03-22 NOTE — Patient Instructions (Addendum)
-   continue ASA and plavix and lipitor for stroke prevention for now - will introduce clinical study for stroke prevention - Follow up with your primary care physician for stroke risk factor modification. Recommend maintain blood pressure goal <130/80, diabetes with hemoglobin A1c goal below 7.0% and lipids with LDL cholesterol goal below 70 mg/dL.  - check BP and glucose at home and record - continue loop recorder monitoring - diabetic diet and regular exercise  - follow up in 3 months.

## 2016-03-23 DIAGNOSIS — Z8673 Personal history of transient ischemic attack (TIA), and cerebral infarction without residual deficits: Secondary | ICD-10-CM | POA: Insufficient documentation

## 2016-03-23 NOTE — Progress Notes (Signed)
STROKE NEUROLOGY FOLLOW UP NOTE  NAME: SHERONDA PARRAN DOB: [birth year]  REASON FOR VISIT: stroke follow up HISTORY FROM: pt and chart  Today we had the pleasure of seeing LORREN ROSSETTI in follow-up at our Neurology Clinic. Pt was accompanied by no one.   History Summary Ms. MIIA BLANKS is a 65 y.o. female with history of diabetes mellitus, hypertension, hyperlipidemia, previous strokes, obesity, and fibromyalgia was admitted on 01/02/16 for right upper extremity numbness and weakness. MRI showed small left frontal cortical infarct, MRA mild to moderate narrowing of the right ICA. CUS, TTE and DVT negative. TEE showed redundant chordae tendinea, thin mobile density anterior MV could be small ruptured chordae tendinae but not rule out small vegetation. However, pt had no sign of endocarditis. Loop recorder placed. LDL 96 and A1C 9.9. She was discharged with DAPT as she failed plavix PTA. Her lipitor increased from 20 to 40mg . Symptoms resolved and she was discharged home.    History of stroke  2002, left MCA infarct  12/2009, right frontal cortical infarct  No residue  Interval History During the interval time, the patient has been doing well. No stroke like symptoms. BP 104/58. Glucose fluctuates at home as her lantus changed to levemir. On DAPT. Interested in RESPECT ESUS trial.     REVIEW OF SYSTEMS: Full 14 system review of systems performed and notable only for those listed below and in HPI above, all others are negative:  Constitutional:  Weight gain, fatigue Cardiovascular: swelling in legs Ear/Nose/Throat:  Ringing in ears Skin:  Eyes:   Respiratory:  snoring  Gastroitestinal:   Genitourinary:  Hematology/Lymphatic:   Endocrine:  Musculoskeletal:   Allergy/Immunology:   Neurological:  HA, dizziness Psychiatric: not enough sleep, decreased energy Sleep: insomnia  The following represents the patient's updated allergies and side effects list: Allergies  Allergen Reactions  .  Demerol Nausea And Vomiting  . Excedrin Extra Strength [Aspirin-Acetaminophen-Caffeine] Other (See Comments)    Weird feeling  . Invokana [Canagliflozin] Other (See Comments)    Decreasing renal function and extremely lethargic  . Janumet [Sitagliptin-Metformin Hcl] Other (See Comments)    Bloating and gas  . Morphine And Related Other (See Comments)    Weird feeling  . Novocain [Procaine Hcl] Nausea And Vomiting    The neurologically relevant items on the patient's problem list were reviewed on today's visit.  Neurologic Examination  A problem focused neurological exam (12 or more points of the single system neurologic examination, vital signs counts as 1 point, cranial nerves count for 8 points) was performed.  Blood pressure (!) 104/58, pulse (!) 57, height 5\' 6"  (1.676 m), weight 267 lb 12.8 oz (121.5 kg).  General - Well nourished, well developed, in no apparent distress.  Ophthalmologic - Sharp disc margins OU.  Cardiovascular - Regular rate and rhythm.  Mental Status -  Level of arousal and orientation to time, place, and person were intact. Language including expression, naming, repetition, comprehension was assessed and found intact. Fund of Knowledge was assessed and was intact  Cranial Nerves II - XII - II - Visual field intact OU. III, IV, VI - Extraocular movements intact. V - Facial sensation intact bilaterally. VII - Facial movement intact bilaterally VIII - Hearing & vestibular intact bilaterally. X - Palate elevates symmetrically. XI - Chin turning & shoulder shrug intact bilaterally. XII - Tongue protrusion intact.  Motor Strength - The patient's strength was normal in all extremities and pronator drift was absent.  Bulk was  normal and fasciculations were absent.   Motor Tone - Muscle tone was assessed at the neck and appendages and was normal.  Reflexes - The patient's reflexes were 1+ in all extremities and she had no pathological reflexes.  Sensory -  Light touch, temperature/pinprick, vibration and proprioception, and Romberg testing were assessed and were normal.    Coordination - The patient had normal movements in the hands and feet with no ataxia or dysmetria.  Tremor was absent.  Gait and Station - The patient's transfers, posture, gait, station, and turns were observed as normal.   Functional score  mRS = 0   0 - No symptoms.   1 - No significant disability. Able to carry out all usual activities, despite some symptoms.   2 - Slight disability. Able to look after own affairs without assistance, but unable to carry out all previous activities.   3 - Moderate disability. Requires some help, but able to walk unassisted.   4 - Moderately severe disability. Unable to attend to own bodily needs without assistance, and unable to walk unassisted.   5 - Severe disability. Requires constant nursing care and attention, bedridden, incontinent.   6 - Dead.   NIH Stroke Scale = 0   Data reviewed: I personally reviewed the images and agree with the radiology interpretations.  Ct Head Wo Contrast 01/02/2016   1. No acute intracranial pathology seen on CT.  2. Mild cortical volume loss and scattered small vessel ischemic microangiopathy.  3. Chronic infarct at the left parietal and temporal lobes, with associated encephalomalacia.   MRI HEAD  01/02/2016   1. 5 mm subtle focus of diffusion abnormality within the left precentral gyrus. Given the patient's symptoms, finding is suspicious for a possible small acute nonhemorrhagic cortical infarct.  2. No other acute intracranial process.  3. Encephalomalacia within the left temporal occipital region, consistent with remote left MCA territory infarct. Additional remote lacunar left thalamic infarct.  4. Mild age-related cerebral atrophy.   MRA HEAD  01/02/2016   1. No large vessel occlusion within intracranial circulation. No high-grade or correctable stenosis.  2. Atheromatous  irregularity within the cavernous ICAs bilaterally with mild to moderate diffuse narrowing on the right.   2-D echocardiogram - Left ventricle: The cavity size was normal. Wall thickness was normal. Systolic function was normal. The estimated ejection fraction was in the range of 60% to 65%. Wall motion was normal; there were no regional wall motion abnormalities. Doppler parameters are consistent with abnormal left ventricular relaxation (grade 1 diastolic dysfunction).  Carotid Doppler   There is 1-39% bilateral ICA stenosis. Vertebral artery flow is antegrade. '  Lower extremity venous Doppler There is no DVT or SVT noted in the bilateral lower extremities.   TEE Normal LV size and function Normal RV size and function Normal RA Mildly enlarged LA Normal TV with mild TR Normal PV There appears to me redundant chordae tendinae with mild MR. There is a very thin mobile density in the vicinity of the anterior mitral valve that could be a small ruptured chordae tendinae but cannot rule out a small vegetation. Endocarditis is a clinical diagnosis made by positive blood cultures and physical exam findings.  Normal trileaflet AV with mild aortic valve sclerosis but no stenosis and mild AI Markedly positive agitated saline contrast study within 2 cardiac cycles consistent with patent foramen ovale.  Normal thoracic and ascending aorta.  Component     Latest Ref Rng & Units 01/03/2016  Cholesterol  0 - 200 mg/dL 161153  Triglycerides     <150 mg/dL 69  HDL Cholesterol     >40 mg/dL 43  Total CHOL/HDL Ratio     RATIO 3.6  VLDL     0 - 40 mg/dL 14  LDL (calc)     0 - 99 mg/dL 96  Hemoglobin [id number]     4.8 - 5.6 % 9.9 (H)  Mean Plasma Glucose     mg/dL [id number]    Assessment: As you may recall, she is a 65 y.o. African American female with PMH of diabetes mellitus, hypertension, hyperlipidemia, previous strokes in left MCA in 2002 and right frontal in 2011, obesity, and fibromyalgia  was admitted on 01/02/16 for punctate left frontal cortical infarct, MRA mild to moderate narrowing of the right ICA. CUS, TTE and DVT negative. TEE showed redundant chordae tendinea, thin mobile density anterior MV could be small ruptured chordae tendinae but not rule out small vegetation. However, pt had no sign of endocarditis. Loop recorder placed. LDL 96 and A1C 9.9. She was discharged with DAPT as she failed plavix PTA. Her lipitor increased from 20 to 40mg . Symptoms resolved and she was discharged home. During the interval time, the patient has been doing well. Glucose fluctuates at home. Interested in RESPECT ESUS trial.  Plan:  - continue ASA and plavix for one more month and then plavix alone.  - continue lipitor for stroke prevention - introduce RESPECT ESUS trial for stroke prevention - Follow up with your primary care physician for stroke risk factor modification. Recommend maintain blood pressure goal <130/80, diabetes with hemoglobin A1c goal below 7.0% and lipids with LDL cholesterol goal below 70 mg/dL.  - check BP and glucose at home and record - continue loop recorder monitoring - diabetic diet and regular exercise  - follow up in 3 months.   No orders of the defined types were placed in this encounter.   No orders of the defined types were placed in this encounter.   Patient Instructions  - continue ASA and plavix and lipitor for stroke prevention for now - will introduce clinical study for stroke prevention - Follow up with your primary care physician for stroke risk factor modification. Recommend maintain blood pressure goal <130/80, diabetes with hemoglobin A1c goal below 7.0% and lipids with LDL cholesterol goal below 70 mg/dL.  - check BP and glucose at home and record - continue loop recorder monitoring - diabetic diet and regular exercise  - follow up in 3 months.    Marvel PlanJindong Karaline Buresh, MD PhD [facility] [address], [address] [place], [place]  [id number] [phone]

## 2016-04-05 ENCOUNTER — Ambulatory Visit (INDEPENDENT_AMBULATORY_CARE_PROVIDER_SITE_OTHER): Payer: BC Managed Care – PPO | Admitting: *Deleted

## 2016-04-05 DIAGNOSIS — I639 Cerebral infarction, unspecified: Secondary | ICD-10-CM

## 2016-04-06 NOTE — Progress Notes (Signed)
Carelink Summary Report / Loop Recorder 

## 2016-04-30 LAB — CUP PACEART REMOTE DEVICE CHECK: Date Time Interrogation Session: 20170904203608

## 2016-04-30 NOTE — Progress Notes (Signed)
Carelink summary report received. Battery status OK. Normal device function. No new symptom episodes, tachy episodes, brady, or pause episodes. No new AF episodes. Monthly summary reports and ROV/PRN 

## 2016-05-04 ENCOUNTER — Ambulatory Visit (INDEPENDENT_AMBULATORY_CARE_PROVIDER_SITE_OTHER): Payer: BC Managed Care – PPO | Admitting: *Deleted

## 2016-05-04 DIAGNOSIS — I639 Cerebral infarction, unspecified: Secondary | ICD-10-CM | POA: Diagnosis not present

## 2016-05-05 NOTE — Progress Notes (Signed)
Carelink Summary Report / Loop Recorder 

## 2016-06-03 ENCOUNTER — Ambulatory Visit (INDEPENDENT_AMBULATORY_CARE_PROVIDER_SITE_OTHER): Payer: BC Managed Care – PPO | Admitting: *Deleted

## 2016-06-03 DIAGNOSIS — I639 Cerebral infarction, unspecified: Secondary | ICD-10-CM

## 2016-06-06 NOTE — Progress Notes (Signed)
Carelink Summary Report / Loop Recorder 

## 2016-06-12 LAB — CUP PACEART REMOTE DEVICE CHECK
Date Time Interrogation Session: 20171004210727
Implantable Pulse Generator Implant Date: 20170606

## 2016-06-12 NOTE — Progress Notes (Signed)
Carelink summary report received. Battery status OK. Normal device function. No new symptom episodes, tachy episodes, brady, or pause episodes. No new AF episodes. Monthly summary reports and ROV/PRN 

## 2016-06-30 ENCOUNTER — Ambulatory Visit: Payer: BC Managed Care – PPO | Admitting: Neurology

## 2016-07-04 ENCOUNTER — Ambulatory Visit (INDEPENDENT_AMBULATORY_CARE_PROVIDER_SITE_OTHER): Payer: BC Managed Care – PPO | Admitting: *Deleted

## 2016-07-04 DIAGNOSIS — I639 Cerebral infarction, unspecified: Secondary | ICD-10-CM | POA: Diagnosis not present

## 2016-07-04 NOTE — Progress Notes (Signed)
Carelink Summary Report / Loop Recorder 

## 2016-07-13 LAB — CUP PACEART REMOTE DEVICE CHECK
Date Time Interrogation Session: 20171103214113
Implantable Pulse Generator Implant Date: 20170606

## 2016-07-13 NOTE — Progress Notes (Signed)
Carelink summary report received. Battery status OK. Normal device function. No new symptom episodes, tachy episodes, brady, or pause episodes. No new AF episodes. Monthly summary reports and ROV/PRN 

## 2016-08-02 ENCOUNTER — Ambulatory Visit (INDEPENDENT_AMBULATORY_CARE_PROVIDER_SITE_OTHER): Payer: BC Managed Care – PPO | Admitting: *Deleted

## 2016-08-02 DIAGNOSIS — I639 Cerebral infarction, unspecified: Secondary | ICD-10-CM

## 2016-08-04 NOTE — Progress Notes (Signed)
Carelink Summary Report / Loop Recorder 

## 2016-08-11 LAB — CUP PACEART REMOTE DEVICE CHECK
Date Time Interrogation Session: 20171203223633
Implantable Pulse Generator Implant Date: 20170606

## 2016-09-01 ENCOUNTER — Ambulatory Visit (INDEPENDENT_AMBULATORY_CARE_PROVIDER_SITE_OTHER): Payer: BC Managed Care – PPO | Admitting: *Deleted

## 2016-09-01 DIAGNOSIS — I639 Cerebral infarction, unspecified: Secondary | ICD-10-CM | POA: Diagnosis not present

## 2016-09-05 NOTE — Progress Notes (Signed)
Carelink Summary Report / Loop Recorder 

## 2016-09-12 LAB — CUP PACEART REMOTE DEVICE CHECK
Date Time Interrogation Session: 20180102230642
Implantable Pulse Generator Implant Date: 20170606

## 2016-09-30 LAB — CUP PACEART REMOTE DEVICE CHECK
Date Time Interrogation Session: 20180201231104
Implantable Pulse Generator Implant Date: 20170606

## 2016-10-04 ENCOUNTER — Encounter: Payer: BC Managed Care – PPO | Admitting: *Deleted

## 2016-10-31 ENCOUNTER — Ambulatory Visit (INDEPENDENT_AMBULATORY_CARE_PROVIDER_SITE_OTHER): Payer: BC Managed Care – PPO | Admitting: *Deleted

## 2016-10-31 DIAGNOSIS — I639 Cerebral infarction, unspecified: Secondary | ICD-10-CM

## 2016-11-01 NOTE — Progress Notes (Signed)
Carelink Summary Report / Loop Recorder 

## 2016-11-02 LAB — CUP PACEART REMOTE DEVICE CHECK
Date Time Interrogation Session: 20180403041136
Implantable Pulse Generator Implant Date: 20170606

## 2016-11-30 ENCOUNTER — Ambulatory Visit (INDEPENDENT_AMBULATORY_CARE_PROVIDER_SITE_OTHER): Payer: BC Managed Care – PPO | Admitting: *Deleted

## 2016-11-30 DIAGNOSIS — I639 Cerebral infarction, unspecified: Secondary | ICD-10-CM | POA: Diagnosis not present

## 2016-12-01 NOTE — Progress Notes (Signed)
Carelink Summary Report / Loop Recorder 

## 2016-12-12 LAB — CUP PACEART REMOTE DEVICE CHECK
Date Time Interrogation Session: 20180503043929
Implantable Pulse Generator Implant Date: 20170606

## 2016-12-30 ENCOUNTER — Ambulatory Visit (INDEPENDENT_AMBULATORY_CARE_PROVIDER_SITE_OTHER): Payer: BC Managed Care – PPO | Admitting: *Deleted

## 2016-12-30 DIAGNOSIS — I639 Cerebral infarction, unspecified: Secondary | ICD-10-CM

## 2017-01-02 LAB — CUP PACEART REMOTE DEVICE CHECK
Date Time Interrogation Session: 20180602044913
Implantable Pulse Generator Implant Date: 20170606

## 2017-01-02 NOTE — Progress Notes (Signed)
Carelink Summary Report / Loop Recorder 

## 2017-01-10 ENCOUNTER — Other Ambulatory Visit: Payer: Self-pay | Admitting: Obstetrics and Gynecology

## 2017-01-24 ENCOUNTER — Encounter (INDEPENDENT_AMBULATORY_CARE_PROVIDER_SITE_OTHER): Payer: Self-pay | Admitting: Neurology

## 2017-01-24 DIAGNOSIS — I639 Cerebral infarction, unspecified: Secondary | ICD-10-CM

## 2017-01-24 DIAGNOSIS — I63412 Cerebral infarction due to embolism of left middle cerebral artery: Secondary | ICD-10-CM

## 2017-01-24 MED ORDER — CLOPIDOGREL BISULFATE 75 MG PO TABS: 75.0000 mg | ORAL_TABLET | Freq: Every day | ORAL | 3 refills | Status: DC

## 2017-01-24 NOTE — Progress Notes (Signed)
Pt came in for endo of study visit. She has no complains. No bleeding side effects. Neuro and general exam normal.   She had left MCA infarct in 2002, right frontal in 2011 and left frontal cortical in 12/2015. She had stroke first in young age. She denied any DVT, but she had one miscarriage out of 5 pregnancies. She has family hx of DVT.   I will see her in 2 months for follow up and may consider hypercoagulable work up. She will be put on plavix for now and continue lipitor.   Marvel PlanJindong Trajon Rosete, MD PhD Stroke Neurology 01/24/2017 12:16 PM  Meds ordered this encounter  Medications  . clopidogrel (PLAVIX) 75 MG tablet    Sig: Take 1 tablet (75 mg total) by mouth daily.    Dispense:  90 tablet    Refill:  3

## 2017-01-30 ENCOUNTER — Ambulatory Visit (INDEPENDENT_AMBULATORY_CARE_PROVIDER_SITE_OTHER): Payer: BC Managed Care – PPO | Admitting: *Deleted

## 2017-01-30 DIAGNOSIS — I639 Cerebral infarction, unspecified: Secondary | ICD-10-CM | POA: Diagnosis not present

## 2017-01-30 NOTE — Progress Notes (Signed)
Carelink Summary Report / Loop Recorder 

## 2017-02-09 LAB — CUP PACEART REMOTE DEVICE CHECK
Date Time Interrogation Session: 20180702084109
Implantable Pulse Generator Implant Date: 20170606

## 2017-03-01 ENCOUNTER — Ambulatory Visit (INDEPENDENT_AMBULATORY_CARE_PROVIDER_SITE_OTHER): Payer: BC Managed Care – PPO | Admitting: *Deleted

## 2017-03-01 DIAGNOSIS — I639 Cerebral infarction, unspecified: Secondary | ICD-10-CM | POA: Diagnosis not present

## 2017-03-01 NOTE — Progress Notes (Signed)
Carelink Summary Report / Loop Recorder 

## 2017-03-13 LAB — CUP PACEART REMOTE DEVICE CHECK
Date Time Interrogation Session: 20180801134205
Implantable Pulse Generator Implant Date: 20170606

## 2017-03-31 ENCOUNTER — Ambulatory Visit (INDEPENDENT_AMBULATORY_CARE_PROVIDER_SITE_OTHER): Payer: BC Managed Care – PPO | Admitting: *Deleted

## 2017-03-31 DIAGNOSIS — I639 Cerebral infarction, unspecified: Secondary | ICD-10-CM | POA: Diagnosis not present

## 2017-03-31 LAB — CUP PACEART REMOTE DEVICE CHECK
Date Time Interrogation Session: 20180831141002
Implantable Pulse Generator Implant Date: 20170606

## 2017-03-31 NOTE — Progress Notes (Signed)
Carelink summary report received. Battery status OK. Normal device function. No new symptom episodes, tachy episodes, brady, or pause episodes. No new AF episodes. Monthly summary reports and ROV/PRN 

## 2017-03-31 NOTE — Progress Notes (Signed)
Carelink Summary Report / Loop Recorder 

## 2017-04-04 ENCOUNTER — Ambulatory Visit (INDEPENDENT_AMBULATORY_CARE_PROVIDER_SITE_OTHER): Payer: BC Managed Care – PPO | Admitting: Neurology

## 2017-04-04 ENCOUNTER — Encounter: Payer: Self-pay | Admitting: Neurology

## 2017-04-04 VITALS — BP 104/66 | HR 69 | Ht 66.0 in | Wt 276.2 lb

## 2017-04-04 DIAGNOSIS — I10 Essential (primary) hypertension: Secondary | ICD-10-CM

## 2017-04-04 DIAGNOSIS — Z8673 Personal history of transient ischemic attack (TIA), and cerebral infarction without residual deficits: Secondary | ICD-10-CM | POA: Diagnosis not present

## 2017-04-04 DIAGNOSIS — E785 Hyperlipidemia, unspecified: Secondary | ICD-10-CM | POA: Diagnosis not present

## 2017-04-04 DIAGNOSIS — I639 Cerebral infarction, unspecified: Secondary | ICD-10-CM

## 2017-04-04 DIAGNOSIS — Z794 Long term (current) use of insulin: Secondary | ICD-10-CM

## 2017-04-04 DIAGNOSIS — E1165 Type 2 diabetes mellitus with hyperglycemia: Secondary | ICD-10-CM | POA: Diagnosis not present

## 2017-04-04 DIAGNOSIS — I63412 Cerebral infarction due to embolism of left middle cerebral artery: Secondary | ICD-10-CM | POA: Diagnosis not present

## 2017-04-04 NOTE — Progress Notes (Signed)
 STROKE NEUROLOGY FOLLOW UP NOTE  NAME: Catherine Bautista DOB: 12/14/1950  REASON FOR VISIT: stroke follow up HISTORY FROM: pt and chart  Today we had the pleasure of seeing Catherine Bautista in follow-up at our Neurology Clinic. Pt was accompanied by no one.   History Summary Ms. Catherine Bautista is a 66 y.o. female with history of diabetes mellitus, hypertension, hyperlipidemia, previous strokes, obesity, and fibromyalgia was admitted on 01/02/16 for right upper extremity numbness and weakness. MRI showed small left frontal cortical infarct, MRA mild to moderate narrowing of the right ICA. CUS, TTE and DVT negative. TEE showed redundant chordae tendinea, thin mobile density anterior MV could be small ruptured chordae tendinae but not rule out small vegetation. However, pt had no sign of endocarditis. Loop recorder placed. LDL 96 and A1C 9.9. She was discharged with DAPT as she failed plavix PTA. Her lipitor increased from 20 to 40mg. Symptoms resolved and she was discharged home.    History of stroke  2002, left MCA infarct  12/2009, right frontal cortical infarct  No residue  03/22/16 follow-up - the patient has been doing well. No stroke like symptoms. BP 104/58. Glucose fluctuates at home as her lantus changed to levemir. On DAPT. Interested in RESPECT ESUS trial.   Interval History During the interval time, patient has been doing well, no stroke like symptoms. Finished RESPECT ESUS trial and now on plavix. BP today 104/66 and glucose at home in better control with Januvia and Levemir.  REVIEW OF SYSTEMS: Full 14 system review of systems performed and notable only for those listed below and in HPI above, all others are negative:  Constitutional:  fatigue Cardiovascular: swelling in legs Ear/Nose/Throat:  ear pain Skin:  Eyes:   Respiratory:    Gastroitestinal:   abdominal pain Genitourinary:  Hematology/Lymphatic:   anemia Endocrine:  Musculoskeletal:   back pain Allergy/Immunology:     Neurological:  HA, dizziness Psychiatric: Depression Sleep: snoring, apnea, frequent waking  The following represents the patient's updated allergies and side effects list: Allergies  Allergen Reactions  . Demerol Nausea And Vomiting  . Excedrin Extra Strength [Aspirin-Acetaminophen-Caffeine] Other (See Comments)    Weird feeling  . Invokana [Canagliflozin] Other (See Comments)    Decreasing renal function and extremely lethargic  . Janumet [Sitagliptin-Metformin Hcl] Other (See Comments)    Bloating and gas  . Morphine And Related Other (See Comments)    Weird feeling  . Novocain [Procaine Hcl] Nausea And Vomiting    The neurologically relevant items on the patient's problem list were reviewed on today's visit.  Neurologic Examination  A problem focused neurological exam (12 or more points of the single system neurologic examination, vital signs counts as 1 point, cranial nerves count for 8 points) was performed.  Blood pressure 104/66, pulse 69, height 5' 6" (1.676 m), weight 276 lb 3.2 oz (125.3 kg).  General - Well nourished, well developed, in no apparent distress.  Ophthalmologic - Sharp disc margins OU.  Cardiovascular - Regular rate and rhythm.  Mental Status -  Level of arousal and orientation to time, place, and person were intact. Language including expression, naming, repetition, comprehension was assessed and found intact. Fund of Knowledge was assessed and was intact  Cranial Nerves II - XII - II - Visual field intact OU. III, IV, VI - Extraocular movements intact. V - Facial sensation intact bilaterally. VII - Facial movement intact bilaterally VIII - Hearing & vestibular intact bilaterally. X - Palate elevates symmetrically. XI -   Chin turning & shoulder shrug intact bilaterally. XII - Tongue protrusion intact.  Motor Strength - The patient's strength was normal in all extremities and pronator drift was absent.  Bulk was normal and fasciculations were  absent.   Motor Tone - Muscle tone was assessed at the neck and appendages and was normal.  Reflexes - The patient's reflexes were 1+ in all extremities and she had no pathological reflexes.  Sensory - Light touch, temperature/pinprick, vibration and proprioception, and Romberg testing were assessed and were normal.    Coordination - The patient had normal movements in the hands and feet with no ataxia or dysmetria.  Tremor was absent.  Gait and Station - The patient's transfers, posture, gait, station, and turns were observed as normal.   Data reviewed: I personally reviewed the images and agree with the radiology interpretations.  Ct Head Wo Contrast 01/02/2016   1. No acute intracranial pathology seen on CT.  2. Mild cortical volume loss and scattered small vessel ischemic microangiopathy.  3. Chronic infarct at the left parietal and temporal lobes, with associated encephalomalacia.   MRI HEAD  01/02/2016   1. 5 mm subtle focus of diffusion abnormality within the left precentral gyrus. Given the patient's symptoms, finding is suspicious for a possible small acute nonhemorrhagic cortical infarct.  2. No other acute intracranial process.  3. Encephalomalacia within the left temporal occipital region, consistent with remote left MCA territory infarct. Additional remote lacunar left thalamic infarct.  4. Mild age-related cerebral atrophy.   MRA HEAD  01/02/2016   1. No large vessel occlusion within intracranial circulation. No high-grade or correctable stenosis.  2. Atheromatous irregularity within the cavernous ICAs bilaterally with mild to moderate diffuse narrowing on the right.   2-D echocardiogram - Left ventricle: The cavity size was normal. Wall thickness was normal. Systolic function was normal. The estimated ejection fraction was in the range of 60% to 65%. Wall motion was normal; there were no regional wall motion abnormalities. Doppler parameters are consistent with abnormal  left ventricular relaxation (grade 1 diastolic dysfunction).  Carotid Doppler   There is 1-39% bilateral ICA stenosis. Vertebral artery flow is antegrade. '  Lower extremity venous Doppler There is no DVT or SVT noted in the bilateral lower extremities.   TEE Normal LV size and function Normal RV size and function Normal RA Mildly enlarged LA Normal TV with mild TR Normal PV There appears to me redundant chordae tendinae with mild MR. There is a very thin mobile density in the vicinity of the anterior mitral valve that could be a small ruptured chordae tendinae but cannot rule out a small vegetation. Endocarditis is a clinical diagnosis made by positive blood cultures and physical exam findings.  Normal trileaflet AV with mild aortic valve sclerosis but no stenosis and mild AI Markedly positive agitated saline contrast study within 2 cardiac cycles consistent with patent foramen ovale.  Normal thoracic and ascending aorta.  Component     Latest Ref Rng & Units 01/03/2016  Cholesterol     0 - 200 mg/dL 409  Triglycerides     <150 mg/dL 69  HDL Cholesterol     >40 mg/dL 43  Total CHOL/HDL Ratio     RATIO 3.6  VLDL     0 - 40 mg/dL 14  LDL (calc)     0 - 99 mg/dL 96  Hemoglobin W1X     4.8 - 5.6 % 9.9 (H)  Mean Plasma Glucose     mg/dL  237    Assessment: As you may recall, she is a 66 y.o. African American female with PMH of diabetes mellitus, hypertension, hyperlipidemia, previous strokes in left MCA in 2002 and right frontal in 2011, obesity, and fibromyalgia was admitted on 01/02/16 for punctate left frontal cortical infarct, MRA mild to moderate narrowing of the right ICA. CUS, TTE and DVT negative. TEE showed redundant chordae tendinea, thin mobile density anterior MV could be small ruptured chordae tendinae but not rule out small vegetation. However, pt had no sign of endocarditis. TEE also showed positive PFO with significant right to left shunt. Loop recorder placed.  LDL 96 and A1C 9.9. She was discharged with DAPT as she failed plavix PTA. Her lipitor increased from 20 to 40mg . Symptoms resolved and she was discharged home. During the interval time, the patient has been doing well. Glucose fluctuates at home. Enrolled to RESPECT ESUS trial and finished. Now on plavix.   She had left MCA infarct in 2002 in her 2140s, right frontal in 2011 and left frontal cortical in 12/2015. She had stroke first in young age. TEE showed abnormal MV thin mobile density and positive PFO. She denied any DVT, but she had one miscarriage out of 5 pregnancies. She has family hx of DVT. Loop negative for afib. Would like to repeat TEE to monitor MV mobile density and PFO, as well as hypercoagulable workup. However, he does have multiple stroke risk factors, including DM, HTN, HLD, obesity.   Plan:  - continue plavix and lipitor for stroke prevention - will repeat TEE to monitor the mitral valve mobile density and PFO - will do hypercoagulable workup  - Follow up with your primary care physician for stroke risk factor modification. Recommend maintain blood pressure goal <130/80, diabetes with hemoglobin A1c goal below 7.0% and lipids with LDL cholesterol goal below 70 mg/dL.  - check BP and glucose at home and record - continue loop recorder monitoring - diabetic diet and regular exercise  - follow up in 4 months.   I spent more than 25 minutes of face to face time with the patient. Greater than 50% of time was spent in counseling and coordination of care. We discussed repeat TEE and other stroke work up, BP and glucose monitoring at home.   Orders Placed This Encounter  Procedures  . Hypercoagulable panel, comprehensive  . Ambulatory referral to Cardiology    Referral Priority:   Routine    Referral Type:   Consultation    Referral Reason:   Specialty Services Required    Requested Specialty:   Cardiology    Number of Visits Requested:   1    Meds ordered this encounter    Medications  . Melatonin 5 MG TABS    Sig: Take by mouth.    Patient Instructions  - continue plavix and lipitor for stroke prevention - will repeat TEE to monitor the mitral valve mobile density - will draw blood today for evaluation  - Follow up with your primary care physician for stroke risk factor modification. Recommend maintain blood pressure goal <130/80, diabetes with hemoglobin A1c goal below 7.0% and lipids with LDL cholesterol goal below 70 mg/dL.  - check BP and glucose at home and record - continue loop recorder monitoring - diabetic diet and regular exercise  - follow up in 4 months.    Marvel PlanJindong Aleeyah Bensen, MD PhD [facility] [address], [address] [place], [place] [phone]

## 2017-04-04 NOTE — Patient Instructions (Signed)
-   continue plavix and lipitor for stroke prevention - will repeat TEE to monitor the mitral valve mobile density - will draw blood today for evaluation  - Follow up with your primary care physician for stroke risk factor modification. Recommend maintain blood pressure goal <130/80, diabetes with hemoglobin A1c goal below 7.0% and lipids with LDL cholesterol goal below 70 mg/dL.  - check BP and glucose at home and record - continue loop recorder monitoring - diabetic diet and regular exercise  - follow up in 4 months.

## 2017-04-12 ENCOUNTER — Telehealth: Payer: Self-pay | Admitting: *Deleted

## 2017-04-12 ENCOUNTER — Telehealth: Payer: Self-pay

## 2017-04-12 ENCOUNTER — Encounter: Payer: Self-pay | Admitting: *Deleted

## 2017-04-12 NOTE — Telephone Encounter (Signed)
I spoke with Katrina at Dr Al PimpleXu's office--order for TEE under ambulatory referral to cardiology, she will check on pre-cert for TEE and ask their staff to follow-up with Crystal K in our office.

## 2017-04-12 NOTE — Telephone Encounter (Signed)
-----   Message from Danton ClapMelissa A Tatum sent at 04/12/2017  2:32 PM EDT ----- Its in the referrals, no order, that's why I thought she had to be seen.  Melissa ----- Message ----- From: Jacqlyn KraussLankford, Anne M, RN Sent: 04/12/2017   1:20 PM To: Melissa Arsenio LoaderA Tatum, [facility] Triage  Melissa- Where did you find the order for the TEE? We need an order for the TEE to schedule the TEE, also will need pre-cert.  Thanks Thurston HoleAnne ----- Message ----- From: Iona CoachBrown, Lauren W, RN Sent: 04/12/2017  12:20 PM To: Melissa A Clydia Llanoatum, Kathryn A Kemp, RN, #  Hey,  I reviewed this pt's chart and Dr Roda ShuttersXu is only referring the pt for a repeat TEE with one of our docs.  It is not a referral for an office visit.  Arranging a TEE can be done by triage nurse staff.  Thank you, Lauren  ----- Message ----- From: Glynda Jaegeratum, Melissa A Sent: 04/12/2017  11:13 AM To: Iona CoachLauren W Brown, RN  Good Morning!!  This pt has an Epic referral for "repeat TEE to monitor the mitral valve mobile density and PFO". Orpha BurKaty asked that I send it to you, that there may be spots held for these patients?  Thanks, [employer] MillsMelissa

## 2017-04-12 NOTE — Telephone Encounter (Signed)
Rn receive a call from AltadenaAnn at cardiology about the PA. Dewayne Hatchnn stated she can not see that Dr .Roda ShuttersXu put TEE order in the referral. Rn stated its on the bottom.Dewayne Hatchnn also wanted to know if we are going to be doing the PA for the TEE order because our office orders it. Rn stated all referrals are done by Darreld Mcleanana Cox and she will call them back. Rn stated a message will be sent to [facility]. Rn was given number of [phone], so Annabelle HarmanDana can speak with Crystal about the PA for the TEE that are order.

## 2017-04-13 NOTE — Telephone Encounter (Signed)
I have spoke to Crystal and she is aware that I am the contact for Pre - auth . Patient  Has been taking care of.  Approval number [id number] - start date 04/13/2017 oct 12 th 2018 . CPT code [id number]

## 2017-04-17 LAB — HYPERCOAGULABLE PANEL, COMPREHENSIVE
APTT: 24.8 s
AT III Act/Nor PPP Chro: 131 %
Act. Prt C Resist w/FV Defic.: 2.5 ratio
Anticardiolipin Ab, IgG: <10 [GPL'U]
Anticardiolipin Ab, IgM: 11 [MPL'U]
Beta-2 Glycoprotein I, IgA: <10 SAU
Beta-2 Glycoprotein I, IgG: <10 SGU
Beta-2 Glycoprotein I, IgM: <10 SMU
DRVVT Screen Seconds: 34.3 s
Factor VII Antigen**: 180 % — ABNORMAL HIGH
Factor VIII Activity: 245 % — ABNORMAL HIGH
Hexagonal Phospholipid Neutral: 6 s
Homocysteine: 11.5 umol/L
Prot C Ag Act/Nor PPP Imm: 84 %
Prot S Ag Act/Nor PPP Imm: 78 %
Protein C Ag/FVII Ag Ratio**: 0.5 ratio
Protein S Ag/FVII Ag Ratio**: 0.4 ratio — ABNORMAL LOW

## 2017-04-18 ENCOUNTER — Ambulatory Visit (HOSPITAL_COMMUNITY)
Admission: RE | Admit: 2017-04-18 | Discharge: 2017-04-18 | Disposition: A | Discharge status: Home / Self Care | Payer: BC Managed Care – PPO | Source: Ambulatory Visit | Attending: Cardiology | Admitting: Cardiology

## 2017-04-18 ENCOUNTER — Ambulatory Visit (HOSPITAL_BASED_OUTPATIENT_CLINIC_OR_DEPARTMENT_OTHER)
Admission: RE | Admit: 2017-04-18 | Discharge: 2017-04-18 | Disposition: A | Discharge status: Home / Self Care | Payer: BC Managed Care – PPO | Source: Ambulatory Visit | Attending: Cardiology | Admitting: Cardiology

## 2017-04-18 ENCOUNTER — Encounter (HOSPITAL_COMMUNITY)
Admission: RE | Disposition: A | Discharge status: Home / Self Care | Payer: Self-pay | Source: Ambulatory Visit | Attending: Cardiology

## 2017-04-18 ENCOUNTER — Encounter (HOSPITAL_COMMUNITY): Payer: Self-pay

## 2017-04-18 DIAGNOSIS — Z7902 Long term (current) use of antithrombotics/antiplatelets: Secondary | ICD-10-CM | POA: Insufficient documentation

## 2017-04-18 DIAGNOSIS — Z886 Allergy status to analgesic agent status: Secondary | ICD-10-CM | POA: Insufficient documentation

## 2017-04-18 DIAGNOSIS — I77819 Aortic ectasia, unspecified site: Secondary | ICD-10-CM | POA: Insufficient documentation

## 2017-04-18 DIAGNOSIS — Z8249 Family history of ischemic heart disease and other diseases of the circulatory system: Secondary | ICD-10-CM | POA: Insufficient documentation

## 2017-04-18 DIAGNOSIS — I362 Nonrheumatic tricuspid (valve) stenosis with insufficiency: Secondary | ICD-10-CM

## 2017-04-18 DIAGNOSIS — E669 Obesity, unspecified: Secondary | ICD-10-CM | POA: Insufficient documentation

## 2017-04-18 DIAGNOSIS — Z8673 Personal history of transient ischemic attack (TIA), and cerebral infarction without residual deficits: Secondary | ICD-10-CM | POA: Insufficient documentation

## 2017-04-18 DIAGNOSIS — Q211 Atrial septal defect: Secondary | ICD-10-CM | POA: Diagnosis not present

## 2017-04-18 DIAGNOSIS — Z888 Allergy status to other drugs, medicaments and biological substances status: Secondary | ICD-10-CM | POA: Insufficient documentation

## 2017-04-18 DIAGNOSIS — M797 Fibromyalgia: Secondary | ICD-10-CM | POA: Insufficient documentation

## 2017-04-18 DIAGNOSIS — I10 Essential (primary) hypertension: Secondary | ICD-10-CM | POA: Diagnosis not present

## 2017-04-18 DIAGNOSIS — E119 Type 2 diabetes mellitus without complications: Secondary | ICD-10-CM | POA: Diagnosis not present

## 2017-04-18 DIAGNOSIS — I253 Aneurysm of heart: Secondary | ICD-10-CM | POA: Insufficient documentation

## 2017-04-18 DIAGNOSIS — Z794 Long term (current) use of insulin: Secondary | ICD-10-CM | POA: Insufficient documentation

## 2017-04-18 DIAGNOSIS — E785 Hyperlipidemia, unspecified: Secondary | ICD-10-CM | POA: Diagnosis not present

## 2017-04-18 DIAGNOSIS — Z885 Allergy status to narcotic agent status: Secondary | ICD-10-CM | POA: Diagnosis not present

## 2017-04-18 DIAGNOSIS — Z6841 Body Mass Index (BMI) 40.0 and over, adult: Secondary | ICD-10-CM | POA: Insufficient documentation

## 2017-04-18 DIAGNOSIS — I348 Other nonrheumatic mitral valve disorders: Secondary | ICD-10-CM | POA: Diagnosis present

## 2017-04-18 HISTORY — PX: TEE WITHOUT CARDIOVERSION: SHX5443

## 2017-04-18 LAB — GLUCOSE, CAPILLARY: Glucose-Capillary: 128 mg/dL — ABNORMAL HIGH (ref 65–99)

## 2017-04-18 SURGERY — ECHOCARDIOGRAM, TRANSESOPHAGEAL
Anesthesia: Moderate Sedation

## 2017-04-18 MED ORDER — FENTANYL CITRATE (PF) 100 MCG/2ML IJ SOLN
INTRAMUSCULAR | Status: AC
  Filled 2017-04-18: qty 2

## 2017-04-18 MED ORDER — MIDAZOLAM HCL 5 MG/ML IJ SOLN
INTRAMUSCULAR | Status: AC
  Filled 2017-04-18: qty 2

## 2017-04-18 MED ORDER — MIDAZOLAM HCL 10 MG/2ML IJ SOLN
INTRAMUSCULAR | Status: DC | PRN
  Administered 2017-04-18 (×2): 2 mg via INTRAVENOUS
  Administered 2017-04-18: 1 mg via INTRAVENOUS

## 2017-04-18 MED ORDER — SODIUM CHLORIDE 0.9 % IV SOLN
INTRAVENOUS | Status: AC | PRN
  Administered 2017-04-18: 500 mL via INTRAVENOUS

## 2017-04-18 MED ORDER — BUTAMBEN-TETRACAINE-BENZOCAINE 2-2-14 % EX AERO
INHALATION_SPRAY | CUTANEOUS | Status: DC | PRN
  Administered 2017-04-18: 2 via TOPICAL

## 2017-04-18 MED ORDER — FENTANYL CITRATE (PF) 100 MCG/2ML IJ SOLN
INTRAMUSCULAR | Status: DC | PRN
  Administered 2017-04-18 (×2): 25 ug via INTRAVENOUS

## 2017-04-18 NOTE — Discharge Instructions (Signed)

## 2017-04-18 NOTE — Interval H&P Note (Signed)
History and Physical Interval Note:  04/18/2017 8:40 AM  Catherine Bautista  has presented today for surgery, with the diagnosis of STROKE  The various methods of treatment have been discussed with the patient and family. After consideration of risks, benefits and other options for treatment, the patient has consented to  Procedure(s): TRANSESOPHAGEAL ECHOCARDIOGRAM (TEE) (N/A) as a surgical intervention .  The patient's history has been reviewed, patient examined, no change in status, stable for surgery.  I have reviewed the patient's chart and labs.  Questions were answered to the patient's satisfaction.     [name]

## 2017-04-18 NOTE — CV Procedure (Signed)
    Transesophageal Echocardiogram Note  Catherine Bautista [id number] [birth year]  Procedure: Transesophageal Echocardiogram Indications: CVA/Mitral valve abnormality  Procedure Details Consent: Obtained Time Out: Verified patient identification, verified procedure, site/side was marked, verified correct patient position, special equipment/implants available, Radiology Safety Procedures followed,  medications/allergies/relevent history reviewed, required imaging and test results available.  Performed  Medications:  During this procedure the patient is administered a total of Versed 5 mg and Fentanyl 50 mcg  to achieve and maintain moderate conscious sedation.  The patient's heart rate, blood pressure, and oxygen saturation are monitored continuously during the procedure. The period of conscious sedation is 30 minutes, of which I was present face-to-face 100% of this time.  Normal LV function; mildly dilated aortic root; atrial septal aneurysm; positive saline microcavitation study; full report to follow.   Complications: No apparent complications Patient did tolerate procedure well.  [name], MD

## 2017-04-18 NOTE — H&P (View-Only) (Signed)
STROKE NEUROLOGY FOLLOW UP NOTE  NAME: Catherine Bautista DOB: Feb 03, 1951  REASON FOR VISIT: stroke follow up HISTORY FROM: pt and chart  Today we had the pleasure of seeing MERSADEZ LINDEN in follow-up at our Neurology Clinic. Pt was accompanied by no one.   History Summary Ms. JOLETTE LANA is a 66 y.o. female with history of diabetes mellitus, hypertension, hyperlipidemia, previous strokes, obesity, and fibromyalgia was admitted on 01/02/16 for right upper extremity numbness and weakness. MRI showed small left frontal cortical infarct, MRA mild to moderate narrowing of the right ICA. CUS, TTE and DVT negative. TEE showed redundant chordae tendinea, thin mobile density anterior MV could be small ruptured chordae tendinae but not rule out small vegetation. However, pt had no sign of endocarditis. Loop recorder placed. LDL 96 and A1C 9.9. She was discharged with DAPT as she failed plavix PTA. Her lipitor increased from 20 to . Symptoms resolved and she was discharged home.    History of stroke  2002, left MCA infarct  12/2009, right frontal cortical infarct  No residue  03/22/16 follow-up - the patient has been doing well. No stroke like symptoms. BP 104/58. Glucose fluctuates at home as her lantus changed to levemir. On DAPT. Interested in RESPECT ESUS trial.   Interval History During the interval time, patient has been doing well, no stroke like symptoms. Finished RESPECT ESUS trial and now on plavix. BP today 104/66 and glucose at home in better control with Januvia and Levemir.  REVIEW OF SYSTEMS: Full 14 system review of systems performed and notable only for those listed below and in HPI above, all others are negative:  Constitutional:  fatigue Cardiovascular: swelling in legs Ear/Nose/Throat:  ear pain Skin:  Eyes:   Respiratory:    Gastroitestinal:   abdominal pain Genitourinary:  Hematology/Lymphatic:   anemia Endocrine:  Musculoskeletal:   back pain Allergy/Immunology:     Neurological:  HA, dizziness Psychiatric: Depression Sleep: snoring, apnea, frequent waking  The following represents the patient's updated allergies and side effects list: Allergies  Allergen Reactions  . Demerol Nausea And Vomiting  . Excedrin Extra Strength [Aspirin-Acetaminophen-Caffeine] Other (See Comments)    Weird feeling  . Invokana [Canagliflozin] Other (See Comments)    Decreasing renal function and extremely lethargic  . Janumet [Sitagliptin-Metformin Hcl] Other (See Comments)    Bloating and gas  . Morphine And Related Other (See Comments)    Weird feeling  . Novocain [Procaine Hcl] Nausea And Vomiting    The neurologically relevant items on the patient's problem list were reviewed on today's visit.  Neurologic Examination  A problem focused neurological exam (12 or more points of the single system neurologic examination, vital signs counts as 1 point, cranial nerves count for 8 points) was performed.  Blood pressure 104/66, pulse 69, height  (1.676 m), weight 276 lb 3.2 oz (125.3 kg).  General - Well nourished, well developed, in no apparent distress.  Ophthalmologic - Sharp disc margins OU.  Cardiovascular - Regular rate and rhythm.  Mental Status -  Level of arousal and orientation to time, place, and person were intact. Language including expression, naming, repetition, comprehension was assessed and found intact. Fund of Knowledge was assessed and was intact  Cranial Nerves II - XII - II - Visual field intact OU. III, IV, VI - Extraocular movements intact. V - Facial sensation intact bilaterally. VII - Facial movement intact bilaterally VIII - Hearing & vestibular intact bilaterally. X - Palate elevates symmetrically. XI -  Chin turning & shoulder shrug intact bilaterally. XII - Tongue protrusion intact.  Motor Strength - The patient's strength was normal in all extremities and pronator drift was absent.  Bulk was normal and fasciculations were  absent.   Motor Tone - Muscle tone was assessed at the neck and appendages and was normal.  Reflexes - The patient's reflexes were 1+ in all extremities and she had no pathological reflexes.  Sensory - Light touch, temperature/pinprick, vibration and proprioception, and Romberg testing were assessed and were normal.    Coordination - The patient had normal movements in the hands and feet with no ataxia or dysmetria.  Tremor was absent.  Gait and Station - The patient's transfers, posture, gait, station, and turns were observed as normal.   Data reviewed: I personally reviewed the images and agree with the radiology interpretations.  Ct Head Wo Contrast 01/02/2016   1. No acute intracranial pathology seen on CT.  2. Mild cortical volume loss and scattered small vessel ischemic microangiopathy.  3. Chronic infarct at the left parietal and temporal lobes, with associated encephalomalacia.   MRI HEAD  01/02/2016   1. 5 mm subtle focus of diffusion abnormality within the left precentral gyrus. Given the patient's symptoms, finding is suspicious for a possible small acute nonhemorrhagic cortical infarct.  2. No other acute intracranial process.  3. Encephalomalacia within the left temporal occipital region, consistent with remote left MCA territory infarct. Additional remote lacunar left thalamic infarct.  4. Mild age-related cerebral atrophy.   MRA HEAD  01/02/2016   1. No large vessel occlusion within intracranial circulation. No high-grade or correctable stenosis.  2. Atheromatous irregularity within the cavernous ICAs bilaterally with mild to moderate diffuse narrowing on the right.   2-D echocardiogram - Left ventricle: The cavity size was normal. Wall thickness was normal. Systolic function was normal. The estimated ejection fraction was in the range of 60% to 65%. Wall motion was normal; there were no regional wall motion abnormalities. Doppler parameters are consistent with abnormal  left ventricular relaxation (grade 1 diastolic dysfunction).  Carotid Doppler   There is 1-39% bilateral ICA stenosis. Vertebral artery flow is antegrade. '  Lower extremity venous Doppler There is no DVT or SVT noted in the bilateral lower extremities.   TEE Normal LV size and function Normal RV size and function Normal RA Mildly enlarged LA Normal TV with mild TR Normal PV There appears to me redundant chordae tendinae with mild MR. There is a very thin mobile density in the vicinity of the anterior mitral valve that could be a small ruptured chordae tendinae but cannot rule out a small vegetation. Endocarditis is a clinical diagnosis made by positive blood cultures and physical exam findings.  Normal trileaflet AV with mild aortic valve sclerosis but no stenosis and mild AI Markedly positive agitated saline contrast study within 2 cardiac cycles consistent with patent foramen ovale.  Normal thoracic and ascending aorta.  Component     Latest Ref Rng & Units 01/03/2016  Cholesterol     0 - 200 mg/dL 409  Triglycerides     <150 mg/dL 69  HDL Cholesterol     >40 mg/dL 43  Total CHOL/HDL Ratio     RATIO 3.6  VLDL     0 - 40 mg/dL 14  LDL (calc)     0 - 99 mg/dL 96  Hemoglobin W1X     4.8 - 5.6 % 9.9 (H)  Mean Plasma Glucose     mg/dL  237    Assessment: As you may recall, she is a 66 y.o. African American female with PMH of diabetes mellitus, hypertension, hyperlipidemia, previous strokes in left MCA in 2002 and right frontal in 2011, obesity, and fibromyalgia was admitted on 01/02/16 for punctate left frontal cortical infarct, MRA mild to moderate narrowing of the right ICA. CUS, TTE and DVT negative. TEE showed redundant chordae tendinea, thin mobile density anterior MV could be small ruptured chordae tendinae but not rule out small vegetation. However, pt had no sign of endocarditis. TEE also showed positive PFO with significant right to left shunt. Loop recorder placed.  LDL 96 and A1C 9.9. She was discharged with DAPT as she failed plavix PTA. Her lipitor increased from 20 to 40mg . Symptoms resolved and she was discharged home. During the interval time, the patient has been doing well. Glucose fluctuates at home. Enrolled to RESPECT ESUS trial and finished. Now on plavix.   She had left MCA infarct in 2002 in her 2140s, right frontal in 2011 and left frontal cortical in 12/2015. She had stroke first in young age. TEE showed abnormal MV thin mobile density and positive PFO. She denied any DVT, but she had one miscarriage out of 5 pregnancies. She has family hx of DVT. Loop negative for afib. Would like to repeat TEE to monitor MV mobile density and PFO, as well as hypercoagulable workup. However, he does have multiple stroke risk factors, including DM, HTN, HLD, obesity.   Plan:  - continue plavix and lipitor for stroke prevention - will repeat TEE to monitor the mitral valve mobile density and PFO - will do hypercoagulable workup  - Follow up with your primary care physician for stroke risk factor modification. Recommend maintain blood pressure goal <130/80, diabetes with hemoglobin A1c goal below 7.0% and lipids with LDL cholesterol goal below 70 mg/dL.  - check BP and glucose at home and record - continue loop recorder monitoring - diabetic diet and regular exercise  - follow up in 4 months.   I spent more than 25 minutes of face to face time with the patient. Greater than 50% of time was spent in counseling and coordination of care. We discussed repeat TEE and other stroke work up, BP and glucose monitoring at home.   Orders Placed This Encounter  Procedures  . Hypercoagulable panel, comprehensive  . Ambulatory referral to Cardiology    Referral Priority:   Routine    Referral Type:   Consultation    Referral Reason:   Specialty Services Required    Requested Specialty:   Cardiology    Number of Visits Requested:   1    Meds ordered this encounter    Medications  . Melatonin 5 MG TABS    Sig: Take by mouth.    Patient Instructions  - continue plavix and lipitor for stroke prevention - will repeat TEE to monitor the mitral valve mobile density - will draw blood today for evaluation  - Follow up with your primary care physician for stroke risk factor modification. Recommend maintain blood pressure goal <130/80, diabetes with hemoglobin A1c goal below 7.0% and lipids with LDL cholesterol goal below 70 mg/dL.  - check BP and glucose at home and record - continue loop recorder monitoring - diabetic diet and regular exercise  - follow up in 4 months.    Marvel PlanJindong Nevin Kozuch, MD PhD [facility] [address], [address] [place], [place] [phone]

## 2017-04-18 NOTE — H&P (Signed)
Office Visit   04/04/2017 [facility]  Marvel Plan, MD  Neurology   Acute CVA (cerebrovascular accident) [facility]) +5 more  Dx   Follow-up ; Referred by Renford Dills, MD  Reason for Visit   Additional Documentation   Vitals:   BP 104/66 (BP Location: Right Arm, Patient Position: Sitting, Cuff Size: Normal)   Pulse 69   Ht  (1.676 m)   Wt 125.3 kg (276 lb 3.2 oz)   BMI 44.58 kg/m   BSA 2.42 m      More Vitals   Flowsheets:   Fall & Depression Screening,   Custom Formula Data,   Anthropometrics,   MEWS Score     Encounter Info:   Billing Info,   History,   Allergies,   Detailed Report     All Notes   Progress Notes by Marvel Plan, MD at 04/04/2017 10:30 AM   Author: Marvel Plan, MD Author Type: Physician Filed: 04/04/2017 10:34 PM  Note Status: Signed Cosign: Cosign Not Required Encounter Date: 04/04/2017  Editor: Marvel Plan, MD (Physician)      STROKE NEUROLOGY FOLLOW UP NOTE  NAME: GLORIE DOWLEN DOB: 23-Jul-1951  REASON FOR VISIT: stroke follow up HISTORY FROM: pt and chart  Today we had the pleasure of seeing MELVIN MARMO in follow-up at our Neurology Clinic. Pt was accompanied by no one.   History Summary Ms.SHALUNDA LINDH a 66 y.o.femalewith history of diabetes mellitus, hypertension, hyperlipidemia, previous strokes, obesity, and fibromyalgia was admitted on 01/02/16 for right upper extremity numbness and weakness. MRI showed small left frontal cortical infarct, MRA mild to moderate narrowing of the right ICA. CUS, TTE and DVT negative. TEE showed redundant chordae tendinea, thin mobile density anterior MV could be small ruptured chordae tendinae but not rule out small vegetation. However, pt had no sign of endocarditis. Loop recorder placed. LDL 96 and A1C 9.9. She was discharged with DAPT as she failed plavix PTA. Her lipitor increased from 20 to . Symptoms resolved and she was discharged home.    History of stroke  2002, left MCA  infarct  12/2009, right frontal cortical infarct  No residue  03/22/16 follow-up - the patient has been doing well. No stroke like symptoms. BP 104/58. Glucose fluctuates at home as her lantus changed to levemir. On DAPT. Interested in RESPECT ESUS trial.   Interval History During the interval time, patient has been doing well, no stroke like symptoms. Finished RESPECT ESUS trial and now on plavix. BP today 104/66 and glucose at home in better control with Januvia and Levemir.  REVIEW OF SYSTEMS: Full 14 system review of systems performed and notable only for those listed below and in HPI above, all others are negative:  Constitutional:  fatigue Cardiovascular: swelling in legs Ear/Nose/Throat:  ear pain Skin:  Eyes:   Respiratory:    Gastroitestinal:   abdominal pain Genitourinary:  Hematology/Lymphatic:   anemia Endocrine:  Musculoskeletal:   back pain Allergy/Immunology:   Neurological:  HA, dizziness Psychiatric: Depression Sleep: snoring, apnea, frequent waking  The following represents the patient's updated allergies and side effects list:      Allergies  Allergen Reactions  . Demerol Nausea And Vomiting  . Excedrin Extra Strength [Aspirin-Acetaminophen-Caffeine] Other (See Comments)    Weird feeling  . Invokana [Canagliflozin] Other (See Comments)    Decreasing renal function and extremely lethargic  . Janumet [Sitagliptin-Metformin Hcl] Other (See Comments)    Bloating and gas  . Morphine And Related Other (  See Comments)    Weird feeling  . Novocain [Procaine Hcl] Nausea And Vomiting    The neurologically relevant items on the patient's problem list were reviewed on today's visit.  Neurologic Examination  A problem focused neurological exam (12 or more points of the single system neurologic examination, vital signs counts as 1 point, cranial nerves count for 8 points) was performed.  Blood pressure 104/66, pulse 69, height  (1.676 m),  weight 276 lb 3.2 oz (125.3 kg).  General - Well nourished, well developed, in no apparent distress.  Ophthalmologic - Sharp disc margins OU.  Cardiovascular - Regular rate and rhythm.  Mental Status -  Level of arousal and orientation to time, place, and person were intact. Language including expression, naming, repetition, comprehension was assessed and found intact. Fund of Knowledge was assessed and was intact  Cranial Nerves II - XII - II - Visual field intact OU. III, IV, VI - Extraocular movements intact. V - Facial sensation intact bilaterally. VII - Facial movement intact bilaterally VIII - Hearing & vestibular intact bilaterally. X - Palate elevates symmetrically. XI - Chin turning & shoulder shrug intact bilaterally. XII - Tongue protrusion intact.  Motor Strength - The patient's strength was normal in all extremities and pronator drift was absent.  Bulk was normal and fasciculations were absent.   Motor Tone - Muscle tone was assessed at the neck and appendages and was normal.  Reflexes - The patient's reflexes were 1+ in all extremities and she had no pathological reflexes.  Sensory - Light touch, temperature/pinprick, vibration and proprioception, and Romberg testing were assessed and were normal.    Coordination - The patient had normal movements in the hands and feet with no ataxia or dysmetria.  Tremor was absent.  Gait and Station - The patient's transfers, posture, gait, station, and turns were observed as normal.   Data reviewed: I personally reviewed the images and agree with the radiology interpretations.  Ct Head Wo Contrast 01/02/2016  1. No acute intracranial pathology seen on CT.  2. Mild cortical volume loss and scattered small vessel ischemic microangiopathy.  3. Chronic infarct at the left parietal and temporal lobes, with associated encephalomalacia.   MRI HEAD  01/02/2016  1. 5 mm subtle focus of diffusion abnormality within the  left precentral gyrus. Given the patient's symptoms, finding is suspicious for a possible small acute nonhemorrhagic cortical infarct.  2. No other acute intracranial process.  3. Encephalomalacia within the left temporal occipital region, consistent with remote left MCA territory infarct. Additional remote lacunar left thalamic infarct.  4. Mild age-related cerebral atrophy.   MRA HEAD  01/02/2016  1. No large vessel occlusion within intracranial circulation. No high-grade or correctable stenosis.  2. Atheromatous irregularity within the cavernous ICAs bilaterally with mild to moderate diffuse narrowing on the right.   2-D echocardiogram - Left ventricle: The cavity size was normal. Wall thickness was normal. Systolic function was normal. The estimated ejection fraction was in the range of 60% to 65%. Wall motion was normal; there were no regional wall motion abnormalities. Doppler parameters are consistent with abnormal left ventricular relaxation (grade 1 diastolic dysfunction).  Carotid Doppler  There is 1-39% bilateral ICA stenosis. Vertebral artery flow is antegrade. '  Lower extremity venous Doppler There is no DVT or SVT noted in the bilateral lower extremities.   TEE Normal LV size and function Normal RV size and function Normal RA Mildly enlarged LA Normal TV with mild TR Normal PV There  appears to me redundant chordae tendinae with mild MR. There is a very thin mobile density in the vicinity of the anterior mitral valve that could be a small ruptured chordae tendinae but cannot rule out a small vegetation. Endocarditis is a clinical diagnosis made by positive blood cultures and physical exam findings.  Normal trileaflet AV with mild aortic valve sclerosis but no stenosis and mild AI Markedly positive agitated saline contrast study within 2 cardiac cycles consistent with patent foramen ovale.  Normal thoracic and ascending aorta.  Component     Latest Ref Rng &  Units 01/03/2016  Cholesterol     0 - 200 mg/dL 161  Triglycerides     <150 mg/dL 69  HDL Cholesterol     >40 mg/dL 43  Total CHOL/HDL Ratio     RATIO 3.6  VLDL     0 - 40 mg/dL 14  LDL (calc)     0 - 99 mg/dL 96  Hemoglobin W9U     4.8 - 5.6 % 9.9 (H)  Mean Plasma Glucose     mg/dL 045    Assessment: As you may recall, she is a 66 y.o. African American female with PMH of diabetes mellitus, hypertension, hyperlipidemia, previous strokes in left MCA in 2002 and right frontal in 2011, obesity, and fibromyalgia was admitted on 01/02/16 for punctate left frontal cortical infarct, MRA mild to moderate narrowing of the right ICA. CUS, TTE and DVT negative. TEE showed redundant chordae tendinea, thin mobile density anterior MV could be small ruptured chordae tendinae but not rule out small vegetation. However, pt had no sign of endocarditis. TEE also showed positive PFO with significant right to left shunt. Loop recorder placed. LDL 96 and A1C 9.9. She was discharged with DAPT as she failed plavix PTA. Her lipitor increased from 20 to . Symptoms resolved and she was discharged home. During the interval time, the patient has been doing well. Glucose fluctuates at home. Enrolled to RESPECT ESUS trial and finished. Now on plavix.   She had left MCA infarct in 2002 in her 72s, right frontal in 2011 and left frontal cortical in 12/2015. She had stroke first in young age. TEE showed abnormal MV thin mobile density and positive PFO. She denied any DVT, but she had one miscarriage out of 5 pregnancies. She has family hx of DVT. Loop negative for afib. Would like to repeat TEE to monitor MV mobile density and PFO, as well as hypercoagulable workup. However, he does have multiple stroke risk factors, including DM, HTN, HLD, obesity.   Plan:  - continue plavix and lipitor for stroke prevention - will repeat TEE to monitor the mitral valve mobile density and PFO - will do hypercoagulable workup  - Follow  up with your primary care physician for stroke risk factor modification. Recommend maintain blood pressure goal <130/80, diabetes with hemoglobin A1c goal below 7.0% and lipids with LDL cholesterol goal below 70 mg/dL.  - check BP and glucose at home and record - continue loop recorder monitoring - diabetic diet and regular exercise  - follow up in 4 months.   I spent more than 25 minutes of face to face time with the patient. Greater than 50% of time was spent in counseling and coordination of care. We discussed repeat TEE and other stroke work up, BP and glucose monitoring at home.        Orders Placed This Encounter  Procedures  . Hypercoagulable panel, comprehensive  . Ambulatory referral to  Cardiology    Referral Priority:   Routine    Referral Type:   Consultation    Referral Reason:   Specialty Services Required    Requested Specialty:   Cardiology    Number of Visits Requested:   1        Meds ordered this encounter  Medications  . Melatonin 5 MG TABS    Sig: Take by mouth.    Patient Instructions  - continue plavix and lipitor for stroke prevention - will repeat TEE to monitor the mitral valve mobile density - will draw blood today for evaluation  - Follow up with your primary care physician for stroke risk factor modification. Recommend maintain blood pressure goal <130/80, diabetes with hemoglobin A1c goal below 7.0% and lipids with LDL cholesterol goal below 70 mg/dL.  - check BP and glucose at home and record - continue loop recorder monitoring - diabetic diet and regular exercise  - follow up in 4 months.    Marvel Plan, MD PhD [facility] [address] [place], [place] [address] [phone]     Patient Instructions by Marvel Plan, MD at 04/04/2017 10:30 AM   Author: Marvel Plan, MD Author Type: Physician Filed: 04/04/2017 11:23 AM  Note Status: Signed Cosign: Cosign Not Required Encounter Date: 04/04/2017    Editor: Marvel Plan, MD (Physician)    - continue plavix and lipitor for stroke prevention - will repeat TEE to monitor the mitral valve mobile density - will draw blood today for evaluation  - Follow up with your primary care physician for stroke risk factor modification. Recommend maintain blood pressure goal <130/80, diabetes with hemoglobin A1c goal below 7.0% and lipids with LDL cholesterol goal below 70 mg/dL.  - check BP and glucose at home and record - continue loop recorder monitoring - diabetic diet and regular exercise  - follow up in 4 months.      For TEE; no changes [name]

## 2017-04-19 ENCOUNTER — Encounter (HOSPITAL_COMMUNITY): Payer: Self-pay | Admitting: Cardiology

## 2017-05-01 ENCOUNTER — Ambulatory Visit (INDEPENDENT_AMBULATORY_CARE_PROVIDER_SITE_OTHER): Payer: BC Managed Care – PPO | Admitting: *Deleted

## 2017-05-01 DIAGNOSIS — I639 Cerebral infarction, unspecified: Secondary | ICD-10-CM | POA: Diagnosis not present

## 2017-05-02 NOTE — Progress Notes (Signed)
Carelink Summary Report / Loop Recorder 

## 2017-05-04 LAB — CUP PACEART REMOTE DEVICE CHECK
Date Time Interrogation Session: 20180930173927
Implantable Pulse Generator Implant Date: 20170606

## 2017-05-16 ENCOUNTER — Other Ambulatory Visit (HOSPITAL_COMMUNITY): Payer: Self-pay | Admitting: Internal Medicine

## 2017-05-16 DIAGNOSIS — E0590 Thyrotoxicosis, unspecified without thyrotoxic crisis or storm: Secondary | ICD-10-CM

## 2017-05-30 ENCOUNTER — Ambulatory Visit (INDEPENDENT_AMBULATORY_CARE_PROVIDER_SITE_OTHER): Payer: BC Managed Care – PPO | Admitting: *Deleted

## 2017-05-30 DIAGNOSIS — I639 Cerebral infarction, unspecified: Secondary | ICD-10-CM

## 2017-05-30 NOTE — Progress Notes (Signed)
Carelink Summary Report / Loop Recorder 

## 2017-06-01 ENCOUNTER — Ambulatory Visit (HOSPITAL_COMMUNITY)
Admission: RE | Admit: 2017-06-01 | Discharge: 2017-06-01 | Disposition: A | Discharge status: Home / Self Care | Payer: BC Managed Care – PPO | Source: Ambulatory Visit | Attending: Internal Medicine | Admitting: Internal Medicine

## 2017-06-01 DIAGNOSIS — E0590 Thyrotoxicosis, unspecified without thyrotoxic crisis or storm: Secondary | ICD-10-CM | POA: Insufficient documentation

## 2017-06-01 MED ORDER — SODIUM IODIDE I 131 CAPSULE
10.0000 | Freq: Once | INTRAVENOUS | Status: AC | PRN
Start: 2017-06-01 — End: 2017-06-01
  Administered 2017-06-01: 10 via ORAL

## 2017-06-02 ENCOUNTER — Encounter (HOSPITAL_COMMUNITY)
Admission: RE | Admit: 2017-06-02 | Discharge: 2017-06-02 | Disposition: A | Discharge status: Home / Self Care | Payer: BC Managed Care – PPO | Source: Ambulatory Visit | Attending: Internal Medicine | Admitting: Internal Medicine

## 2017-06-02 DIAGNOSIS — E0590 Thyrotoxicosis, unspecified without thyrotoxic crisis or storm: Secondary | ICD-10-CM | POA: Diagnosis not present

## 2017-06-02 LAB — CUP PACEART REMOTE DEVICE CHECK
Date Time Interrogation Session: 20181030173927
Implantable Pulse Generator Implant Date: 20170606

## 2017-06-02 MED ORDER — SODIUM PERTECHNETATE TC 99M INJECTION
10.8000 | Freq: Once | INTRAVENOUS | Status: AC | PRN
  Administered 2017-06-02: 10.8 via INTRAVENOUS

## 2017-06-08 DIAGNOSIS — E0590 Thyrotoxicosis, unspecified without thyrotoxic crisis or storm: Secondary | ICD-10-CM | POA: Insufficient documentation

## 2017-06-29 ENCOUNTER — Ambulatory Visit (INDEPENDENT_AMBULATORY_CARE_PROVIDER_SITE_OTHER): Payer: BC Managed Care – PPO | Admitting: *Deleted

## 2017-06-29 DIAGNOSIS — I639 Cerebral infarction, unspecified: Secondary | ICD-10-CM

## 2017-06-30 NOTE — Progress Notes (Signed)
Carelink Summary Report / Loop Recorder 

## 2017-07-11 LAB — CUP PACEART REMOTE DEVICE CHECK
Date Time Interrogation Session: 20181129174038
Implantable Pulse Generator Implant Date: 20170606

## 2017-07-31 ENCOUNTER — Ambulatory Visit (INDEPENDENT_AMBULATORY_CARE_PROVIDER_SITE_OTHER): Payer: BC Managed Care – PPO | Admitting: *Deleted

## 2017-07-31 DIAGNOSIS — I639 Cerebral infarction, unspecified: Secondary | ICD-10-CM

## 2017-08-02 NOTE — Progress Notes (Signed)
Carelink Summary Report / Loop Recorder 

## 2017-08-08 ENCOUNTER — Ambulatory Visit: Payer: BC Managed Care – PPO | Admitting: Neurology

## 2017-08-08 ENCOUNTER — Encounter: Payer: Self-pay | Admitting: Neurology

## 2017-08-08 VITALS — BP 138/70 | HR 71 | Wt 271.0 lb

## 2017-08-08 DIAGNOSIS — E1165 Type 2 diabetes mellitus with hyperglycemia: Secondary | ICD-10-CM

## 2017-08-08 DIAGNOSIS — I10 Essential (primary) hypertension: Secondary | ICD-10-CM

## 2017-08-08 DIAGNOSIS — I6340 Cerebral infarction due to embolism of unspecified cerebral artery: Secondary | ICD-10-CM | POA: Diagnosis not present

## 2017-08-08 DIAGNOSIS — Z794 Long term (current) use of insulin: Secondary | ICD-10-CM

## 2017-08-08 NOTE — Progress Notes (Addendum)
STROKE NEUROLOGY FOLLOW UP NOTE  NAME: Catherine Bautista DOB: 1951-04-28  REASON FOR VISIT: stroke follow up HISTORY FROM: pt and chart  Today we had Catherine pleasure of seeing Catherine Bautista in follow-up at our [facility]. Pt was accompanied by no one.   History Summary Catherine Bautista is a 67 y.o. female with history of diabetes mellitus, hypertension, hyperlipidemia, previous strokes, obesity, and fibromyalgia was admitted on 01/02/16 for right upper extremity numbness and weakness. MRI showed small left frontal cortical infarct, MRA mild to moderate narrowing of Catherine right ICA. CUS, TTE and DVT negative. TEE showed redundant chordae tendinea, thin mobile density anterior MV could be small ruptured chordae tendinae but not rule out small vegetation. However, pt had no sign of endocarditis. Loop recorder placed. LDL 96 and A1C 9.9. Catherine Bautista was discharged with DAPT as Catherine Bautista failed plavix PTA. Her lipitor increased from 20 to 40mg . Symptoms resolved and Catherine Bautista was discharged home.    History of stroke  2002, left MCA infarct  12/2009, right frontal cortical infarct  No residue  03/22/16 follow-up - Catherine Bautista has been doing well. No stroke like symptoms. BP 104/58. Glucose fluctuates at home as her lantus changed to levemir. On DAPT. Interested in RESPECT ESUS trial.   04/04/17 follow-up - During Catherine interval time, Bautista has been doing well, no stroke like symptoms. Finished RESPECT ESUS trial and now on plavix. BP today 104/66 and glucose at home in better control with Januvia and Levemir.  Interval review: Overall doing well. No complaints at todays visit. States A1c currently 7.1 which Bautista is happy with as it was previously >9. Pt states Catherine Bautista has been eating healthy and taking her medications as prescribed. LDL in good limits per Bautista but unable to state exact number. Continues to take Lipitor without side effects. BP today 138/70 which per Bautista is high, at home usually SBP 110-120's. Continues to  take Plavix without side effects. Loop recorder w/o AF episodes. Continues to wear CPAP for sleep apnea. No new CVA/TIA symptoms.   REVIEW OF SYSTEMS: Full 14 system review of systems performed and notable only for those listed below and in HPI above, all others are negative:  Constitutional:   Cardiovascular:  Ear/Nose/Throat:   Skin:  Eyes:   Respiratory:    Gastroitestinal:  Genitourinary:  Hematology/Lymphatic:  Endocrine:  Musculoskeletal:    Allergy/Immunology:   Neurological:  Psychiatric:  Sleep:   Catherine following represents Catherine Bautista's updated allergies and side effects list: Allergies  Allergen Reactions  . Demerol Nausea And Vomiting  . Excedrin Extra Strength [Aspirin-Acetaminophen-Caffeine] Other (See Comments)    Weird feeling  . Invokana [Canagliflozin] Other (See Comments)    Decreasing renal function and extremely lethargic  . Janumet [Sitagliptin-Metformin Hcl] Other (See Comments)    Bloating and gas  . Meperidine Other (See Comments)  . Morphine And Related Other (See Comments)    Weird feeling  . Novocain [Procaine Hcl] Nausea And Vomiting    Per pt "I never had a problem with lidocaine, just novocain."  . Procaine Other (See Comments)    Catherine neurologically relevant items on Catherine Bautista's problem list were reviewed on today's visit.  Neurologic Examination  A problem focused neurological exam (12 or more points of Catherine single system neurologic examination, vital signs counts as 1 point, cranial nerves count for 8 points) was performed.  Blood pressure 138/70, pulse 71, weight 271 lb (122.9 kg).  General - Well nourished, well developed,  in no apparent distress.  Ophthalmologic - Sharp disc margins OU.  Cardiovascular - Regular rate and rhythm.  Mental Status -  Level of arousal and orientation to time, place, and person were intact. Language including expression, naming, repetition, comprehension was assessed and found intact. Fund of  Knowledge was assessed and was intact  Cranial Nerves II - XII - II - Visual field intact OU. III, IV, VI - Extraocular movements intact. V - Facial sensation intact bilaterally. VII - Facial movement intact bilaterally VIII - Hearing & vestibular intact bilaterally. X - Palate elevates symmetrically. XI - Chin turning & shoulder shrug intact bilaterally. XII - Tongue protrusion intact.  Motor Strength - Catherine Bautista's strength was normal in all extremities and pronator drift was absent.  Bulk was normal and fasciculations were absent.   Motor Tone - Muscle tone was assessed at Catherine neck and appendages and was normal.  Reflexes - Catherine Bautista's reflexes were 1+ in all extremities and Catherine Bautista had no pathological reflexes.  Sensory - Light touch, temperature/pinprick, vibration and proprioception, and Romberg testing were assessed and were normal.    Coordination - Catherine Bautista had normal movements in Catherine hands and feet with no ataxia or dysmetria.  Tremor was absent.  Gait and Station - Catherine Bautista's transfers, posture, gait, station, and turns were observed as normal.   Data reviewed: I personally reviewed Catherine images and agree with Catherine radiology interpretations.  Ct Head Wo Contrast 01/02/2016   1. No acute intracranial pathology seen on CT.  2. Mild cortical volume loss and scattered small vessel ischemic microangiopathy.  3. Chronic infarct at Catherine left parietal and temporal lobes, with associated encephalomalacia.   MRI HEAD  01/02/2016   1. 5 mm subtle focus of diffusion abnormality within Catherine left precentral gyrus. Given Catherine Bautista's symptoms, finding is suspicious for a possible small acute nonhemorrhagic cortical infarct.  2. No other acute intracranial process.  3. Encephalomalacia within Catherine left temporal occipital region, consistent with remote left MCA territory infarct. Additional remote lacunar left thalamic infarct.  4. Mild age-related cerebral atrophy.   MRA HEAD    01/02/2016   1. No large vessel occlusion within intracranial circulation. No high-grade or correctable stenosis.  2. Atheromatous irregularity within Catherine cavernous ICAs bilaterally with mild to moderate diffuse narrowing on Catherine right.   2-D echocardiogram - Left ventricle: Catherine cavity size was normal. Wall thickness was normal. Systolic function was normal. Catherine estimated ejection fraction was in Catherine range of 60% to 65%. Wall motion was normal; there were no regional wall motion abnormalities. Doppler parameters are consistent with abnormal left ventricular relaxation (grade 1 diastolic dysfunction).  Carotid Doppler   There is 1-39% bilateral ICA stenosis. Vertebral artery flow is antegrade. '  Lower extremity venous Doppler There is no DVT or SVT noted in Catherine bilateral lower extremities.   TEE 01/05/16 Normal LV size and function Normal RV size and function Normal RA Mildly enlarged LA Normal TV with mild TR Normal PV There appears to me redundant chordae tendinae with mild MR. There is a very thin mobile density in Catherine vicinity of Catherine anterior mitral valve that could be a small ruptured chordae tendinae but cannot rule out a small vegetation. Endocarditis is a clinical diagnosis made by positive blood cultures and physical exam findings.  Normal trileaflet AV with mild aortic valve sclerosis but no stenosis and mild AI Markedly positive agitated saline contrast study within 2 cardiac cycles consistent with patent foramen ovale.  Normal thoracic and ascending aorta.  TEE 04/18/17 - Normal LV function; atrial septal aneurysm; redundant MV chordae; positive saline microcavitation study consistent with PFO.  Component     Latest Ref Rng & Units 01/03/2016  Cholesterol     0 - 200 mg/dL 161  Triglycerides     <150 mg/dL 69  HDL Cholesterol     >40 mg/dL 43  Total CHOL/HDL Ratio     RATIO 3.6  VLDL     0 - 40 mg/dL 14  LDL (calc)     0 - 99 mg/dL 96  Hemoglobin W9U      4.8 - 5.6 % 9.9 (H)  Mean Plasma Glucose     mg/dL 045    Assessment: As you may recall, Catherine Bautista is a 67 y.o. African American female with PMH of diabetes mellitus, hypertension, hyperlipidemia, previous strokes in left MCA in 2002 and right frontal in 2011, obesity, and fibromyalgia was admitted on 01/02/16 for punctate left frontal cortical infarct, MRA mild to moderate narrowing of Catherine right ICA. CUS, TTE and DVT negative. TEE showed redundant chordae tendinea, thin mobile density anterior MV could be small ruptured chordae tendinae but not rule out small vegetation. However, pt had no sign of endocarditis. TEE also showed positive PFO with significant right to left shunt. Loop recorder placed. LDL 96 and A1C 9.9. Catherine Bautista was discharged with DAPT as Catherine Bautista failed plavix PTA. Her lipitor increased from 20 to 40mg . Symptoms resolved and Catherine Bautista was discharged home. During Catherine interval time, Catherine Bautista has been doing well. Glucose fluctuates at home. Enrolled to RESPECT ESUS trial and finished. Now on plavix.   Catherine Bautista had left MCA infarct in 2002 in her 15s, right frontal in 2011 and left frontal cortical in 12/2015. Catherine Bautista had stroke first in young age. TEE showed abnormal MV thin mobile density and positive PFO. Catherine Bautista denied any DVT, but Catherine Bautista had one miscarriage out of 5 pregnancies. Catherine Bautista has family hx of DVT. Loop negative for afib. Repeat TEE showed no more MV mobile density but still has ASA and PFO.  Hypercoagulable panel on 04/04/17 shows increased Factor VIII at 245, as well as slightly elevated factor VII antigen. Will repeat labs and consider sending Bautista to hematology if repeat level remains increased.  However, he does have multiple stroke risk factors, including DM, HTN, HLD, obesity.   Plan:  - repeat Factor VII and VIII labs - consider sending Bautista to hematologist if levels remain high - continue plavix and lipitor for stroke prevention - Follow up with your primary care physician for stroke risk factor  modification. Recommend maintain blood pressure goal <130/80, diabetes with hemoglobin A1c goal below 7.0% and lipids with LDL cholesterol goal below 70 mg/dL.  - check BP and glucose at home and record - continue loop recorder monitoring - diabetic diet and regular exercise  - follow up in 6 months  I spent more than 25 minutes of face to face time with Catherine Bautista. Greater than 50% of time was spent in counseling and coordination of care. We discussed about repeating hematology panel and consider hematology consult if needed.   ADDENDUM: Pt factor VII and VIII activity result back showed normalized on factor VII level and only mildly elevated factor VIII activity and much improved from prior. Does not need hematology referral this time. Will recommend to repeat factor VIII again at next visit.   Marvel Plan, MD PhD Stroke Neurology 08/09/2017 5:03 PM   Orders Placed This  Encounter  Procedures  . Factor 8 assay  . Factor 7 assay    No orders of Catherine defined types were placed in this encounter.   Bautista Instructions  -repeat clotting factor labs - will call with results -continue plavix and lipitor for stroke prevention -continue to decrease A1c and control BP. Continue to check BP and glucose at home -continue to wear CPAP at night -continue loop recorder monitoring - currently no atrial fibrillation events -follow up 6 months with NP   Marvel PlanJindong Xu, MD PhD [facility] [address], [address] [place], [place] [address] [phone]

## 2017-08-08 NOTE — Patient Instructions (Addendum)
-  repeat clotting factor labs - will call with results -continue plavix and lipitor for stroke prevention -continue to decrease A1c and control BP. Continue to check BP and glucose at home -continue to wear CPAP at night -continue loop recorder monitoring - currently no atrial fibrillation events -follow up 6 months with NP

## 2017-08-09 LAB — FACTOR 7 ASSAY: Factor VII Activity: 150 % (ref 51–186)

## 2017-08-09 LAB — FACTOR 8 ASSAY: Factor VIII Activity: 186 % — ABNORMAL HIGH (ref 57–163)

## 2017-08-10 ENCOUNTER — Telehealth: Payer: Self-pay

## 2017-08-10 LAB — CUP PACEART REMOTE DEVICE CHECK
Date Time Interrogation Session: 20181229173942
Implantable Pulse Generator Implant Date: 20170606

## 2017-08-10 NOTE — Telephone Encounter (Signed)
-----   Message from Marvel PlanJindong Xu, MD sent at 08/09/2017  5:00 PM EST ----- Could you please let the patient know that the blood test done yesterday in our office showed much improved results. One test previously slightly high, now is normal. The other test previously high and now decreased well from prior and now only slightly elevated. No hematology referral needed. It will be reasonable to repeat one more time at next visit with hope that it will be normal at that time. Please continue current treatment. Thanks.  Marvel PlanJindong Xu, MD PhD Stroke Neurology 08/09/2017 4:59 PM

## 2017-08-10 NOTE — Telephone Encounter (Signed)
Pt is returning call.  

## 2017-08-10 NOTE — Telephone Encounter (Signed)
Notes recorded by Hildred AlaminMurrell, Katrina Y, RN on 08/10/2017 at 9:08 AM EST Left vm for patient to call back about lab work results. ------

## 2017-08-10 NOTE — Telephone Encounter (Signed)
Notes recorded by Hildred AlaminMurrell, Katrina Y, RN on 08/10/2017 at 11:47 AM EST Rn call patient about her factor 8 essay. Rn stated per Dr.Xu the results showed much improved results. One test previously slightly high, now is normal. The other test previously high and now decreased well from prior and now only slightly elevated. No hematology referral needed. It will reasonable to repeat one more time at next visit with hope that it will be normal at that time. Please continue current treatment. PT verbalized understanding. PT will be seen in 6 months, and have follow up lab work for factor 8 assay.

## 2017-08-28 ENCOUNTER — Ambulatory Visit (INDEPENDENT_AMBULATORY_CARE_PROVIDER_SITE_OTHER): Payer: BC Managed Care – PPO | Admitting: *Deleted

## 2017-08-28 DIAGNOSIS — I639 Cerebral infarction, unspecified: Secondary | ICD-10-CM

## 2017-08-28 NOTE — Progress Notes (Signed)
Carelink Summary Report / Loop Recorder 

## 2017-09-11 LAB — CUP PACEART REMOTE DEVICE CHECK
Date Time Interrogation Session: 20190128191038
Implantable Pulse Generator Implant Date: 20170606

## 2017-10-02 ENCOUNTER — Ambulatory Visit (INDEPENDENT_AMBULATORY_CARE_PROVIDER_SITE_OTHER): Payer: BC Managed Care – PPO | Admitting: *Deleted

## 2017-10-02 DIAGNOSIS — I639 Cerebral infarction, unspecified: Secondary | ICD-10-CM

## 2017-10-02 NOTE — Progress Notes (Signed)
Carelink Summary Report / Loop Recorder 

## 2017-10-04 ENCOUNTER — Other Ambulatory Visit: Payer: Self-pay | Admitting: Internal Medicine

## 2017-10-04 ENCOUNTER — Ambulatory Visit
Admission: RE | Admit: 2017-10-04 | Discharge: 2017-10-04 | Disposition: A | Discharge status: Home / Self Care | Payer: BC Managed Care – PPO | Source: Ambulatory Visit | Attending: Internal Medicine | Admitting: Internal Medicine

## 2017-10-04 DIAGNOSIS — R0789 Other chest pain: Secondary | ICD-10-CM

## 2017-10-19 ENCOUNTER — Telehealth: Payer: Self-pay

## 2017-10-19 NOTE — Telephone Encounter (Signed)
[facility] 10/16/2017 twice,and confirmed.

## 2017-11-02 ENCOUNTER — Ambulatory Visit (INDEPENDENT_AMBULATORY_CARE_PROVIDER_SITE_OTHER): Payer: BC Managed Care – PPO | Admitting: *Deleted

## 2017-11-02 DIAGNOSIS — I639 Cerebral infarction, unspecified: Secondary | ICD-10-CM | POA: Diagnosis not present

## 2017-11-06 NOTE — Progress Notes (Signed)
Carelink Summary Report / Loop Recorder 

## 2017-11-07 LAB — CUP PACEART REMOTE DEVICE CHECK
Date Time Interrogation Session: 20190302193537
Implantable Pulse Generator Implant Date: 20170606

## 2017-12-05 ENCOUNTER — Ambulatory Visit (INDEPENDENT_AMBULATORY_CARE_PROVIDER_SITE_OTHER): Payer: BC Managed Care – PPO | Admitting: *Deleted

## 2017-12-05 DIAGNOSIS — I639 Cerebral infarction, unspecified: Secondary | ICD-10-CM | POA: Diagnosis not present

## 2017-12-06 LAB — CUP PACEART REMOTE DEVICE CHECK
Date Time Interrogation Session: 20190404203944
Implantable Pulse Generator Implant Date: 20170606

## 2017-12-06 NOTE — Progress Notes (Signed)
Carelink Summary Report / Loop Recorder 

## 2017-12-26 LAB — CUP PACEART REMOTE DEVICE CHECK
Date Time Interrogation Session: 20190507203945
Implantable Pulse Generator Implant Date: 20170606

## 2017-12-27 ENCOUNTER — Telehealth: Payer: Self-pay | Admitting: Cardiology

## 2017-12-27 ENCOUNTER — Telehealth: Payer: Self-pay | Admitting: *Deleted

## 2017-12-27 NOTE — Telephone Encounter (Signed)
No new note needed/ error °

## 2017-12-27 NOTE — Telephone Encounter (Signed)
Spoke with patient regarding brady episode noted on LINQ summary report, episode occurred on 11/07/17 at 0951, duration 40sec.  Carelink alerts for pause/brady episodes were not enabled due to cryptogenic stroke implant indication, but have now been enabled.   Patient is unable to recall any symptoms associated with this specific episode.  She does recall occasional brief lightheadedness episodes in the past, but cannot narrow down dates/times.  Advised patient that alerts have been enabled for pause/brady and that we will now call her if any future episodes to discuss symptoms.  Patient is agreeable to this plan.  Routed to Dr. Elberta Fortis for review.

## 2017-12-27 NOTE — Telephone Encounter (Signed)
-----   Message from Will Jorja Loa, MD sent at 12/27/2017  9:28 AM EDT ----- Abnormal LINQ reviewed. Notable for 40 seconds of heart block. Need to find out if patient was symptomatic during this.Marland Kitchen

## 2017-12-27 NOTE — Telephone Encounter (Signed)
Follow up   Patient called back to report she did have a colonoscopy on 4/9 .

## 2017-12-28 NOTE — Telephone Encounter (Signed)
Discussed with Dr. Elberta Fortis, who recommended no changes at this time as patient recalled that she was having a colonoscopy and an EGD on that date/time.  Patient made aware and is appreciative of call.  She denies any additional questions or concerns at this time.

## 2018-01-08 ENCOUNTER — Ambulatory Visit (INDEPENDENT_AMBULATORY_CARE_PROVIDER_SITE_OTHER): Payer: BC Managed Care – PPO | Admitting: *Deleted

## 2018-01-08 DIAGNOSIS — I639 Cerebral infarction, unspecified: Secondary | ICD-10-CM

## 2018-01-08 NOTE — Progress Notes (Signed)
Carelink Summary Report / Loop Recorder 

## 2018-01-16 ENCOUNTER — Other Ambulatory Visit: Payer: Self-pay | Admitting: Neurology

## 2018-01-16 DIAGNOSIS — I639 Cerebral infarction, unspecified: Secondary | ICD-10-CM

## 2018-02-05 ENCOUNTER — Ambulatory Visit: Payer: BC Managed Care – PPO | Admitting: Adult Health

## 2018-02-05 ENCOUNTER — Encounter: Payer: Self-pay | Admitting: Adult Health

## 2018-02-05 VITALS — BP 129/65 | HR 57 | Ht 66.0 in | Wt 282.2 lb

## 2018-02-05 DIAGNOSIS — I10 Essential (primary) hypertension: Secondary | ICD-10-CM | POA: Diagnosis not present

## 2018-02-05 DIAGNOSIS — Z794 Long term (current) use of insulin: Secondary | ICD-10-CM | POA: Diagnosis not present

## 2018-02-05 DIAGNOSIS — E785 Hyperlipidemia, unspecified: Secondary | ICD-10-CM | POA: Diagnosis not present

## 2018-02-05 DIAGNOSIS — E1165 Type 2 diabetes mellitus with hyperglycemia: Secondary | ICD-10-CM

## 2018-02-05 DIAGNOSIS — Z8673 Personal history of transient ischemic attack (TIA), and cerebral infarction without residual deficits: Secondary | ICD-10-CM

## 2018-02-05 NOTE — Progress Notes (Signed)
STROKE NEUROLOGY FOLLOW UP NOTE  NAME: Margot ChimesLou S Mcelhaney DOB: 1950-12-23  REASON FOR VISIT: stroke follow up HISTORY FROM: pt and chart  Today we had the pleasure of seeing Margot ChimesLou S Molder in follow-up at our [facility]. Pt was accompanied by no one.   History Summary Ms. Margot ChimesLou S Mccullum is a 67 y.o. female with history of diabetes mellitus, hypertension, hyperlipidemia, previous strokes, obesity, and fibromyalgia was admitted on 01/02/16 for right upper extremity numbness and weakness. MRI showed small left frontal cortical infarct, MRA mild to moderate narrowing of the right ICA. CUS, TTE and DVT negative. TEE showed redundant chordae tendinea, thin mobile density anterior MV could be small ruptured chordae tendinae but not rule out small vegetation. However, pt had no sign of endocarditis. Loop recorder placed. LDL 96 and A1C 9.9. She was discharged with DAPT as she failed plavix PTA. Her lipitor increased from 20 to 40mg . Symptoms resolved and she was discharged home.    History of stroke  2002, left MCA infarct  12/2009, right frontal cortical infarct  No residue  03/22/16 follow-up - the patient has been doing well. No stroke like symptoms. BP 104/58. Glucose fluctuates at home as her lantus changed to levemir. On DAPT. Interested in RESPECT ESUS trial.   04/04/17 follow-up - During the interval time, patient has been doing well, no stroke like symptoms. Finished RESPECT ESUS trial and now on plavix. BP today 104/66 and glucose at home in better control with Januvia and Levemir.  08/08/17 follow up: Overall doing well. No complaints at todays visit. States A1c currently 7.1 which patient is happy with as it was previously >9. Pt states she has been eating healthy and taking her medications as prescribed. LDL in good limits per patient but unable to state exact number. Continues to take Lipitor without side effects. BP today 138/70 which per patient is high, at home usually SBP 110-120's. Continues to  take Plavix without side effects. Loop recorder w/o AF episodes. Continues to wear CPAP for sleep apnea. No new CVA/TIA symptoms.   02/05/18 UPDATE: Patient returns today for six-month follow-up and overall is doing well.  She continues to take Plavix without side effects of bleeding or bruising.  Continues to take atorvastatin without side effects myalgias.  Recent lab work done by PCP with LDL 72.  Recent A1c 7.6.  Patient states she has been changing her diet and attempting to eat healthier.  Blood pressure satisfactory 129/64.  She does have a follow-up visit with Dr. Allie Dimmersborn to check on OSA with CPAP.  She was told by [facility] that her machine is over 66 years old and may possibly need to look into obtaining a new one.  She did have one episode last week of a dizziness sensation that lasted approximately 1 minute but patient also states she was having increased activity along with stress at that time.  Patient has not experienced this episode since that time.  Loop recorder has not shown atrial fibrillation thus far.  Factor VII and VIII were repeated at previous visit which did show normal levels but was recommended to repeat these labs at this visit.  Patient returns today for six-month follow-up and overall doing well.  Denies new or worsening stroke/TIA symptoms.    REVIEW OF SYSTEMS: Full 14 system review of systems performed and notable only for those listed below and in HPI above, all others are negative: Dizziness, apnea and daytime sleepiness  The following represents the patient's updated allergies and side effects list: Allergies  Allergen Reactions  . Demerol Nausea And Vomiting  . Excedrin Extra Strength [Aspirin-Acetaminophen-Caffeine] Other (See Comments)    Weird feeling  . Invokana [Canagliflozin] Other (See Comments)    Decreasing renal function and extremely lethargic  . Janumet [Sitagliptin-Metformin Hcl] Other (See Comments)    Bloating and gas  . Meperidine  Other (See Comments)  . Morphine And Related Other (See Comments)    Weird feeling  . Novocain [Procaine Hcl] Nausea And Vomiting    Per pt "I never had a problem with lidocaine, just novocain."  . Procaine Other (See Comments)    The neurologically relevant items on the patient's problem list were reviewed on today's visit.  Neurologic Examination  A problem focused neurological exam (12 or more points of the single system neurologic examination, vital signs counts as 1 point, cranial nerves count for 8 points) was performed.  Blood pressure 129/65, pulse (!) 57, height 5\' 6"  (1.676 m), weight 282 lb 3.2 oz (128 kg).  General - Well nourished, well developed, pleasant middle-aged African-American female, in no apparent distress.  Ophthalmologic - Sharp disc margins OU.  Cardiovascular - Regular rate and rhythm.  Mental Status -  Level of arousal and orientation to time, place, and person were intact. Language including expression, naming, repetition, comprehension was assessed and found intact. Fund of Knowledge was assessed and was intact  Cranial Nerves II - XII - II - Visual field intact OU. III, IV, VI - Extraocular movements intact. V - Facial sensation intact bilaterally. VII - Facial movement intact bilaterally VIII - Hearing & vestibular intact bilaterally. X - Palate elevates symmetrically. XI - Chin turning & shoulder shrug intact bilaterally. XII - Tongue protrusion intact.  Motor Strength - The patient's strength was normal in all extremities and pronator drift was absent.  Bulk was normal and fasciculations were absent.   Motor Tone - Muscle tone was assessed at the neck and appendages and was normal.  Reflexes - The patient's reflexes were 1+ in all extremities and she had no pathological reflexes.  Sensory - Light touch, temperature/pinprick, vibration and proprioception, and Romberg testing were assessed and were normal.    Coordination - The patient had  normal movements in the hands and feet with no ataxia or dysmetria.  Tremor was absent.  Gait and Station - The patient's transfers, posture, gait, station, and turns were observed as normal.     Assessment: As you may recall, she is a 67 y.o. African American female with PMH of diabetes mellitus, hypertension, hyperlipidemia, previous strokes in left MCA in 2002 and right frontal in 2011, obesity, and fibromyalgia was admitted on 01/02/16 for punctate left frontal cortical infarct, MRA mild to moderate narrowing of the right ICA. CUS, TTE and DVT negative. TEE showed redundant chordae tendinea, thin mobile density anterior MV could be small ruptured chordae tendinae but not rule out small vegetation. However, pt had no sign of endocarditis. TEE also showed positive PFO with significant right to left shunt. Loop recorder placed. LDL 96 and A1C 9.9. She was discharged with DAPT as she failed plavix PTA. Her lipitor increased from 20 to 40mg . Symptoms resolved and she was discharged home. During the interval time, the patient has been doing well. Glucose fluctuates at home. Enrolled to RESPECT ESUS trial and finished. Now on plavix.  She had left MCA infarct in 2002 in her 34s, right frontal in 2011 and left frontal  cortical in 12/2015. She had stroke first in young age. TEE showed abnormal MV thin mobile density and positive PFO. She denied any DVT, but she had one miscarriage out of 5 pregnancies. She has family hx of DVT. Loop negative for afib. Repeat TEE showed no more MV mobile density but still has ASA and PFO. Hypercoagulable panel on 04/04/17 shows increased Factor VIII at 245, as well as slightly elevated factor VII antigen. Will repeat labs and consider sending patient to hematology if repeat level remains increased.  However, he does have multiple stroke risk factors, including DM, HTN, HLD, obesity.    Plan:  - repeat Factor VII and VIII labs as recommended -advised patient that she will be called  with abnormal results  - continue plavix and lipitor for stroke prevention -Follow-up with Dr. Earl Gala for continued OSA management - Follow up with your primary care physician for stroke risk factor modification. Recommend maintain blood pressure goal <130/80, diabetes with hemoglobin A1c goal below 7.0% and lipids with LDL cholesterol goal below 70 mg/dL.  - check BP and glucose at home and record - continue loop recorder monitoring - diabetic diet and regular exercise  - follow up in 1 year or as needed   I spent more than 25 minutes of face to face time with the patient. Greater than 50% of time was spent in counseling and coordination of care. We discussed about repeating hematology panel and consider hematology consult if needed.   George Hugh, AGNP-BC  [facility] [address] [place], [place] [address]  Phone [phone] Fax [phone] Note: This document was prepared with digital dictation and possible smart phrase technology. Any transcriptional errors that result from this process are unintentional.

## 2018-02-05 NOTE — Patient Instructions (Addendum)
Continue clopidogrel 75 mg daily  and lipitor  for secondary stroke prevention  Continue to follow up with PCP regarding cholesterol, diabetes and blood pressure management   Continue to stay active and eat healthy   We will continue to monitor your loop recorder  We will repeat Factor VII and VIII labs today - we will call you with abnormal results  Maintain strict control of hypertension with blood pressure goal below 130/90, diabetes with hemoglobin A1c goal below 6.5% and cholesterol with LDL cholesterol (bad cholesterol) goal below 70 mg/dL. I also advised the patient to eat a healthy diet with plenty of whole grains, cereals, fruits and vegetables, exercise regularly and maintain ideal body weight.  Followup in the future with me as needed or call earlier if needed           Thank you for coming to see [facility] at [facility]. I hope we have been able to provide you high quality care today.  You may receive a patient satisfaction survey over the next few weeks. We would appreciate your feedback and comments so that we may continue to improve ourselves and the health of our patients.

## 2018-02-06 LAB — FACTOR 8 ASSAY: Factor VIII Activity: 148 % — ABNORMAL HIGH (ref 56–140)

## 2018-02-06 LAB — FACTOR 7 ASSAY: Factor VII Activity: 145 % (ref 51–186)

## 2018-02-09 ENCOUNTER — Ambulatory Visit (INDEPENDENT_AMBULATORY_CARE_PROVIDER_SITE_OTHER): Payer: BC Managed Care – PPO | Admitting: *Deleted

## 2018-02-09 DIAGNOSIS — I639 Cerebral infarction, unspecified: Secondary | ICD-10-CM | POA: Diagnosis not present

## 2018-02-12 NOTE — Progress Notes (Signed)
Carelink Summary Report / Loop Recorder 

## 2018-02-13 LAB — CUP PACEART REMOTE DEVICE CHECK
Date Time Interrogation Session: 20190609214118
Implantable Pulse Generator Implant Date: 20170606

## 2018-03-14 ENCOUNTER — Ambulatory Visit (INDEPENDENT_AMBULATORY_CARE_PROVIDER_SITE_OTHER): Payer: BC Managed Care – PPO | Admitting: *Deleted

## 2018-03-14 DIAGNOSIS — I639 Cerebral infarction, unspecified: Secondary | ICD-10-CM | POA: Diagnosis not present

## 2018-03-15 NOTE — Progress Notes (Signed)
Carelink Summary Report / Loop Recorder 

## 2018-03-22 LAB — CUP PACEART REMOTE DEVICE CHECK
Date Time Interrogation Session: 20190712220543
Implantable Pulse Generator Implant Date: 20170606

## 2018-04-16 ENCOUNTER — Ambulatory Visit (INDEPENDENT_AMBULATORY_CARE_PROVIDER_SITE_OTHER): Payer: BC Managed Care – PPO | Admitting: *Deleted

## 2018-04-16 DIAGNOSIS — I639 Cerebral infarction, unspecified: Secondary | ICD-10-CM

## 2018-04-17 NOTE — Progress Notes (Signed)
Carelink Summary Report / Loop Recorder 

## 2018-04-19 LAB — CUP PACEART REMOTE DEVICE CHECK
Date Time Interrogation Session: 20190814221050
Implantable Pulse Generator Implant Date: 20170606

## 2018-04-29 LAB — CUP PACEART REMOTE DEVICE CHECK
Date Time Interrogation Session: 20190917004000
Implantable Pulse Generator Implant Date: 20170606

## 2018-05-21 ENCOUNTER — Ambulatory Visit (INDEPENDENT_AMBULATORY_CARE_PROVIDER_SITE_OTHER): Payer: BC Managed Care – PPO | Admitting: *Deleted

## 2018-05-21 DIAGNOSIS — I639 Cerebral infarction, unspecified: Secondary | ICD-10-CM

## 2018-05-21 NOTE — Progress Notes (Signed)
Carelink Summary Report / Loop Recorder 

## 2018-06-08 LAB — CUP PACEART REMOTE DEVICE CHECK
Date Time Interrogation Session: 20191020013938
Implantable Pulse Generator Implant Date: 20170606

## 2018-06-21 ENCOUNTER — Ambulatory Visit (INDEPENDENT_AMBULATORY_CARE_PROVIDER_SITE_OTHER): Payer: BC Managed Care – PPO

## 2018-06-21 DIAGNOSIS — I639 Cerebral infarction, unspecified: Secondary | ICD-10-CM

## 2018-06-22 NOTE — Progress Notes (Signed)
Carelink Summary Report / Loop Recorder 

## 2018-07-24 ENCOUNTER — Ambulatory Visit (INDEPENDENT_AMBULATORY_CARE_PROVIDER_SITE_OTHER): Payer: BC Managed Care – PPO

## 2018-07-24 DIAGNOSIS — I639 Cerebral infarction, unspecified: Secondary | ICD-10-CM

## 2018-07-25 LAB — CUP PACEART REMOTE DEVICE CHECK
Date Time Interrogation Session: 20191225031018
Implantable Pulse Generator Implant Date: 20170606

## 2018-07-26 NOTE — Progress Notes (Signed)
Carelink Summary Report / Loop Recorder 

## 2018-08-05 LAB — CUP PACEART REMOTE DEVICE CHECK
Date Time Interrogation Session: 20191122023951
Implantable Pulse Generator Implant Date: 20170606

## 2018-08-27 ENCOUNTER — Ambulatory Visit (INDEPENDENT_AMBULATORY_CARE_PROVIDER_SITE_OTHER): Payer: BC Managed Care – PPO

## 2018-08-27 DIAGNOSIS — I639 Cerebral infarction, unspecified: Secondary | ICD-10-CM | POA: Diagnosis not present

## 2018-08-28 LAB — CUP PACEART REMOTE DEVICE CHECK
Date Time Interrogation Session: 20200127030836
Implantable Pulse Generator Implant Date: 20170606

## 2018-08-28 NOTE — Progress Notes (Signed)
Carelink Summary Report / Loop Recorder 

## 2018-09-28 ENCOUNTER — Ambulatory Visit (INDEPENDENT_AMBULATORY_CARE_PROVIDER_SITE_OTHER): Payer: BC Managed Care – PPO | Admitting: *Deleted

## 2018-09-28 DIAGNOSIS — I639 Cerebral infarction, unspecified: Secondary | ICD-10-CM

## 2018-09-29 LAB — CUP PACEART REMOTE DEVICE CHECK
Date Time Interrogation Session: 20200229030615
Implantable Pulse Generator Implant Date: 20170606

## 2018-10-03 NOTE — Progress Notes (Signed)
Carelink Summary Report / Loop Recorder 

## 2018-10-31 ENCOUNTER — Ambulatory Visit (INDEPENDENT_AMBULATORY_CARE_PROVIDER_SITE_OTHER): Payer: BC Managed Care – PPO | Admitting: *Deleted

## 2018-10-31 ENCOUNTER — Other Ambulatory Visit: Payer: Self-pay

## 2018-10-31 DIAGNOSIS — I639 Cerebral infarction, unspecified: Secondary | ICD-10-CM | POA: Diagnosis not present

## 2018-11-01 LAB — CUP PACEART REMOTE DEVICE CHECK
Date Time Interrogation Session: 20200402070228
Implantable Pulse Generator Implant Date: 20170606

## 2018-11-05 NOTE — Progress Notes (Signed)
Carelink Summary Report / Loop Recorder 

## 2018-12-03 ENCOUNTER — Other Ambulatory Visit: Payer: Self-pay

## 2018-12-03 ENCOUNTER — Ambulatory Visit (INDEPENDENT_AMBULATORY_CARE_PROVIDER_SITE_OTHER): Payer: BC Managed Care – PPO | Admitting: *Deleted

## 2018-12-03 DIAGNOSIS — I639 Cerebral infarction, unspecified: Secondary | ICD-10-CM | POA: Diagnosis not present

## 2018-12-04 LAB — CUP PACEART REMOTE DEVICE CHECK
Date Time Interrogation Session: 20200505080725
Implantable Pulse Generator Implant Date: 20170606

## 2018-12-10 NOTE — Progress Notes (Signed)
Carelink Summary Report / Loop Recorder 

## 2019-01-07 ENCOUNTER — Encounter (HOSPITAL_BASED_OUTPATIENT_CLINIC_OR_DEPARTMENT_OTHER): Payer: Self-pay | Admitting: Emergency Medicine

## 2019-01-07 ENCOUNTER — Emergency Department (HOSPITAL_BASED_OUTPATIENT_CLINIC_OR_DEPARTMENT_OTHER): Payer: BC Managed Care – PPO

## 2019-01-07 ENCOUNTER — Other Ambulatory Visit: Payer: Self-pay

## 2019-01-07 ENCOUNTER — Inpatient Hospital Stay (HOSPITAL_BASED_OUTPATIENT_CLINIC_OR_DEPARTMENT_OTHER)
Admission: EM | Admit: 2019-01-07 | Discharge: 2019-01-08 | DRG: 065 | Disposition: A | Discharge status: Home / Self Care | Payer: BC Managed Care – PPO | Attending: Internal Medicine | Admitting: Internal Medicine

## 2019-01-07 ENCOUNTER — Emergency Department (HOSPITAL_COMMUNITY): Payer: BC Managed Care – PPO

## 2019-01-07 ENCOUNTER — Telehealth: Payer: Self-pay | Admitting: Cardiology

## 2019-01-07 ENCOUNTER — Ambulatory Visit (INDEPENDENT_AMBULATORY_CARE_PROVIDER_SITE_OTHER): Payer: BC Managed Care – PPO | Admitting: *Deleted

## 2019-01-07 DIAGNOSIS — Z885 Allergy status to narcotic agent status: Secondary | ICD-10-CM | POA: Diagnosis not present

## 2019-01-07 DIAGNOSIS — Z9884 Bariatric surgery status: Secondary | ICD-10-CM | POA: Diagnosis not present

## 2019-01-07 DIAGNOSIS — Z20828 Contact with and (suspected) exposure to other viral communicable diseases: Secondary | ICD-10-CM | POA: Diagnosis not present

## 2019-01-07 DIAGNOSIS — R29898 Other symptoms and signs involving the musculoskeletal system: Secondary | ICD-10-CM

## 2019-01-07 DIAGNOSIS — R29700 NIHSS score 0: Secondary | ICD-10-CM | POA: Diagnosis present

## 2019-01-07 DIAGNOSIS — D649 Anemia, unspecified: Secondary | ICD-10-CM | POA: Diagnosis not present

## 2019-01-07 DIAGNOSIS — Z8051 Family history of malignant neoplasm of kidney: Secondary | ICD-10-CM | POA: Diagnosis not present

## 2019-01-07 DIAGNOSIS — E785 Hyperlipidemia, unspecified: Secondary | ICD-10-CM | POA: Diagnosis present

## 2019-01-07 DIAGNOSIS — Z801 Family history of malignant neoplasm of trachea, bronchus and lung: Secondary | ICD-10-CM

## 2019-01-07 DIAGNOSIS — I6340 Cerebral infarction due to embolism of unspecified cerebral artery: Principal | ICD-10-CM | POA: Diagnosis present

## 2019-01-07 DIAGNOSIS — E0590 Thyrotoxicosis, unspecified without thyrotoxic crisis or storm: Secondary | ICD-10-CM | POA: Diagnosis not present

## 2019-01-07 DIAGNOSIS — N179 Acute kidney failure, unspecified: Secondary | ICD-10-CM | POA: Diagnosis not present

## 2019-01-07 DIAGNOSIS — E0500 Thyrotoxicosis with diffuse goiter without thyrotoxic crisis or storm: Secondary | ICD-10-CM | POA: Diagnosis not present

## 2019-01-07 DIAGNOSIS — Z6841 Body Mass Index (BMI) 40.0 and over, adult: Secondary | ICD-10-CM

## 2019-01-07 DIAGNOSIS — Z7902 Long term (current) use of antithrombotics/antiplatelets: Secondary | ICD-10-CM

## 2019-01-07 DIAGNOSIS — I10 Essential (primary) hypertension: Secondary | ICD-10-CM | POA: Diagnosis present

## 2019-01-07 DIAGNOSIS — M797 Fibromyalgia: Secondary | ICD-10-CM | POA: Diagnosis not present

## 2019-01-07 DIAGNOSIS — Z8249 Family history of ischemic heart disease and other diseases of the circulatory system: Secondary | ICD-10-CM

## 2019-01-07 DIAGNOSIS — Z8673 Personal history of transient ischemic attack (TIA), and cerebral infarction without residual deficits: Secondary | ICD-10-CM | POA: Diagnosis not present

## 2019-01-07 DIAGNOSIS — E1165 Type 2 diabetes mellitus with hyperglycemia: Secondary | ICD-10-CM

## 2019-01-07 DIAGNOSIS — Z886 Allergy status to analgesic agent status: Secondary | ICD-10-CM | POA: Diagnosis not present

## 2019-01-07 DIAGNOSIS — I63311 Cerebral infarction due to thrombosis of right middle cerebral artery: Secondary | ICD-10-CM | POA: Diagnosis not present

## 2019-01-07 DIAGNOSIS — Z823 Family history of stroke: Secondary | ICD-10-CM | POA: Diagnosis not present

## 2019-01-07 DIAGNOSIS — Z794 Long term (current) use of insulin: Secondary | ICD-10-CM

## 2019-01-07 DIAGNOSIS — I639 Cerebral infarction, unspecified: Secondary | ICD-10-CM

## 2019-01-07 DIAGNOSIS — E1169 Type 2 diabetes mellitus with other specified complication: Secondary | ICD-10-CM | POA: Diagnosis present

## 2019-01-07 LAB — CUP PACEART REMOTE DEVICE CHECK
Date Time Interrogation Session: 20200607124130
Implantable Pulse Generator Implant Date: 20170606

## 2019-01-07 LAB — COMPREHENSIVE METABOLIC PANEL
ALT: 31 U/L (ref 0–44)
AST: 31 U/L (ref 15–41)
Albumin: 3.5 g/dL (ref 3.5–5.0)
Alkaline Phosphatase: 105 U/L (ref 38–126)
Anion gap: 9 (ref 5–15)
BUN: 16 mg/dL (ref 8–23)
CO2: 21 mmol/L — ABNORMAL LOW (ref 22–32)
Calcium: 8.7 mg/dL — ABNORMAL LOW (ref 8.9–10.3)
Chloride: 108 mmol/L (ref 98–111)
Creatinine, Ser: 1.32 mg/dL — ABNORMAL HIGH (ref 0.44–1.00)
GFR calc Af Amer: 49 mL/min — ABNORMAL LOW (ref >60)
GFR calc non Af Amer: 42 mL/min — ABNORMAL LOW (ref >60)
Glucose, Bld: 176 mg/dL — ABNORMAL HIGH (ref 70–99)
Potassium: 4 mmol/L (ref 3.5–5.1)
Sodium: 138 mmol/L (ref 135–145)
Total Bilirubin: 0.6 mg/dL (ref 0.3–1.2)
Total Protein: 6.9 g/dL (ref 6.5–8.1)

## 2019-01-07 LAB — CBC WITH DIFFERENTIAL/PLATELET
Abs Immature Granulocytes: 0.01 10*3/uL (ref 0.00–0.07)
Basophils Absolute: 0 10*3/uL (ref 0.0–0.1)
Basophils Relative: 0 %
Eosinophils Absolute: 0 10*3/uL (ref 0.0–0.5)
Eosinophils Relative: 1 %
HCT: 37.1 % (ref 36.0–46.0)
Hemoglobin: 11.7 g/dL — ABNORMAL LOW (ref 12.0–15.0)
Immature Granulocytes: 0 %
Lymphocytes Relative: 23 %
Lymphs Abs: 1.7 10*3/uL (ref 0.7–4.0)
MCH: 28.4 pg (ref 26.0–34.0)
MCHC: 31.5 g/dL (ref 30.0–36.0)
MCV: 90 fL (ref 80.0–100.0)
Monocytes Absolute: 0.5 10*3/uL (ref 0.1–1.0)
Monocytes Relative: 7 %
Neutro Abs: 4.9 10*3/uL (ref 1.7–7.7)
Neutrophils Relative %: 69 %
Platelets: 296 10*3/uL (ref 150–400)
RBC: 4.12 MIL/uL (ref 3.87–5.11)
RDW: 14 % (ref 11.5–15.5)
WBC: 7.1 10*3/uL (ref 4.0–10.5)
nRBC: 0 % (ref 0.0–0.2)

## 2019-01-07 LAB — SARS CORONAVIRUS 2 BY RT PCR (HOSPITAL ORDER, PERFORMED IN ~~LOC~~ HOSPITAL LAB): SARS Coronavirus 2: NEGATIVE

## 2019-01-07 MED ORDER — OMEGA-3-ACID ETHYL ESTERS 1 G PO CAPS
1000.0000 mg | ORAL_CAPSULE | Freq: Every day | ORAL | Status: DC
  Administered 2019-01-08: 1000 mg via ORAL
  Filled 2019-01-07: qty 1

## 2019-01-07 MED ORDER — HAIR/SKIN/NAILS PO CAPS: 1.0000 | ORAL_CAPSULE | Freq: Every day | ORAL | Status: DC

## 2019-01-07 MED ORDER — SENNOSIDES-DOCUSATE SODIUM 8.6-50 MG PO TABS: 1.0000 | ORAL_TABLET | Freq: Every evening | ORAL | Status: DC | PRN

## 2019-01-07 MED ORDER — ENOXAPARIN SODIUM 40 MG/0.4ML ~~LOC~~ SOLN
40.0000 mg | SUBCUTANEOUS | Status: DC
  Administered 2019-01-08: 40 mg via SUBCUTANEOUS
  Filled 2019-01-07: qty 0.4

## 2019-01-07 MED ORDER — VITAMIN B-12 1000 MCG PO TABS
1000.0000 ug | ORAL_TABLET | Freq: Every day | ORAL | Status: DC
  Administered 2019-01-08: 1000 ug via ORAL
  Filled 2019-01-07: qty 1

## 2019-01-07 MED ORDER — INSULIN ASPART 100 UNIT/ML ~~LOC~~ SOLN
0.0000 [IU] | Freq: Three times a day (TID) | SUBCUTANEOUS | Status: DC
  Administered 2019-01-08 (×2): 2 [IU] via SUBCUTANEOUS

## 2019-01-07 MED ORDER — STROKE: EARLY STAGES OF RECOVERY BOOK
Freq: Once | Status: AC
  Administered 2019-01-07: 23:00:00
  Filled 2019-01-07: qty 1

## 2019-01-07 MED ORDER — ONDANSETRON HCL 4 MG/2ML IJ SOLN: 4.0000 mg | Freq: Three times a day (TID) | INTRAMUSCULAR | Status: DC | PRN

## 2019-01-07 MED ORDER — METHIMAZOLE 10 MG PO TABS
10.0000 mg | ORAL_TABLET | Freq: Every day | ORAL | Status: DC
  Administered 2019-01-08: 10 mg via ORAL
  Filled 2019-01-07: qty 1

## 2019-01-07 MED ORDER — MAGNESIUM OXIDE 400 (241.3 MG) MG PO TABS
200.0000 mg | ORAL_TABLET | Freq: Every day | ORAL | Status: DC
  Administered 2019-01-08: 200 mg via ORAL
  Filled 2019-01-07: qty 1

## 2019-01-07 MED ORDER — INSULIN DETEMIR 100 UNIT/ML ~~LOC~~ SOLN
30.0000 [IU] | Freq: Every day | SUBCUTANEOUS | Status: DC
  Administered 2019-01-08: 30 [IU] via SUBCUTANEOUS
  Filled 2019-01-07 (×3): qty 0.3

## 2019-01-07 MED ORDER — FERROUS SULFATE 325 (65 FE) MG PO TABS
325.0000 mg | ORAL_TABLET | Freq: Every day | ORAL | Status: DC
  Administered 2019-01-08: 325 mg via ORAL
  Filled 2019-01-07: qty 1

## 2019-01-07 MED ORDER — VITAMIN D 25 MCG (1000 UNIT) PO TABS
2000.0000 [IU] | ORAL_TABLET | Freq: Every day | ORAL | Status: DC
  Administered 2019-01-08: 2000 [IU] via ORAL
  Filled 2019-01-07: qty 2

## 2019-01-07 MED ORDER — CLOPIDOGREL BISULFATE 75 MG PO TABS
75.0000 mg | ORAL_TABLET | Freq: Every day | ORAL | Status: DC
  Administered 2019-01-08: 75 mg via ORAL
  Filled 2019-01-07: qty 1

## 2019-01-07 MED ORDER — HYDRALAZINE HCL 20 MG/ML IJ SOLN: 5.0000 mg | INTRAMUSCULAR | Status: DC | PRN

## 2019-01-07 MED ORDER — B COMPLEX-C PO TABS
1.0000 | ORAL_TABLET | Freq: Every day | ORAL | Status: DC
  Administered 2019-01-08: 1 via ORAL
  Filled 2019-01-07: qty 1

## 2019-01-07 MED ORDER — ATORVASTATIN CALCIUM 40 MG PO TABS
40.0000 mg | ORAL_TABLET | Freq: Every day | ORAL | Status: DC
  Administered 2019-01-08: 40 mg via ORAL
  Filled 2019-01-07: qty 1

## 2019-01-07 MED ORDER — SODIUM CHLORIDE 0.9 % IV SOLN
INTRAVENOUS | Status: DC
  Administered 2019-01-08 (×2): via INTRAVENOUS

## 2019-01-07 MED ORDER — VITAMIN C 500 MG PO TABS
500.0000 mg | ORAL_TABLET | Freq: Every day | ORAL | Status: DC
  Administered 2019-01-08: 500 mg via ORAL
  Filled 2019-01-07: qty 1

## 2019-01-07 NOTE — ED Notes (Signed)
Notified EDP that pt needed to be seen.

## 2019-01-07 NOTE — Telephone Encounter (Signed)
New message  Patient wants a call to discuss if there have been any abnormal readings on her device transmissions. Please call.

## 2019-01-07 NOTE — ED Notes (Signed)
C/o rt hand weakness onset yesterday

## 2019-01-07 NOTE — ED Notes (Signed)
Patient transported to MRI 

## 2019-01-07 NOTE — H&P (Signed)
History and Physical    Catherine ChimesLou S Bur FAO:[id number] DOB: Jan 02, 1951 DOA: 01/07/2019  Referring MD/NP/PA:   PCP: Renford DillsPolite, Ronald, MD   Patient coming from:  The patient is coming from home.  At baseline, pt is independent for most of ADL.        Chief Complaint: right hand weakness  HPI: Catherine Bautista is a 68 y.o. female with medical history significant of stroke x3, hypertension, hyperlipidemia, diabetes mellitus, depression with anxiety, Graves' disease, hypothyroidism, loop recorder placement, who presents with right hand weakness.  Pt stats that she started having right hand weakness since last night. She has difficulty with gripping and dropping things at the grocery store initially. She does not have sensory loss in extremities.  She states that she had bilateral leg numbness in AM which has resolved. No slurred speech, vision change or hearing loss. She denies chest pain, shortness of breath, cough, fever or chills. Patient states that she has chronic mild diarrhea, which has not changed.  No nausea vomiting or abdominal pain.  No symptoms of UTI.  She states that she had history of 3 strokes in the past, the first with speech change and right sided weakness, the second with left sided weakness and the third with RUE weakness. She states that her weakness from previous stroke had largely recovered. Her right hand weakness is new today.  ED Course: pt was found to have WBC 7.1, pending COVID-19 test, AKI with creatinine 1.32, BUN 16, temperature normal, blood pressure 148/60, bradycardia, oxygen saturation 96% on room air.  CT head showed remote remote left temporoparietal infarct.  MRI showed cluster of 3 punctate acute infarctions in the left MCA territory. MRI of C spin showed degenerative disc disease. Pt is placed on tele bed for obs.  Neurology, Dr. Otelia LimesLindzen was consulted.  Review of Systems:   General: no fevers, chills, no body weight gain, has fatigue HEENT: no blurry vision, hearing  changes or sore throat Respiratory: no dyspnea, coughing, wheezing CV: no chest pain, no palpitations GI: no nausea, vomiting, abdominal pain, diarrhea, constipation GU: no dysuria, burning on urination, increased urinary frequency, hematuria  Ext: no leg edema Neuro: No vision change or hearing loss. Has right hand weakness. Skin: no rash, no skin tear. MSK: No muscle spasm, no deformity, no limitation of range of movement in spin Heme: No easy bruising.  Travel history: No recent long distant travel.  Allergy:  Allergies  Allergen Reactions   Canagliflozin Other (See Comments)    Decreased renal function, dizziness, and extremely lethargy   Demerol Nausea And Vomiting   Excedrin Extra Strength [Aspirin-Acetaminophen-Caffeine] Other (See Comments)    Weird feeling   Janumet [Sitagliptin-Metformin Hcl] Other (See Comments)    Bloating and gas   Meperidine Nausea And Vomiting   Morphine And Related Other (See Comments)    "Weird feelings"   Novocain [Procaine Hcl] Nausea And Vomiting    Per pt "I never had a problem with lidocaine, just novocain."   Procaine Other (See Comments)    Reaction not noted    Past Medical History:  Diagnosis Date   Anemia    Anemia, vitamin B12 deficiency    Depression with anxiety    Diabetes mellitus    Dyslipidemia    Fibromyalgia    Graves disease    Hypertension    Obesity    Stroke (HCC)    "I've had 3 strokes"    Past Surgical History:  Procedure Laterality Date  ABDOMINAL HYSTERECTOMY  2007   CHOLECYSTECTOMY     laproscopic   EP IMPLANTABLE DEVICE N/A 01/05/2016   Procedure: Loop Recorder Insertion;  Surgeon: Will Jorja Loa, MD;  Location: [facility] INVASIVE CV LAB;  Service: Cardiovascular;  Laterality: N/A;   GASTRIC BYPASS  1981   TEE WITHOUT CARDIOVERSION N/A 01/05/2016   Procedure: TRANSESOPHAGEAL ECHOCARDIOGRAM (TEE);  Surgeon: Quintella Reichert, MD;  Location: [facility] ENDOSCOPY;  Service: Cardiovascular;   Laterality: N/A;   TEE WITHOUT CARDIOVERSION N/A 04/18/2017   Procedure: TRANSESOPHAGEAL ECHOCARDIOGRAM (TEE);  Surgeon: Lewayne Bunting, MD;  Location: [facility] ENDOSCOPY;  Service: Cardiovascular;  Laterality: N/A;   therapuetic abortion  1986   associated with C-section    Social History:  reports that she has never smoked. She has never used smokeless tobacco. She reports that she does not drink alcohol or use drugs.  Family History:  Family History  Problem Relation Age of Onset   Stroke Mother    Heart failure Mother    Lung cancer Father    Kidney cancer Brother    Stroke Cousin      Prior to Admission medications   Medication Sig Start Date End Date Taking? Authorizing Provider  atorvastatin (LIPITOR) 40 MG tablet Take 1 tablet (40 mg total) by mouth daily at 6 PM. 01/06/16   Elgergawy, Leana Roe, MD  B Complex Vitamins (VITAMIN B COMPLEX PO) Take by mouth.    [provider]  b complex vitamins tablet Take 1 tablet by mouth daily. Reported on 01/13/2016    [provider]  Cholecalciferol (D 5000) 5000 units capsule Take by mouth.    [provider]  clopidogrel (PLAVIX) 75 MG tablet TAKE 1 TABLET BY MOUTH EVERY DAY 01/16/18   Huston Foley, MD  DULoxetine (CYMBALTA) 30 MG capsule Take by mouth daily. 11/30/17   [provider]  glimepiride (AMARYL) 1 MG tablet Take 4 mg by mouth daily with breakfast.  12/23/15   [provider]  hydrochlorothiazide (HYDRODIURIL) 25 MG tablet Take 25 mg by mouth daily.      [provider]  Insulin Detemir (LEVEMIR FLEXPEN [place]) Inject 48 Units into the skin at bedtime.     [provider]  IRON PO Take 65 mg by mouth.    [provider]  JANUVIA 100 MG tablet Take 100 mg by mouth daily. 11/19/15   [provider]  MAGNESIUM PO Take by mouth daily.    [provider]  Melatonin 5 MG TABS Take by mouth.    [provider]  metFORMIN (GLUCOPHAGE) 500 MG  tablet Take 500 mg by mouth 2 (two) times daily with a meal.    [provider]  methimazole (TAPAZOLE) 10 MG tablet Take 20 mg by mouth daily. 07/30/17   [provider]  multivitamin [facility]) per tablet Take 1 tablet by mouth daily.      [provider]  OMEGA 3 1200 MG CAPS Take 1,200 mg by mouth at bedtime.     [provider]  ramipril (ALTACE) 10 MG capsule Take 10 mg by mouth daily.      [provider]  SITagliptin-MetFORMIN HCl (JANUMET PO) Take 500 mg by mouth 2 (two) times daily. Reported on 01/13/2016    [provider]  triamcinolone lotion (KENALOG) 0.1 % APPLY TO AFFECTED AREA TWICE A DAY FOR 5 DAYS 01/23/18   [provider]  vitamin B-12 (CYANOCOBALAMIN) 1000 MCG tablet Take by mouth.  [provider]    Physical Exam: Vitals:   01/07/19 1830 01/07/19 2115 01/07/19 2115 01/07/19 2200  BP: (!) 138/57 (!) 148/60  (!) 148/57  Pulse: 76 (!) 58  64  Resp: 20  14   Temp:      TempSrc:      SpO2: 99% 99%  94%  Weight:      Height:       General: Not in acute distress HEENT:       Eyes: PERRL, EOMI, no scleral icterus.       ENT: No discharge from the ears and nose, no pharynx injection, no tonsillar enlargement.        Neck: No JVD, no bruit, no mass felt. Heme: No neck lymph node enlargement. Cardiac: S1/S2, RRR, No murmurs, No gallops or rubs. Respiratory: No rales, wheezing, rhonchi or rubs. GI: Soft, nondistended, nontender, no rebound pain, no organomegaly, BS present. GU: No hematuria Ext: No pitting leg edema bilaterally. 2+DP/PT pulse bilaterally. Musculoskeletal: No joint deformities, No joint redness or warmth, no limitation of ROM in spin. Skin: No rashes.  Neuro: Alert, oriented X3, cranial nerves II-XII grossly intact. Muscle strength 4/5 in right hand and 5/5 in other extremities, sensation to light touch intact. Brachial reflex 2+ bilaterally. Negative Babinski's sign. Psych:  Patient is not psychotic, no suicidal or hemocidal ideation.  Labs on Admission: I have personally reviewed following labs and imaging studies  CBC: Recent Labs  Lab 01/07/19 1341  WBC 7.1  NEUTROABS 4.9  HGB 11.7*  HCT 37.1  MCV 90.0  PLT 296   Basic Metabolic Panel: Recent Labs  Lab 01/07/19 1341  NA 138  K 4.0  CL 108  CO2 21*  GLUCOSE 176*  BUN 16  CREATININE 1.32*  CALCIUM 8.7*   GFR: Estimated Creatinine Clearance: 58 mL/min (A) (by C-G formula based on SCr of 1.32 mg/dL (H)). Liver Function Tests: Recent Labs  Lab 01/07/19 1341  AST 31  ALT 31  ALKPHOS 105  BILITOT 0.6  PROT 6.9  ALBUMIN 3.5   No results for input(s): LIPASE, AMYLASE in the last 168 hours. No results for input(s): AMMONIA in the last 168 hours. Coagulation Profile: No results for input(s): INR, PROTIME in the last 168 hours. Cardiac Enzymes: No results for input(s): CKTOTAL, CKMB, CKMBINDEX, TROPONINI in the last 168 hours. BNP (last 3 results) No results for input(s): PROBNP in the last 8760 hours. HbA1C: No results for input(s): HGBA1C in the last 72 hours. CBG: No results for input(s): GLUCAP in the last 168 hours. Lipid Profile: No results for input(s): CHOL, HDL, LDLCALC, TRIG, CHOLHDL, LDLDIRECT in the last 72 hours. Thyroid Function Tests: No results for input(s): TSH, T4TOTAL, FREET4, T3FREE, THYROIDAB in the last 72 hours. Anemia Panel: No results for input(s): VITAMINB12, FOLATE, FERRITIN, TIBC, IRON, RETICCTPCT in the last 72 hours. Urine analysis:    Component Value Date/Time   COLORURINE YELLOW 01/02/2016 1946   APPEARANCEUR CLEAR 01/02/2016 1946   LABSPEC 1.016 01/02/2016 1946   PHURINE 5.0 01/02/2016 1946   GLUCOSEU 100 (A) 01/02/2016 1946   HGBUR NEGATIVE 01/02/2016 1946   BILIRUBINUR NEGATIVE 01/02/2016 1946   KETONESUR NEGATIVE 01/02/2016 1946   PROTEINUR NEGATIVE 01/02/2016 1946   NITRITE NEGATIVE 01/02/2016 1946   LEUKOCYTESUR NEGATIVE 01/02/2016 1946     Sepsis Labs: @LABRCNTIP (procalcitonin:4,lacticidven:4) )No results found for this or any previous visit (from the past 240 hour(s)).   Radiological Exams on Admission: Ct Head Wo Contrast  Result Date:  01/07/2019 CLINICAL DATA:  Right hand weakness since last night. Remote history infarctions. EXAM: CT HEAD WITHOUT CONTRAST TECHNIQUE: Contiguous axial images were obtained from the base of the skull through the vertex without intravenous contrast. COMPARISON:  Brain MRI and CT head 01/02/2016 FINDINGS: Brain: Stable encephalomalacia changes involving the left temporoparietal region related to a prior MCA territory infarct. No CT findings to suggest a new hemispheric infarction or intracranial hemorrhage. No extra-axial fluid collections are identified. The gray-white differentiation is maintained. The ventricles are in the midline without mass effect or shift and are stable in configuration when compared to prior study. The brainstem and cerebellum appear unremarkable and stable. Vascular: Stable vascular calcifications without definite aneurysm or hyperdense vessels. Skull: No skull fracture or bone lesions. Sinuses/Orbits: The paranasal sinuses and mastoid air cells are clear. The globes are intact. Other: No scalp lesions or scalp hematoma. IMPRESSION: 1. Remote left temporoparietal infarct. 2. No acute intracranial findings or mass lesions. Electronically Signed   By: Marijo Sanes M.D.   On: 01/07/2019 14:01   Mr Brain Wo Contrast  Result Date: 01/07/2019 CLINICAL DATA:  Right hand weakness beginning last night. EXAM: MRI HEAD WITHOUT CONTRAST TECHNIQUE: Multiplanar, multiecho pulse sequences of the brain and surrounding structures were obtained without intravenous contrast. COMPARISON:  01/02/2016 FINDINGS: Brain: Diffusion imaging shows a few scattered punctate infarctions in the left posterior frontal region, probably affecting the precentral gyrus region and responsible for the clinical  presentation. No large confluent infarction. No mass lesion, hemorrhage, hydrocephalus or extra-axial collection. Elsewhere, brainstem and cerebellum are normal. There is old lacunar infarction in the left thalamus. There is old left middle cerebral artery territory branch vessel infarction affecting the superior temporal lobe, deep insula and temporoparietal junction region. Vascular: Major vessels at the base of the brain show flow. Skull and upper cervical spine: Negative Sinuses/Orbits: Clear/normal Other: None IMPRESSION: Several scattered punctate acute infarctions in the left posterior frontal region, possibly affecting the precentral gyrus. No swelling or hemorrhage. No large confluent acute infarction. Old infarction affecting the left upper temporal lobe, deep insula and temporoparietal junction consistent with old left MCA branch vessel territory infarction. Electronically Signed   By: Nelson Chimes M.D.   On: 01/07/2019 20:59   Mr Cervical Spine Wo Contrast  Result Date: 01/07/2019 CLINICAL DATA:  Acute presentation with right hand weakness beginning last night. EXAM: MRI CERVICAL SPINE WITHOUT CONTRAST TECHNIQUE: Multiplanar, multisequence MR imaging of the cervical spine was performed. No intravenous contrast was administered. COMPARISON:  None. FINDINGS: Alignment: Normal Vertebrae: Normal Cord: Normal Posterior Fossa, vertebral arteries, paraspinal tissues: Normal Disc levels: No significant degenerative change, stenosis or neural compression in the cervical region. There are ordinary mild non-compressive disc bulges from C2-3 through C7-T1, but no compressive narrowing of the canal or foramina. No facet arthropathy. IMPRESSION: Ordinary mild non-compressive cervical degenerative changes as above. No canal or foraminal stenosis explain the patient's right hand weakness. Electronically Signed   By: Nelson Chimes M.D.   On: 01/07/2019 21:01     EKG: Independently reviewed.  Sinus rhythm, QTC 456,  LAE, LAD, poor R wave progression  Assessment/Plan Principal Problem:   Stroke [facility]) Active Problems:   Dyslipidemia   Anemia   Essential hypertension   Type 2 diabetes mellitus with hyperglycemia, with long-term current use of insulin (HCC)   Hyperthyroidism   AKI (acute kidney injury) ([place])   Stroke [facility]):   MRI showed cluster of 3 punctate acute infarctions in the left MCA territory. Dr.  Lindzen of neuro was consulted.   -will place on tele bed for obs -highly appreciated Dr. Shelbie HutchingLindzen's consultation, will follow up recommendations as follows  Plan: 1. HgbA1c, fasting lipid panel 2. MRA  of the brain without contrast 3. PT consult, OT consult, Speech consult 4. Echocardiogram 5. Carotid dopplers 6. Prophylactic therapy- Add ASA to Plavix. Continue atorvastatin. --> pt is allergic to Excedrin, not sure if she can tolerated ASA,not start now 7. Risk factor modification 8. Telemetry monitoring 9. Frequent neuro checks  Dyslipidemia: -lipitor  Anemia: hgb 11.7 -f/u by [facility]  Essential hypertension: -Allow permissive hypertension - IV hydralazine for SBP> 220, DBP> 120  Type 2 diabetes mellitus with hyperglycemia, with long-term current use of insulin (HCC): Last A1c 9.9 on 01/03/16, poorly controled. Patient is taking Metformin, Januvia, Levemir at home -will decrease Levemir dose from 48 to 30 units daily -SSI  Hyperthyroidism: -Continue methimazole  AKI (acute kidney injury) (HCC): -hold HCTZ and ramipril -IV normal saline at 75 cc/h   DVT ppx:   SQ Lovenox Code Status: Full code Family Communication: None at bed side.   Disposition Plan:  Anticipate discharge back to previous home environment Consults called:  none Admission status: Obs / tele         Date of Service 01/07/2019    Lorretta HarpXilin Vibhav Waddill [facility]   If 7PM-7AM, please contact night-coverage [email or web address] Password [email or web address] 01/07/2019, 11:16 PM

## 2019-01-07 NOTE — Telephone Encounter (Signed)
I spoke with pt to let her know we did indeed get her transmission. I informed her per Raquel Sarna, RN note that her transmission was normal. The pt verbalized understanding.

## 2019-01-07 NOTE — ED Triage Notes (Signed)
Pt states she is having right hand weakness since last night.  Pt denies numbness or tingling but feels like she cannot grip very well.  Pt states she has been having some bilateral numbness upon waking for a while.  Denies slurred speech.  Pt admits to tingling in both feet.

## 2019-01-07 NOTE — ED Notes (Signed)
Patient returned from MRI.

## 2019-01-07 NOTE — ED Notes (Signed)
[facility] arrived to transport pt to [facility] 

## 2019-01-07 NOTE — Consult Note (Signed)
Referring Physician: Dr. Jacqulyn BathLong    Chief Complaint: Right hand weakness  HPI: Catherine Bautista is an 68 y.o. female who initially presented to the [facility] ED this afternoon with recurrent right handed weakness since Saturday, having trouble with her grip, including dropping things at the grocery store initially. This resolved but symptoms then recurred while at work yesterday with sensation of heaviness on picking things up as well as difficulty gripping her mouse at work. She denied sensory symptoms in that distribution, but endorsed bilateral numbness on awakening this AM, which then resolved. Of note, she has a history of a left MCA territory ischemic infarction, which also manifested with right hand weakness. She has not had slurred speech.   She has a history of 3 strokes in the past, the first with speech change and right sided weakness, the second with left sided weakness and the third with RUE weakness.   [facility] for [facility] assessment. [facility] this evening reveals a cluster of 3 punctate acute infarctions in the left MCA territory. Last stroke work up was in 2017.  MRI brain: 1. Several scattered punctate acute infarctions in the left posterior frontal region, possibly affecting the precentral gyrus. No swelling or hemorrhage. No large confluent acute infarction. 2. Old infarction affecting the left upper temporal lobe, deep insula and temporoparietal junction consistent with old left MCA branch vessel territory infarction.   Past Medical History:  Diagnosis Date  . Anemia   . Anemia, vitamin B12 deficiency   . Depression with anxiety   . Diabetes mellitus   . Dyslipidemia   . Fibromyalgia   . Graves disease   . Hypertension   . Obesity   . Stroke (HCC)    "I've had 3 strokes"    Past Surgical History:  Procedure Laterality Date  . ABDOMINAL HYSTERECTOMY  2007  . CHOLECYSTECTOMY     laproscopic  . EP IMPLANTABLE  DEVICE N/A 01/05/2016   Procedure: Loop Recorder Insertion;  Surgeon: Will Jorja LoaMartin Camnitz, MD;  Location: [facility] INVASIVE CV LAB;  Service: Cardiovascular;  Laterality: N/A;  . GASTRIC BYPASS  1981  . TEE WITHOUT CARDIOVERSION N/A 01/05/2016   Procedure: TRANSESOPHAGEAL ECHOCARDIOGRAM (TEE);  Surgeon: Quintella Reichertraci R Turner, MD;  [facility];  Service: Cardiovascular;  Laterality: N/A;  . TEE WITHOUT CARDIOVERSION N/A 04/18/2017   Procedure: TRANSESOPHAGEAL ECHOCARDIOGRAM (TEE);  Surgeon: Lewayne Buntingrenshaw, Brian S, MD;  [facility];  Service: Cardiovascular;  Laterality: N/A;  . therapuetic abortion  1986   associated with C-section    Family History  Problem Relation Age of Onset  . Stroke Mother   . Heart failure Mother   . Lung cancer Father   . Kidney cancer Brother   . Stroke Cousin    Social History:  reports that she has never smoked. She has never used smokeless tobacco. She reports that she does not drink alcohol or use drugs.  Allergies:  Allergies  Allergen Reactions  . Demerol Nausea And Vomiting  . Excedrin Extra Strength [Aspirin-Acetaminophen-Caffeine] Other (See Comments)    Weird feeling  . Invokana [Canagliflozin] Other (See Comments)    Decreasing renal function and extremely lethargic  . Janumet [Sitagliptin-Metformin Hcl] Other (See Comments)    Bloating and gas  . Meperidine Other (See Comments)  . Morphine And Related Other (See Comments)    Weird feeling  . Novocain [Procaine Hcl] Nausea And Vomiting    Per pt "I  never had a problem with lidocaine, just novocain."  . Procaine Other (See Comments)    Medications:  Prior to Admission:  Medications Prior to Admission  Medication Sig Dispense Refill Last Dose  . atorvastatin (LIPITOR) 40 MG tablet Take 1 tablet (40 mg total) by mouth daily at 6 PM. (Patient taking differently: Take 40 mg by mouth at bedtime. ) 30 tablet 2 01/06/2019 at pm  . b complex vitamins tablet Take 1 tablet by mouth daily. Reported on  01/13/2016   01/07/2019 at am  . Cholecalciferol (VITAMIN D3) 50 MCG (2000 UT) TABS Take 2,000 Units by mouth daily.   01/07/2019 at Unknown time  . clopidogrel (PLAVIX) 75 MG tablet TAKE 1 TABLET BY MOUTH EVERY DAY (Patient taking differently: Take 75 mg by mouth daily. ) 90 tablet 3 01/07/2019 at 0900  . DULoxetine (CYMBALTA) 30 MG capsule Take 30 mg by mouth daily.   4 01/02/2019 at Unknown time  . ferrous sulfate 325 (65 FE) MG tablet Take 325 mg by mouth daily with breakfast.   01/07/2019 at am  . glimepiride (AMARYL) 4 MG tablet Take 4 mg by mouth daily with breakfast.   01/07/2019 at am  . hydrochlorothiazide (HYDRODIURIL) 25 MG tablet Take 25 mg by mouth daily.     01/07/2019 at Unknown time  . Insulin Detemir (LEVEMIR FLEXTOUCH) 100 UNIT/ML Pen Inject 48 Units into the skin at bedtime.   01/06/2019 at pm  . JANUVIA 100 MG tablet Take 100 mg by mouth daily.  2 01/07/2019 at Unknown time  . Magnesium 300 MG CAPS Take 300 mg by mouth daily.   01/07/2019 at Unknown time  . metFORMIN (GLUCOPHAGE-XR) 750 MG 24 hr tablet Take 750 mg by mouth 2 (two) times a day.   01/07/2019 at am  . methimazole (TAPAZOLE) 10 MG tablet Take 10 mg by mouth daily.   5 01/07/2019 at am  . Multiple Vitamins-Minerals (HAIR/SKIN/NAILS) CAPS Take 1 capsule by mouth daily.    01/07/2019 at am  . OMEGA 3 1200 MG CAPS Take 1,200 mg by mouth at bedtime.    01/06/2019 at pm  . ramipril (ALTACE) 10 MG capsule Take 10 mg by mouth daily.     01/07/2019 at Unknown time  . vitamin B-12 (CYANOCOBALAMIN) 1000 MCG tablet Take 1,000 mcg by mouth daily.    01/07/2019 at am  . vitamin C (ASCORBIC ACID) 500 MG tablet Take 500 mg by mouth daily.   01/07/2019 at Unknown time   Scheduled: . atorvastatin  40 mg Oral QHS  . B-complex with vitamin C  1 tablet Oral Daily  . cholecalciferol  2,000 Units Oral Daily  . clopidogrel  75 mg Oral Daily  . enoxaparin (LOVENOX) injection  40 mg Subcutaneous Q24H  . ferrous sulfate  325 mg Oral Q breakfast  . insulin aspart  0-9  Units Subcutaneous TID WC  . insulin detemir  30 Units Subcutaneous QHS  . magnesium oxide  200 mg Oral Daily  . methimazole  10 mg Oral Daily  . omega-3 acid ethyl esters  1,000 mg Oral QHS  . vitamin B-12  1,000 mcg Oral Daily  . vitamin C  500 mg Oral Daily    ROS: Positive for bilateral foot tingling. No vision changes, facial droop, speech changes or trouble walking. Had an episode of bilateral hand shaking last week when holding a phone. No CP, SOB, presyncope or dizziness. Has had some insomnia. Otherwise per HPI with all other systems negative.  Physical Examination: Blood pressure (!) 148/60, pulse (!) 58, temperature 97.8 F (36.6 C), temperature source Oral, resp. rate 14, height  (1.676 m), weight 130.2 kg, SpO2 99 %.  HEENT: [place]/AT Lungs: Respirations unlabored Ext: No edema  Neurologic Examination: Mental Status:  Alert, fully oriented, thought content appropriate.  Speech fluent without evidence of aphasia or dysarthria. Able to follow all commands without difficulty. Cranial Nerves: II:  Visual fields intact with no extinction to DSS. PERRL III,IV, VI: EOMI with mild saccadic quality to visual pursuits noted. No ptosis V,VII: Smile symmetric, facial temp sensation decreased on the left VIII: hearing intact to voice IX,X: No hypophonia XI: Subtle lag on the right with shoulder shrug XII: midline tongue extension  Motor: LUE, LLE and RLE 5/5 RUE with 5/5 deltoid and triceps, 4+/5 biceps, 4/5 grip strength No pronator drift.  Sensory: Temp sensation decreased to LUE. FT intact throughout, bilaterally. No extinction to DSS.  Deep Tendon Reflexes:  1+ bilateral biceps and brachioradialis Unelicitable patellar and achilles reflexes Plantars: Right: downgoing   Left: downgoing Cerebellar: Subtle dysmetria with FNF on the right. Left FNF normal.  Gait: Deferred  Results for orders placed or performed during the hospital encounter of 01/07/19 (from the past 48  hour(s))  CBC with Differential     Status: Abnormal   Collection Time: 01/07/19  1:41 PM  Result Value Ref Range   WBC 7.1 4.0 - 10.5 K/uL   RBC 4.12 3.87 - 5.11 MIL/uL   Hemoglobin 11.7 (L) 12.0 - 15.0 g/dL   HCT 04.5 40.9 - 81.1 %   MCV 90.0 80.0 - 100.0 fL   MCH 28.4 26.0 - 34.0 pg   MCHC 31.5 30.0 - 36.0 g/dL   RDW 91.4 78.2 - 95.6 %   Platelets 296 150 - 400 K/uL   nRBC 0.0 0.0 - 0.2 %   Neutrophils Relative % 69 %   Neutro Abs 4.9 1.7 - 7.7 K/uL   Lymphocytes Relative 23 %   Lymphs Abs 1.7 0.7 - 4.0 K/uL   Monocytes Relative 7 %   Monocytes Absolute 0.5 0.1 - 1.0 K/uL   Eosinophils Relative 1 %   Eosinophils Absolute 0.0 0.0 - 0.5 K/uL   Basophils Relative 0 %   Basophils Absolute 0.0 0.0 - 0.1 K/uL   Immature Granulocytes 0 %   Abs Immature Granulocytes 0.01 0.00 - 0.07 K/uL    Comment: Performed at [facility], [address] [place], [place] [address]  Comprehensive metabolic panel     Status: Abnormal   Collection Time: 01/07/19  1:41 PM  Result Value Ref Range   Sodium 138 135 - 145 mmol/L   Potassium 4.0 3.5 - 5.1 mmol/L   Chloride 108 98 - 111 mmol/L   CO2 21 (L) 22 - 32 mmol/L   Glucose, Bld 176 (H) 70 - 99 mg/dL   BUN 16 8 - 23 mg/dL   Creatinine, Ser 6.57 (H) 0.44 - 1.00 mg/dL   Calcium 8.7 (L) 8.9 - 10.3 mg/dL   Total Protein 6.9 6.5 - 8.1 g/dL   Albumin 3.5 3.5 - 5.0 g/dL   AST 31 15 - 41 U/L   ALT 31 0 - 44 U/L   Alkaline Phosphatase 105 38 - 126 U/L   Total Bilirubin 0.6 0.3 - 1.2 mg/dL   GFR calc non Af Amer 42 (L) >60 mL/min   GFR calc Af Amer 49 (L) >60 mL/min   Anion gap 9  5 - 15    Comment: Performed at [facility], [address] [place], [place] [id number]   Ct Head Wo Contrast  Result Date: 01/07/2019 CLINICAL DATA:  Right hand weakness since last night. Remote history infarctions. EXAM: CT HEAD WITHOUT CONTRAST TECHNIQUE: Contiguous axial images were obtained from the base of the skull through the vertex without  intravenous contrast. COMPARISON:  Brain MRI and CT head 01/02/2016 FINDINGS: Brain: Stable encephalomalacia changes involving the left temporoparietal region related to a prior MCA territory infarct. No CT findings to suggest a new hemispheric infarction or intracranial hemorrhage. No extra-axial fluid collections are identified. The gray-white differentiation is maintained. The ventricles are in the midline without mass effect or shift and are stable in configuration when compared to prior study. The brainstem and cerebellum appear unremarkable and stable. Vascular: Stable vascular calcifications without definite aneurysm or hyperdense vessels. Skull: No skull fracture or bone lesions. Sinuses/Orbits: The paranasal sinuses and mastoid air cells are clear. The globes are intact. Other: No scalp lesions or scalp hematoma. IMPRESSION: 1. Remote left temporoparietal infarct. 2. No acute intracranial findings or mass lesions. Electronically Signed   By: Rudie MeyerP.  Gallerani M.D.   On: 01/07/2019 14:01   Mr Brain Wo Contrast  Result Date: 01/07/2019 CLINICAL DATA:  Right hand weakness beginning last night. EXAM: MRI HEAD WITHOUT CONTRAST TECHNIQUE: Multiplanar, multiecho pulse sequences of the brain and surrounding structures were obtained without intravenous contrast. COMPARISON:  01/02/2016 FINDINGS: Brain: Diffusion imaging shows a few scattered punctate infarctions in the left posterior frontal region, probably affecting the precentral gyrus region and responsible for the clinical presentation. No large confluent infarction. No mass lesion, hemorrhage, hydrocephalus or extra-axial collection. Elsewhere, brainstem and cerebellum are normal. There is old lacunar infarction in the left thalamus. There is old left middle cerebral artery territory branch vessel infarction affecting the superior temporal lobe, deep insula and temporoparietal junction region. Vascular: Major vessels at the base of the brain show flow. Skull  and upper cervical spine: Negative Sinuses/Orbits: Clear/normal Other: None IMPRESSION: Several scattered punctate acute infarctions in the left posterior frontal region, possibly affecting the precentral gyrus. No swelling or hemorrhage. No large confluent acute infarction. Old infarction affecting the left upper temporal lobe, deep insula and temporoparietal junction consistent with old left MCA branch vessel territory infarction. Electronically Signed   By: Paulina FusiMark  Shogry M.D.   On: 01/07/2019 20:59   Mr Cervical Spine Wo Contrast  Result Date: 01/07/2019 CLINICAL DATA:  Acute presentation with right hand weakness beginning last night. EXAM: MRI CERVICAL SPINE WITHOUT CONTRAST TECHNIQUE: Multiplanar, multisequence MR imaging of the cervical spine was performed. No intravenous contrast was administered. COMPARISON:  None. FINDINGS: Alignment: Normal Vertebrae: Normal Cord: Normal Posterior Fossa, vertebral arteries, paraspinal tissues: Normal Disc levels: No significant degenerative change, stenosis or neural compression in the cervical region. There are ordinary mild non-compressive disc bulges from C2-3 through C7-T1, but no compressive narrowing of the canal or foramina. No facet arthropathy. IMPRESSION: Ordinary mild non-compressive cervical degenerative changes as above. No canal or foraminal stenosis explain the patient's right hand weakness. Electronically Signed   By: Paulina FusiMark  Shogry M.D.   On: 01/07/2019 21:01    Assessment: 68 y.o. female presenting with punctate acute infarctions in the left posterior frontal region 1. Exam reveals mild RUE weakness, which correlates with the acute MRI findings. 2. MRI brain reveals several scattered punctate acute infarctions in the left posterior frontal region, possibly affecting the precentral gyrus. Also noted  is an old infarction affecting the left upper temporal lobe, deep insula and temporoparietal junction consistent with old left MCA branch vessel territory  infarction.  3. Stroke Risk Factors - DM, dyslipidemia, HTN, obesity and prior strokes Classifiable as having failed Plavix monotherapy.   Plan: 1. HgbA1c, fasting lipid panel 2. MRA  of the brain without contrast 3. PT consult, OT consult, Speech consult 4. Echocardiogram 5. Carotid dopplers 6. Prophylactic therapy- Add ASA to Plavix. Continue atorvastatin.  7. Risk factor modification 8. Telemetry monitoring 9. Frequent neuro checks   @Electronically  signed: Dr. Kerney Elbe 01/07/2019, 9:29 PM

## 2019-01-07 NOTE — ED Provider Notes (Signed)
[facility] EMERGENCY DEPARTMENT Provider Note   CSN: [id number] Arrival date & time: 01/07/19  1144    History   Chief Complaint Chief Complaint  Patient presents with  . Extremity Weakness    right hand    HPI Catherine Bautista is a 68 y.o. female.     HPI   Past stroke with right hand weakness Saturday had episode of right hand weakness, dropping things at the grocery store but then improved while she was still shopping Then yesterday while at work felt like she was having trouble gripping her mouse and lifting pen with her right side  Last night went to bed, and 4AM woke up and picked up phone but felt a little heavy.  Went to computer and worked on things for class, but felt like lower arm had weakness.    No symptoms other than lower right hand weakness. Bilateral toe tingling, not sure from sitting a lot, began while driving here.  No difficulty walking or talking, no change in vision, no drooping of the face.   Last week had an episode where right hand shaking, but then switched phone to left hand and that one shaked too.  Every morning wakes up and hands numb, has hx of carpal tunnel but has not had issues with it in 10 years.  Has loop recorder in.  No dizziness, no lightheadedness, no chest pain or shortnetss of breath BP 140s    Has not had cymbalta for last 3-4 days, has been not been sleeping as much, retiring in sept and trying to finish a lot of work  1st stroke had difficulty speaking and right weakness 2nd was left sided weakness 3rd stroke weakness of right arm  Past Medical History:  Diagnosis Date  . Anemia   . Anemia, vitamin B12 deficiency   . Depression with anxiety   . Diabetes mellitus   . Dyslipidemia   . Fibromyalgia   . Graves disease   . Hypertension   . Obesity   . Stroke (HCC)    "I've had 3 strokes"    Patient Active Problem List   Diagnosis Date Noted  . Hyperthyroidism 06/08/2017  . History of stroke 03/23/2016  . Type  2 diabetes mellitus with hyperglycemia, with long-term current use of insulin (HCC)   . Essential hypertension   . Cerebrovascular accident (CVA) due to embolism of left middle cerebral artery (HCC)   . Acute CVA (cerebrovascular accident) (HCC) 01/03/2016  . Dyslipidemia   . Hypertension   . Stroke (HCC)   . Anemia   . Depression with anxiety   . Right arm weakness   . Sleep apnea 01/31/2012  . Anemia, unspecified 05/21/2011    Past Surgical History:  Procedure Laterality Date  . ABDOMINAL HYSTERECTOMY  2007  . CHOLECYSTECTOMY     laproscopic  . EP IMPLANTABLE DEVICE N/A 01/05/2016   Procedure: Loop Recorder Insertion;  Surgeon: Will Jorja LoaMartin Camnitz, MD;  Location: [facility] INVASIVE CV LAB;  Service: Cardiovascular;  Laterality: N/A;  . GASTRIC BYPASS  1981  . TEE WITHOUT CARDIOVERSION N/A 01/05/2016   Procedure: TRANSESOPHAGEAL ECHOCARDIOGRAM (TEE);  Surgeon: Quintella Reichertraci R Turner, MD;  [facility];  Service: Cardiovascular;  Laterality: N/A;  . TEE WITHOUT CARDIOVERSION N/A 04/18/2017   Procedure: TRANSESOPHAGEAL ECHOCARDIOGRAM (TEE);  Surgeon: Lewayne Buntingrenshaw, Brian S, MD;  [facility];  Service: Cardiovascular;  Laterality: N/A;  . therapuetic abortion  1986   associated with C-section     OB History  No obstetric history on file.      Home Medications    Prior to Admission medications   Medication Sig Start Date End Date Taking? Authorizing Provider  atorvastatin (LIPITOR) 40 MG tablet Take 1 tablet (40 mg total) by mouth daily at 6 PM. 01/06/16   Elgergawy, Silver Huguenin, MD  B Complex Vitamins (VITAMIN B COMPLEX PO) Take by mouth.    [provider]  b complex vitamins tablet Take 1 tablet by mouth daily. Reported on 01/13/2016    [provider]  Cholecalciferol (D 5000) 5000 units capsule Take by mouth.    [provider]  clopidogrel (PLAVIX) 75 MG tablet TAKE 1 TABLET BY MOUTH EVERY DAY 01/16/18   Star Age, MD  DULoxetine (CYMBALTA) 30 MG  capsule Take by mouth daily. 11/30/17   [provider]  glimepiride (AMARYL) 1 MG tablet Take 4 mg by mouth daily with breakfast.  12/23/15   [provider]  hydrochlorothiazide (HYDRODIURIL) 25 MG tablet Take 25 mg by mouth daily.      [provider]  Insulin Detemir (LEVEMIR FLEXPEN [place]) Inject 48 Units into the skin at bedtime.     [provider]  IRON PO Take 65 mg by mouth.    [provider]  JANUVIA 100 MG tablet Take 100 mg by mouth daily. 11/19/15   [provider]  MAGNESIUM PO Take by mouth daily.    [provider]  Melatonin 5 MG TABS Take by mouth.    [provider]  metFORMIN (GLUCOPHAGE) 500 MG tablet Take 500 mg by mouth 2 (two) times daily with a meal.    [provider]  methimazole (TAPAZOLE) 10 MG tablet Take 20 mg by mouth daily. 07/30/17   [provider]  multivitamin [facility]) per tablet Take 1 tablet by mouth daily.      [provider]  OMEGA 3 1200 MG CAPS Take 1,200 mg by mouth at bedtime.     [provider]  ramipril (ALTACE) 10 MG capsule Take 10 mg by mouth daily.      [provider]  SITagliptin-MetFORMIN HCl (JANUMET PO) Take 500 mg by mouth 2 (two) times daily. Reported on 01/13/2016    [provider]  triamcinolone lotion (KENALOG) 0.1 % APPLY TO AFFECTED AREA TWICE A DAY FOR 5 DAYS 01/23/18   [provider]  vitamin B-12 (CYANOCOBALAMIN) 1000 MCG tablet Take by mouth.    [provider]    Family History Family History  Problem Relation Age of Onset  . Stroke Mother   . Heart failure Mother   . Lung cancer Father   . Kidney cancer Brother   . Stroke Cousin     Social History Social History   Tobacco Use  . Smoking status: Never Smoker  . Smokeless tobacco: Never Used  Substance Use Topics  . Alcohol use: No  . Drug use: No     Allergies   Demerol; Excedrin extra strength  [aspirin-acetaminophen-caffeine]; Invokana [canagliflozin]; Janumet [sitagliptin-metformin hcl]; Meperidine; Morphine and related; Novocain [procaine hcl]; and Procaine   Review of Systems Review of Systems  Constitutional: Negative for fever.  HENT: Negative for sore throat.   Eyes: Negative for visual disturbance.  Respiratory: Negative for cough and shortness of breath.   Cardiovascular: Negative for chest pain.  Gastrointestinal: Negative for abdominal pain, diarrhea, nausea and vomiting.  Genitourinary: Negative for difficulty urinating.  Musculoskeletal: Negative for back pain and neck pain.  Skin:  Negative for rash.  Neurological: Positive for weakness. Negative for syncope, facial asymmetry, speech difficulty, numbness and headaches.     Physical Exam Updated Vital Signs BP 126/60 (BP Location: Right Arm)   Pulse 83   Temp 97.8 F (36.6 C) (Oral)   Resp 18   Ht 5\' 6"  (1.676 m)   Wt 130.2 kg   SpO2 100%   BMI 46.32 kg/m   Physical Exam Vitals signs and nursing note reviewed.  Constitutional:      General: She is not in acute distress.    Appearance: She is well-developed. She is not diaphoretic.  HENT:     Head: Normocephalic and atraumatic.  Eyes:     General: No visual field deficit.    Conjunctiva/sclera: Conjunctivae normal.  Neck:     Musculoskeletal: Normal range of motion.  Cardiovascular:     Rate and Rhythm: Normal rate and regular rhythm.     Heart sounds: Normal heart sounds. No murmur. No friction rub. No gallop.   Pulmonary:     Effort: Pulmonary effort is normal. No respiratory distress.     Breath sounds: Normal breath sounds. No wheezing or rales.  Abdominal:     General: There is no distension.     Palpations: Abdomen is soft.     Tenderness: There is no abdominal tenderness. There is no guarding.  Musculoskeletal:        General: No tenderness.  Skin:    General: Skin is warm and dry.     Findings: No erythema or rash.  Neurological:      Mental Status: She is alert and oriented to person, place, and time.     Cranial Nerves: No cranial nerve deficit, dysarthria or facial asymmetry.     Sensory: No sensory deficit.     Motor: Motor function is intact. Weakness: questionable right grip weakness.     Coordination: Coordination is intact. Coordination normal. Finger-Nose-Finger Test normal.      ED Treatments / Results  Labs (all labs ordered are listed, but only abnormal results are displayed) Labs Reviewed  CBC WITH DIFFERENTIAL/PLATELET - Abnormal; Notable for the following components:      Result Value   Hemoglobin 11.7 (*)    All other components within normal limits  COMPREHENSIVE METABOLIC PANEL - Abnormal; Notable for the following components:   CO2 21 (*)    Glucose, Bld 176 (*)    Creatinine, Ser 1.32 (*)    Calcium 8.7 (*)    GFR calc non Af Amer 42 (*)    GFR calc Af Amer 49 (*)    All other components within normal limits    EKG EKG Interpretation  Date/Time:  Monday January 07 2019 14:12:46 EDT Ventricular Rate:  72 PR Interval:    QRS Duration: 125 QT Interval:  416 QTC Calculation: 456 R Axis:   -67 Text Interpretation:  Sinus rhythm Nonspecific IVCD with LAD Left ventricular hypertrophy No significant change since last tracing Confirmed by Alvira MondaySchlossman, Khyleigh Furney ([id number]) on 01/07/2019 4:45:36 PM   Radiology Ct Head Wo Contrast  Result Date: 01/07/2019 CLINICAL DATA:  Right hand weakness since last night. Remote history infarctions. EXAM: CT HEAD WITHOUT CONTRAST TECHNIQUE: Contiguous axial images were obtained from the base of the skull through the vertex without intravenous contrast. COMPARISON:  Brain MRI and CT head 01/02/2016 FINDINGS: Brain: Stable encephalomalacia changes involving the left temporoparietal region related to a prior MCA territory infarct. No CT findings to suggest a new hemispheric infarction  or intracranial hemorrhage. No extra-axial fluid collections are identified. The  gray-white differentiation is maintained. The ventricles are in the midline without mass effect or shift and are stable in configuration when compared to prior study. The brainstem and cerebellum appear unremarkable and stable. Vascular: Stable vascular calcifications without definite aneurysm or hyperdense vessels. Skull: No skull fracture or bone lesions. Sinuses/Orbits: The paranasal sinuses and mastoid air cells are clear. The globes are intact. Other: No scalp lesions or scalp hematoma. IMPRESSION: 1. Remote left temporoparietal infarct. 2. No acute intracranial findings or mass lesions. Electronically Signed   By: Rudie MeyerP.  Gallerani M.D.   On: 01/07/2019 14:01    Procedures Procedures (including critical care time)  Medications Ordered in ED Medications - No data to display   Initial Impression / Assessment and Plan / ED Course  I have reviewed the triage vital signs and the nursing notes.  Pertinent labs & imaging results that were available during my care of the patient were reviewed by me and considered in my medical decision making (see chart for details).        68 year old female with a history of multiple CVAs, hypertension, hyperlipidemia, diabetes, presents with concern for right hand weakness.  Reports one episode last week, and more continuous however waxing and waning symptoms since yesterday, tingling bilateral feet today.  She denies any other acute neurologic symptoms---notes waking with bilateral hand numbness at times.  DDx includes CVA, carpal tunnel, cervical spine disease.  Given more persistence of symptoms I doubt TIA.   CT head and labs here WNL.  [facility] brain and cervical spine. If negative, would recommend wrist splints and follow up with PCP, however if abnormal will admit for further care.  Dr. Criss AlvineGoldston accepting.  Final Clinical Impressions(s) / ED Diagnoses   Final diagnoses:  Right hand weakness    ED Discharge Orders    None        Alvira MondaySchlossman, Rashawd Laskaris, MD 01/07/19 1646

## 2019-01-07 NOTE — ED Provider Notes (Signed)
Blood pressure (!) 138/57, pulse 76, temperature 97.8 F (36.6 C), temperature source Oral, resp. rate 20, height 5\' 6"  (1.676 m), weight 130.2 kg, SpO2 99 %.  Patient arrives from [facility] for MRI. In short, Catherine Bautista is a 68 y.o. female with a chief complaint of Extremity Weakness (right hand) .  Refer to the original H&P for additional details.  The current plan of care is to f/u MRI and reassess.   09:25 PM  Spoke with Dr. Cheral Marker regarding the MRI. He will consult. Plan for admit with MRI findings of acute infarct. Discussed plan and results with the patient as well.   Discussed patient's case with Hospitalist, Dr. Blaine Hamper to request admission. Patient and family (if present) updated with plan. Care transferred to Hospitalist service.  I reviewed all nursing notes, vitals, pertinent old records, EKGs, labs, imaging (as available).   EKG Interpretation  Date/Time:  Monday January 07 2019 14:12:46 EDT Ventricular Rate:  72 PR Interval:    QRS Duration: 125 QT Interval:  416 QTC Calculation: 456 R Axis:   -67 Text Interpretation:  Sinus rhythm Nonspecific IVCD with LAD Left ventricular hypertrophy No significant change since last tracing Confirmed by Gareth Morgan [phone]) on 01/07/2019 4:45:36 PM         Catherine Bautista, Catherine Olds, MD 01/07/19 2215

## 2019-01-07 NOTE — ED Notes (Signed)
Pt called family 

## 2019-01-07 NOTE — ED Notes (Signed)
Report given to charge RN at [facility]

## 2019-01-07 NOTE — Telephone Encounter (Signed)
No symptom, tachy, pause, brady, or AF episodes detected on LINQ from 12/27/17-01/07/19. Presenting rhythm as of 01/07/19 at 04:50 appears NSR.

## 2019-01-08 ENCOUNTER — Observation Stay (HOSPITAL_COMMUNITY): Payer: BC Managed Care – PPO

## 2019-01-08 ENCOUNTER — Observation Stay (HOSPITAL_BASED_OUTPATIENT_CLINIC_OR_DEPARTMENT_OTHER): Payer: BC Managed Care – PPO

## 2019-01-08 ENCOUNTER — Encounter (HOSPITAL_COMMUNITY): Payer: BC Managed Care – PPO

## 2019-01-08 DIAGNOSIS — Z794 Long term (current) use of insulin: Secondary | ICD-10-CM | POA: Diagnosis not present

## 2019-01-08 DIAGNOSIS — Z7902 Long term (current) use of antithrombotics/antiplatelets: Secondary | ICD-10-CM | POA: Diagnosis not present

## 2019-01-08 DIAGNOSIS — R29898 Other symptoms and signs involving the musculoskeletal system: Secondary | ICD-10-CM

## 2019-01-08 DIAGNOSIS — E0590 Thyrotoxicosis, unspecified without thyrotoxic crisis or storm: Secondary | ICD-10-CM

## 2019-01-08 DIAGNOSIS — R29700 NIHSS score 0: Secondary | ICD-10-CM | POA: Diagnosis present

## 2019-01-08 DIAGNOSIS — E1165 Type 2 diabetes mellitus with hyperglycemia: Secondary | ICD-10-CM

## 2019-01-08 DIAGNOSIS — N179 Acute kidney failure, unspecified: Secondary | ICD-10-CM | POA: Diagnosis present

## 2019-01-08 DIAGNOSIS — M797 Fibromyalgia: Secondary | ICD-10-CM | POA: Diagnosis present

## 2019-01-08 DIAGNOSIS — Z801 Family history of malignant neoplasm of trachea, bronchus and lung: Secondary | ICD-10-CM | POA: Diagnosis not present

## 2019-01-08 DIAGNOSIS — I10 Essential (primary) hypertension: Secondary | ICD-10-CM | POA: Diagnosis present

## 2019-01-08 DIAGNOSIS — E785 Hyperlipidemia, unspecified: Secondary | ICD-10-CM

## 2019-01-08 DIAGNOSIS — I6340 Cerebral infarction due to embolism of unspecified cerebral artery: Secondary | ICD-10-CM | POA: Diagnosis present

## 2019-01-08 DIAGNOSIS — Z885 Allergy status to narcotic agent status: Secondary | ICD-10-CM | POA: Diagnosis not present

## 2019-01-08 DIAGNOSIS — Z9884 Bariatric surgery status: Secondary | ICD-10-CM | POA: Diagnosis not present

## 2019-01-08 DIAGNOSIS — E0500 Thyrotoxicosis with diffuse goiter without thyrotoxic crisis or storm: Secondary | ICD-10-CM | POA: Diagnosis present

## 2019-01-08 DIAGNOSIS — Z6841 Body Mass Index (BMI) 40.0 and over, adult: Secondary | ICD-10-CM | POA: Diagnosis not present

## 2019-01-08 DIAGNOSIS — Z8673 Personal history of transient ischemic attack (TIA), and cerebral infarction without residual deficits: Secondary | ICD-10-CM | POA: Diagnosis not present

## 2019-01-08 DIAGNOSIS — D649 Anemia, unspecified: Secondary | ICD-10-CM | POA: Diagnosis present

## 2019-01-08 DIAGNOSIS — Z823 Family history of stroke: Secondary | ICD-10-CM | POA: Diagnosis not present

## 2019-01-08 DIAGNOSIS — Z8051 Family history of malignant neoplasm of kidney: Secondary | ICD-10-CM | POA: Diagnosis not present

## 2019-01-08 DIAGNOSIS — Z20828 Contact with and (suspected) exposure to other viral communicable diseases: Secondary | ICD-10-CM | POA: Diagnosis present

## 2019-01-08 DIAGNOSIS — Z8249 Family history of ischemic heart disease and other diseases of the circulatory system: Secondary | ICD-10-CM | POA: Diagnosis not present

## 2019-01-08 DIAGNOSIS — I6300 Cerebral infarction due to thrombosis of unspecified precerebral artery: Secondary | ICD-10-CM

## 2019-01-08 DIAGNOSIS — I639 Cerebral infarction, unspecified: Secondary | ICD-10-CM

## 2019-01-08 DIAGNOSIS — Z886 Allergy status to analgesic agent status: Secondary | ICD-10-CM | POA: Diagnosis not present

## 2019-01-08 LAB — ECHOCARDIOGRAM COMPLETE
Height: 66 in
Weight: 4578.51 oz

## 2019-01-08 LAB — LIPID PANEL
Cholesterol: 107 mg/dL (ref 0–200)
HDL: 41 mg/dL (ref >40)
LDL Cholesterol: 56 mg/dL (ref 0–99)
Total CHOL/HDL Ratio: 2.6 RATIO
Triglycerides: 49 mg/dL (ref <150)
VLDL: 10 mg/dL (ref 0–40)

## 2019-01-08 LAB — HEMOGLOBIN A1C
Hgb A1c MFr Bld: 8.8 % — ABNORMAL HIGH (ref 4.8–5.6)
Mean Plasma Glucose: 205.86 mg/dL

## 2019-01-08 LAB — GLUCOSE, CAPILLARY
Glucose-Capillary: 162 mg/dL — ABNORMAL HIGH (ref 70–99)
Glucose-Capillary: 153 mg/dL — ABNORMAL HIGH (ref 70–99)

## 2019-01-08 LAB — HIV ANTIBODY (ROUTINE TESTING W REFLEX): HIV Screen 4th Generation wRfx: NONREACTIVE

## 2019-01-08 MED ORDER — HYDROCHLOROTHIAZIDE 25 MG PO TABS: 25.0000 mg | ORAL_TABLET | Freq: Every day | ORAL | Status: AC

## 2019-01-08 MED ORDER — ASPIRIN EC 81 MG PO TBEC: 81.0000 mg | DELAYED_RELEASE_TABLET | Freq: Every day | ORAL | 0 refills | Status: AC

## 2019-01-08 MED ORDER — IOHEXOL 350 MG/ML SOLN
75.0000 mL | Freq: Once | INTRAVENOUS | Status: AC | PRN
  Administered 2019-01-08: 75 mL via INTRAVENOUS

## 2019-01-08 NOTE — Evaluation (Signed)
Physical Therapy Evaluation Patient Details Name: Catherine Bautista MRN: [id number] DOB: 1950-12-19 Today's Date: 01/08/2019   History of Present Illness  Catherine Bautista is a 68 y.o. female with medical history significant of stroke x3, hypertension, hyperlipidemia, diabetes mellitus, depression with anxiety, Graves' disease, hypothyroidism, loop recorder placement, who presents with right hand weakness. MRI shower 3 punctate infarcts in the L MCA territory   Clinical Impression  Patient has no need for skilled physical therapy at this time. She was independent with high level balance exercises. She had no syncope or loss of balance while ambulating. She was able to turn he head and change speeds with gait. She would benefit from further skilled OT for her UE deficits.     Follow Up Recommendations No PT follow up    Equipment Recommendations    None    Recommendations for Other Services       Precautions / Restrictions Precautions Precautions: Fall Restrictions Weight Bearing Restrictions: No      Mobility  Bed Mobility Overal bed mobility: Modified Independent             General bed mobility comments: Patient found sitting at the edge of the bed. She reports she has been gettingin and out of bed without dffficulty   Transfers Overall transfer level: Needs assistance Equipment used: None Transfers: Sit to/from Stand Sit to Stand: Modified independent (Device/Increase time)         General transfer comment: initially (S) for functional mobility, progressed to mod I in room  Ambulation/Gait Ambulation/Gait assistance: Independent Gait Distance (Feet): 75 Feet Assistive device: None Gait Pattern/deviations: Step-through pattern Gait velocity: normal   General Gait Details: no gait deficits. Able to walk while turning head, looking up and down, able to change speeds   Stairs            Wheelchair Mobility    Modified Rankin (Stroke Patients Only) Modified Rankin  (Stroke Patients Only) Pre-Morbid Rankin Score: No symptoms Modified Rankin: No significant disability     Balance Overall balance assessment: Needs assistance         Standing balance support: No upper extremity supported Standing balance-Leahy Scale: Normal                 High Level Balance Comments: tandem stance no assist; tandem stance no problems              Pertinent Vitals/Pain Pain Assessment: No/denies pain    Home Living Family/patient expects to be discharged to:: Private residence Living Arrangements: Spouse/significant other Available Help at Discharge: Family Type of Home: House       Home Layout: One level        Prior Function Level of Independence: Independent         Comments: Professor for [employer]   Dominant Hand: Right    Extremity/Trunk Assessment   Upper Extremity Assessment Upper Extremity Assessment: Defer to OT evaluation RUE Deficits / Details: 4/5 R UE RUE Coordination: decreased fine motor    Lower Extremity Assessment Lower Extremity Assessment: Overall WFL for tasks assessed    Cervical / Trunk Assessment Cervical / Trunk Assessment: Normal  Communication   Communication: No difficulties  Cognition Arousal/Alertness: Awake/alert Behavior During Therapy: WFL for tasks assessed/performed Overall Cognitive Status: Within Functional Limits for tasks assessed  General Comments      Exercises     Assessment/Plan    PT Assessment Patent does not need any further PT services  PT Problem List         PT Treatment Interventions      PT Goals (Current goals can be found in the Care Plan section)  Acute Rehab PT Goals 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" PT Goal Formulation: With patient Time For Goal Achievement: 01/15/19 Potential to Achieve Goals: Good    Frequency     Barriers to discharge        Co-evaluation                AM-PAC PT "6 Clicks" Mobility  Outcome Measure Help needed turning from your back to your side while in a flat bed without using bedrails?: None Help needed moving from lying on your back to sitting on the side of a flat bed without using bedrails?: None Help needed moving to and from a bed to a chair (including a wheelchair)?: None Help needed standing up from a chair using your arms (e.g., wheelchair or bedside chair)?: None Help needed to walk in hospital room?: None Help needed climbing 3-5 steps with a railing? : None 6 Click Score: 24    End of Session Equipment Utilized During Treatment: Gait belt Activity Tolerance: Patient tolerated treatment well Patient left: in bed;with call bell/phone within reach(sitting edge of the bed ) Nurse Communication: Mobility status PT Visit Diagnosis: Other abnormalities of gait and mobility (R26.89)    Time: 1121-1140 PT Time Calculation (min) (ACUTE ONLY): 19 min   Charges:   PT Evaluation $PT Eval Low Complexity: 1 Low          Dessie Comaavid J Estanislao Harmon PT DPT  01/08/2019, 2:21 PM

## 2019-01-08 NOTE — Progress Notes (Signed)
SLP Cancellation Note  Patient Details Name: VEDA ARRELLANO MRN: [id number] DOB: 1951-07-31   Cancelled treatment:       Reason Eval/Treat Not Completed: Patient at procedure or test/unavailable(Pt currently off unit for CTA. SLP will follow up. )  Connelly Spruell I. Hardin Negus, [place], [place] Office number [phone] Pager [place] 01/08/2019, 9:16 AM

## 2019-01-08 NOTE — Discharge Summary (Signed)
Physician Discharge Summary  Catherine Bautista WJX:[id number] DOB: October 28, 1950 DOA: 01/07/2019  PCP: Renford DillsPolite, Ronald, MD  Admit date: 01/07/2019 Discharge date: 01/08/2019  Admitted From: home Discharge disposition: home   Recommendations for Outpatient Follow-Up:   1. Titration of insulin for better control== patient also to make dietary changes-- increasing her protein intake and eating more frequent smaller meals 2. ? Sleep apnea evaluation 3. BMP 1 week re: Cr   Discharge Diagnosis:   Principal Problem:   Stroke [facility](HCC) Active Problems:   Dyslipidemia   Anemia   Essential hypertension   Type 2 diabetes mellitus with hyperglycemia, with long-term current use of insulin (HCC)   Hyperthyroidism   AKI (acute kidney injury) (HCC)    Discharge Condition: Improved.  Diet recommendation: Low sodium, heart healthy.  Carbohydrate-modified.   Wound care: None.  Code status: Full.   History of Present Illness:  Catherine ChimesLou S Dell is a 68 y.o. female with medical history significant of stroke x3, hypertension, hyperlipidemia, diabetes mellitus, depression with anxiety, Graves' disease, hypothyroidism, loop recorder placement, who presents with right hand weakness.  Pt stats that she started having right hand weakness since last night. She has difficulty with gripping and dropping things at the grocery store initially. She does not have sensory loss in extremities.  She states that she had bilateral leg numbness in AM which has resolved. No slurred speech, vision change or hearing loss. She denies chest pain, shortness of breath, cough, fever or chills. Patient states that she has chronic mild diarrhea, which has not changed.  No nausea vomiting or abdominal pain.  No symptoms of UTI.  She states that she had history of 3 strokes in the past, the first with speech change and right sided weakness, the second with left sided weakness and the third with RUE weakness. She states that her weakness from  previous stroke had largely recovered. Her right hand weakness is new today.   Hospital Course by Problem:   cva - presented with hand weakness secondary to embolic right frontal infarct of cryptogenic etiology.   -has had extensive prior work-up stroke etiology which has been an EEG reading.  - has a loop recorder which recently has not shown A. fib.   -aspirin/ Plavix for 3 weeks followed by aspirin alone.  Per neurology Consider possible participation in the [place] for stroke prevention.  Dyslipidemia: -lipitor -LDL <70   Essential hypertension: -Allow permissive hypertension with eventual return to normal  Type 2 diabetes mellitus with hyperglycemia, with long-term current use of insulin (HCC): Last A1c 9.9 on 01/03/16, poorly controled. Patient is taking Metformin, Januvia, Levemir at home -slowly increase levemir up until blood sugars controlled -change diet for more consistent meals with protein  Hyperthyroidism: -Continue methimazole     Medical Consultants:   neurology   Discharge Exam:   Vitals:   01/08/19 0729 01/08/19 1150  BP: 140/74 (!) 159/74  Pulse: (!) 57 64  Resp: 20 16  Temp: 98 F (36.7 C) 98.4 F (36.9 C)  SpO2: 100% 100%   Vitals:   01/08/19 0001 01/08/19 0407 01/08/19 0729 01/08/19 1150  BP: (!) 163/75 135/72 140/74 (!) 159/74  Pulse: 65 66 (!) 57 64  Resp: 14 14 20 16   Temp: 98.4 F (36.9 C) 97.8 F (36.6 C) 98 F (36.7 C) 98.4 F (36.9 C)  TempSrc: Oral Oral Oral Oral  SpO2: 100% 99% 100% 100%  Weight: 129.8 kg     Height: 5'  6" (1.676 m)       General exam: Appears calm and comfortable  The results of significant diagnostics from this hospitalization (including imaging, microbiology, ancillary and laboratory) are listed below for reference.     Procedures and Diagnostic Studies:   Ct Angio Head W Or Wo Contrast  Result Date: 01/08/2019 CLINICAL DATA:  Multiple acute LEFT frontal strokes. Stroke risk factors  include hypertension, old stroke and diabetes. RIGHT upper extremity weakness. EXAM: CT ANGIOGRAPHY HEAD AND NECK TECHNIQUE: Multidetector CT imaging of the head and neck was performed using the standard protocol during bolus administration of intravenous contrast. Multiplanar CT image reconstructions and MIPs were obtained to evaluate the vascular anatomy. Carotid stenosis measurements (when applicable) are obtained utilizing NASCET criteria, using the distal internal carotid diameter as the denominator. CONTRAST:  75mL OMNIPAQUE IOHEXOL 350 MG/ML SOLN COMPARISON:  MR head 01/07/2019. MRA reported separately. CT head without contrast 01/07/2019 FINDINGS: CTA NECK FINDINGS Aortic arch: Standard branching. Imaged portion shows no evidence of aneurysm or dissection. No significant stenosis of the major arch vessel origins. Aortic atherosclerosis. Right carotid system: No evidence of dissection, stenosis (50% or greater) or occlusion. Minor plaque at the bifurcation. Dolichoectasia. Left carotid system: No evidence of dissection, stenosis (50% or greater) or occlusion. No significant plaque at the bifurcation. Dolichoectasia. Vertebral arteries: BILATERAL vertebral arteries are patent, RIGHT dominant. Skeleton: No significant findings. Other neck: Unremarkable. Upper chest: No mass or pneumothorax. Loop recorder anterior chest. Review of the MIP images confirms the above findings CTA HEAD FINDINGS Anterior circulation: Extensive calcification of the cavernous carotids bilaterally, slightly greater on the LEFT, estimated 50-75% BILATERAL stenoses. ICA termini widely patent, LEFT slightly larger. Dominant LEFT anterior cerebral artery. Non dominant RIGHT A1 ACA mildly irregular. No flow-limiting stenosis of the RIGHT or LEFT M1 MCA. No MCA branch occlusion. Posterior circulation: Basilar artery widely patent, with both vertebrals contributing. Flow-limiting stenosis of the LEFT P1 PCA, 75%. Mild irregularity distal P1  proximal P2 segment. No cerebellar branch occlusion. Venous sinuses: As permitted by contrast timing, patent. Anatomic variants: None of significance. Delayed phase: Not performed. Review of the MIP images confirms the above findings IMPRESSION: 1. No flow-limiting  extracranial stenosis of significance. 2. Calcific atheromatous change in the cavernous carotids, estimated 50-75% stenoses bilaterally. LEFT PCA stenosis potentially flow reducing. 3. No emergent large vessel occlusion. 4.  Aortic Atherosclerosis (ICD10-I70.0). Electronically Signed   By: Elsie StainJohn T Curnes M.D.   On: 01/08/2019 09:43   Ct Head Wo Contrast  Result Date: 01/07/2019 CLINICAL DATA:  Right hand weakness since last night. Remote history infarctions. EXAM: CT HEAD WITHOUT CONTRAST TECHNIQUE: Contiguous axial images were obtained from the base of the skull through the vertex without intravenous contrast. COMPARISON:  Brain MRI and CT head 01/02/2016 FINDINGS: Brain: Stable encephalomalacia changes involving the left temporoparietal region related to a prior MCA territory infarct. No CT findings to suggest a new hemispheric infarction or intracranial hemorrhage. No extra-axial fluid collections are identified. The gray-white differentiation is maintained. The ventricles are in the midline without mass effect or shift and are stable in configuration when compared to prior study. The brainstem and cerebellum appear unremarkable and stable. Vascular: Stable vascular calcifications without definite aneurysm or hyperdense vessels. Skull: No skull fracture or bone lesions. Sinuses/Orbits: The paranasal sinuses and mastoid air cells are clear. The globes are intact. Other: No scalp lesions or scalp hematoma. IMPRESSION: 1. Remote left temporoparietal infarct. 2. No acute intracranial findings or mass lesions. Electronically Signed  By: Rudie Meyer M.D.   On: 01/07/2019 14:01   Ct Angio Neck W Or Wo Contrast  Result Date: 01/08/2019 CLINICAL DATA:   Multiple acute LEFT frontal strokes. Stroke risk factors include hypertension, old stroke and diabetes. RIGHT upper extremity weakness. EXAM: CT ANGIOGRAPHY HEAD AND NECK TECHNIQUE: Multidetector CT imaging of the head and neck was performed using the standard protocol during bolus administration of intravenous contrast. Multiplanar CT image reconstructions and MIPs were obtained to evaluate the vascular anatomy. Carotid stenosis measurements (when applicable) are obtained utilizing NASCET criteria, using the distal internal carotid diameter as the denominator. CONTRAST:  75mL OMNIPAQUE IOHEXOL 350 MG/ML SOLN COMPARISON:  MR head 01/07/2019. MRA reported separately. CT head without contrast 01/07/2019 FINDINGS: CTA NECK FINDINGS Aortic arch: Standard branching. Imaged portion shows no evidence of aneurysm or dissection. No significant stenosis of the major arch vessel origins. Aortic atherosclerosis. Right carotid system: No evidence of dissection, stenosis (50% or greater) or occlusion. Minor plaque at the bifurcation. Dolichoectasia. Left carotid system: No evidence of dissection, stenosis (50% or greater) or occlusion. No significant plaque at the bifurcation. Dolichoectasia. Vertebral arteries: BILATERAL vertebral arteries are patent, RIGHT dominant. Skeleton: No significant findings. Other neck: Unremarkable. Upper chest: No mass or pneumothorax. Loop recorder anterior chest. Review of the MIP images confirms the above findings CTA HEAD FINDINGS Anterior circulation: Extensive calcification of the cavernous carotids bilaterally, slightly greater on the LEFT, estimated 50-75% BILATERAL stenoses. ICA termini widely patent, LEFT slightly larger. Dominant LEFT anterior cerebral artery. Non dominant RIGHT A1 ACA mildly irregular. No flow-limiting stenosis of the RIGHT or LEFT M1 MCA. No MCA branch occlusion. Posterior circulation: Basilar artery widely patent, with both vertebrals contributing. Flow-limiting  stenosis of the LEFT P1 PCA, 75%. Mild irregularity distal P1 proximal P2 segment. No cerebellar branch occlusion. Venous sinuses: As permitted by contrast timing, patent. Anatomic variants: None of significance. Delayed phase: Not performed. Review of the MIP images confirms the above findings IMPRESSION: 1. No flow-limiting  extracranial stenosis of significance. 2. Calcific atheromatous change in the cavernous carotids, estimated 50-75% stenoses bilaterally. LEFT PCA stenosis potentially flow reducing. 3. No emergent large vessel occlusion. 4.  Aortic Atherosclerosis (ICD10-I70.0). Electronically Signed   By: Elsie Stain M.D.   On: 01/08/2019 09:43   Mr Maxine Glenn Head Wo Contrast  Result Date: 01/08/2019 CLINICAL DATA:  Acute LEFT frontal strokes. RIGHT upper extremity weakness. History of hypertension, diabetes, and prior strokes. EXAM: MRA HEAD WITHOUT CONTRAST TECHNIQUE: Angiographic images of the Circle of Willis were obtained using MRA technique without intravenous contrast. COMPARISON:  CTA head neck reported separately. FINDINGS: The cervical and petrous internal carotid arteries are widely patent. There is artifactual misregistration due to patient motion in the cavernous ICA segments. There is estimated 50-75% cavernous ICA stenosis bilaterally. Supraclinoid ICA segments are patent, possible poststenotic dilatation on the RIGHT. BILATERAL M1 MCA segments widely patent, LEFT slightly larger. Dominant LEFT A1 ACA, with mildly irregular non dominant RIGHT A1. Patent anterior communicating artery. No MCA branch occlusion or saccular aneurysm. Basilar artery widely patent with both vertebrals contributing. 75% stenosis LEFT P1 PCA origin. Mild irregularity distal P1 segment on the LEFT. No cerebellar branch occlusion. IMPRESSION: BILATERAL cavernous ICA stenoses, 50-75%, probably greater on the LEFT, possible post stenotic dilatation of the LEFT ICA terminus. Potentially flow-limiting stenosis LEFT P1 PCA. No  significant M1 MCA stenosis or MCA branch occlusion Electronically Signed   By: Elsie Stain M.D.   On: 01/08/2019 09:50   Mr Brain  Wo Contrast  Result Date: 01/07/2019 CLINICAL DATA:  Right hand weakness beginning last night. EXAM: MRI HEAD WITHOUT CONTRAST TECHNIQUE: Multiplanar, multiecho pulse sequences of the brain and surrounding structures were obtained without intravenous contrast. COMPARISON:  01/02/2016 FINDINGS: Brain: Diffusion imaging shows a few scattered punctate infarctions in the left posterior frontal region, probably affecting the precentral gyrus region and responsible for the clinical presentation. No large confluent infarction. No mass lesion, hemorrhage, hydrocephalus or extra-axial collection. Elsewhere, brainstem and cerebellum are normal. There is old lacunar infarction in the left thalamus. There is old left middle cerebral artery territory branch vessel infarction affecting the superior temporal lobe, deep insula and temporoparietal junction region. Vascular: Major vessels at the base of the brain show flow. Skull and upper cervical spine: Negative Sinuses/Orbits: Clear/normal Other: None IMPRESSION: Several scattered punctate acute infarctions in the left posterior frontal region, possibly affecting the precentral gyrus. No swelling or hemorrhage. No large confluent acute infarction. Old infarction affecting the left upper temporal lobe, deep insula and temporoparietal junction consistent with old left MCA branch vessel territory infarction. Electronically Signed   By: Paulina FusiMark  Shogry M.D.   On: 01/07/2019 20:59   Mr Cervical Spine Wo Contrast  Result Date: 01/07/2019 CLINICAL DATA:  Acute presentation with right hand weakness beginning last night. EXAM: MRI CERVICAL SPINE WITHOUT CONTRAST TECHNIQUE: Multiplanar, multisequence MR imaging of the cervical spine was performed. No intravenous contrast was administered. COMPARISON:  None. FINDINGS: Alignment: Normal Vertebrae: Normal  Cord: Normal Posterior Fossa, vertebral arteries, paraspinal tissues: Normal Disc levels: No significant degenerative change, stenosis or neural compression in the cervical region. There are ordinary mild non-compressive disc bulges from C2-3 through C7-T1, but no compressive narrowing of the canal or foramina. No facet arthropathy. IMPRESSION: Ordinary mild non-compressive cervical degenerative changes as above. No canal or foraminal stenosis explain the patient's right hand weakness. Electronically Signed   By: Paulina FusiMark  Shogry M.D.   On: 01/07/2019 21:01     Labs:   Basic Metabolic Panel: Recent Labs  Lab 01/07/19 1341  NA 138  K 4.0  CL 108  CO2 21*  GLUCOSE 176*  BUN 16  CREATININE 1.32*  CALCIUM 8.7*   GFR Estimated Creatinine Clearance: 57.9 mL/min (A) (by C-G formula based on SCr of 1.32 mg/dL (H)). Liver Function Tests: Recent Labs  Lab 01/07/19 1341  AST 31  ALT 31  ALKPHOS 105  BILITOT 0.6  PROT 6.9  ALBUMIN 3.5   No results for input(s): LIPASE, AMYLASE in the last 168 hours. No results for input(s): AMMONIA in the last 168 hours. Coagulation profile No results for input(s): INR, PROTIME in the last 168 hours.  CBC: Recent Labs  Lab 01/07/19 1341  WBC 7.1  NEUTROABS 4.9  HGB 11.7*  HCT 37.1  MCV 90.0  PLT 296   Cardiac Enzymes: No results for input(s): CKTOTAL, CKMB, CKMBINDEX, TROPONINI in the last 168 hours. BNP: Invalid input(s): POCBNP CBG: Recent Labs  Lab 01/08/19 0648 01/08/19 1146  GLUCAP 162* 153*   D-Dimer No results for input(s): DDIMER in the last 72 hours. Hgb A1c Recent Labs    01/08/19 0405  HGBA1C 8.8*   Lipid Profile Recent Labs    01/08/19 0405  CHOL 107  HDL 41  LDLCALC 56  TRIG 49  CHOLHDL 2.6   Thyroid function studies No results for input(s): TSH, T4TOTAL, T3FREE, THYROIDAB in the last 72 hours.  Invalid input(s): FREET3 Anemia work up No results for input(s): VITAMINB12, FOLATE, FERRITIN, TIBC, IRON,  RETICCTPCT in the last 72 hours. Microbiology Recent Results (from the past 240 hour(s))  SARS Coronavirus 2 (CEPHEID - Performed in [facility] hospital lab), Hosp Order     Status: None   Collection Time: 01/07/19 10:14 PM  Result Value Ref Range Status   SARS Coronavirus 2 NEGATIVE NEGATIVE Final    Comment: (NOTE) If result is NEGATIVE SARS-CoV-2 target nucleic acids are NOT DETECTED. The SARS-CoV-2 RNA is generally detectable in upper and lower  respiratory specimens during the acute phase of infection. The lowest  concentration of SARS-CoV-2 viral copies this assay can detect is 250  copies / mL. A negative result does not preclude SARS-CoV-2 infection  and should not be used as the sole basis for treatment or other  patient management decisions.  A negative result may occur with  improper specimen collection / handling, submission of specimen other  than nasopharyngeal swab, presence of viral mutation(s) within the  areas targeted by this assay, and inadequate number of viral copies  (<250 copies / mL). A negative result must be combined with clinical  observations, patient history, and epidemiological information. If result is POSITIVE SARS-CoV-2 target nucleic acids are DETECTED. The SARS-CoV-2 RNA is generally detectable in upper and lower  respiratory specimens dur ing the acute phase of infection.  Positive  results are indicative of active infection with SARS-CoV-2.  Clinical  correlation with patient history and other diagnostic information is  necessary to determine patient infection status.  Positive results do  not rule out bacterial infection or co-infection with other viruses. If result is PRESUMPTIVE POSTIVE SARS-CoV-2 nucleic acids MAY BE PRESENT.   A presumptive positive result was obtained on the submitted specimen  and confirmed on repeat testing.  While 2019 novel coronavirus  (SARS-CoV-2) nucleic acids may be present in the submitted sample  additional  confirmatory testing may be necessary for epidemiological  and / or clinical management purposes  to differentiate between  SARS-CoV-2 and other Sarbecovirus currently known to infect humans.  If clinically indicated additional testing with an alternate test  methodology [phone]) is advised. The SARS-CoV-2 RNA is generally  detectable in upper and lower respiratory sp ecimens during the acute  phase of infection. The expected result is Negative. Fact Sheet for Patients:  StrictlyIdeas.no Fact Sheet for Healthcare Providers: BankingDealers.co.za This test is not yet approved or cleared by the [place] FDA and has been authorized for detection and/or diagnosis of SARS-CoV-2 by FDA under an Emergency Use Authorization (EUA).  This EUA will remain in effect (meaning this test can be used) for the duration of the COVID-19 declaration under Section 564(b)(1) of the Act, 21 U.S.C. section 360bbb-3(b)(1), unless the authorization is terminated or revoked sooner. Performed at [facility], [place] [address], [place], [place] [address]      Discharge Instructions:   Discharge Instructions    Diet - low sodium heart healthy   Complete by:  As directed    Diet Carb Modified   Complete by:  As directed    Discharge instructions   Complete by:  As directed    ASA/plavix together for 3 weeks and then ASA alone Increase your insulin by 5 this week  Get more protein in your diet Limit starchy vegetables   Increase activity slowly   Complete by:  As directed      Allergies as of 01/08/2019      Reactions   Canagliflozin Other (See Comments)   Decreased renal function, dizziness, and  extremely lethargy   Demerol Nausea And Vomiting   Excedrin Extra Strength [aspirin-acetaminophen-caffeine] Other (See Comments)   Weird feeling   Janumet [sitagliptin-metformin Hcl] Other (See Comments)   Bloating and gas   Meperidine Nausea And  Vomiting   Morphine And Related Other (See Comments)   "Weird feelings"   Novocain [procaine Hcl] Nausea And Vomiting   Per pt "I never had a problem with lidocaine, just novocain."   Procaine Other (See Comments)   Reaction not noted      Medication List    TAKE these medications   aspirin EC 81 MG tablet Take 1 tablet (81 mg total) by mouth daily.   atorvastatin 40 MG tablet Commonly known as:  LIPITOR Take 1 tablet (40 mg total) by mouth daily at 6 PM. What changed:  when to take this   b complex vitamins tablet Take 1 tablet by mouth daily. Reported on 01/13/2016   clopidogrel 75 MG tablet Commonly known as:  PLAVIX TAKE 1 TABLET BY MOUTH EVERY DAY   DULoxetine 30 MG capsule Commonly known as:  CYMBALTA Take 30 mg by mouth daily.   ferrous sulfate 325 (65 FE) MG tablet Take 325 mg by mouth daily with breakfast.   glimepiride 4 MG tablet Commonly known as:  AMARYL Take 4 mg by mouth daily with breakfast.   Hair/Skin/Nails Caps Take 1 capsule by mouth daily.   hydrochlorothiazide 25 MG tablet Commonly known as:  HYDRODIURIL Take 1 tablet (25 mg total) by mouth daily. Start taking on:  January 11, 2019 What changed:  These instructions start on January 11, 2019. If you are unsure what to do until then, ask your doctor or other care provider.   Januvia 100 MG tablet Generic drug:  sitaGLIPtin Take 100 mg by mouth daily.   Levemir FlexTouch 100 UNIT/ML Pen Generic drug:  Insulin Detemir Inject 48 Units into the skin at bedtime.   Magnesium 300 MG Caps Take 300 mg by mouth daily.   metFORMIN 750 MG 24 hr tablet Commonly known as:  GLUCOPHAGE-XR Take 750 mg by mouth 2 (two) times a day.   methimazole 10 MG tablet Commonly known as:  TAPAZOLE Take 10 mg by mouth daily.   Omega 3 1200 MG Caps Take 1,200 mg by mouth at bedtime.   ramipril 10 MG capsule Commonly known as:  ALTACE Take 10 mg by mouth daily.   vitamin B-12 1000 MCG tablet Commonly known as:   CYANOCOBALAMIN Take 1,000 mcg by mouth daily.   vitamin C 500 MG tablet Commonly known as:  ASCORBIC ACID Take 500 mg by mouth daily.   Vitamin D3 50 MCG (2000 UT) Tabs Take 2,000 Units by mouth daily.      Follow-up Information    Renford Dills, MD Follow up in 1 week(s).   Specialty:  Internal Medicine Contact information: [address] [place] [address] [phone]        Micki Riley, MD Follow up.   Specialties:  Neurology, Radiology Contact information: [address] [place] [address] [phone]            Time coordinating discharge:30 min  Signed:  Joseph Art DO  [facility] 01/08/2019, 2:45 PM

## 2019-01-08 NOTE — Progress Notes (Signed)
IV and tele discontinued without complication. Pt education provided and all questions answered. Pt discharged from facility via wheelchair

## 2019-01-08 NOTE — Progress Notes (Signed)
  Echocardiogram 2D Echocardiogram has been performed.  Jennette Dubin 01/08/2019, 10:57 AM

## 2019-01-08 NOTE — Evaluation (Signed)
Occupational Therapy Evaluation Patient Details Name: Catherine Bautista MRN: [id number] DOB: Nov 02, 1950 Today's Date: 01/08/2019    History of Present Illness Catherine Bautista is a 68 y.o. female with medical history significant of stroke x3, hypertension, hyperlipidemia, diabetes mellitus, depression with anxiety, Graves' disease, hypothyroidism, loop recorder placement, who presents with right hand weakness. MRI shower 3 punctate infarcts in the L MCA territory    Clinical Impression   Catherine Bautista presents to OT with [facility] and strength deficits in her R UE following L MCA infarct. Theraputty and HEP provided, as pt is very motivated to return to PLOF. Pt would benefit from outpatient OT to increase functional use of her R UE. OT will continue to follow acutely.     Follow Up Recommendations  Outpatient OT    Equipment Recommendations  None recommended by OT    Recommendations for Other Services PT consult     Precautions / Restrictions Precautions Precautions: Fall      Mobility Bed Mobility Overal bed mobility: Modified Independent                Transfers Overall transfer level: Needs assistance Equipment used: None Transfers: Sit to/from Stand Sit to Stand: Modified independent (Device/Increase time)         General transfer comment: initially (S) for functional mobility, progressed to mod I in room    Balance Overall balance assessment: Mild deficits observed, not formally tested                                         ADL either performed or assessed with clinical judgement   ADL Overall ADL's : Needs assistance/impaired Eating/Feeding: Supervision/ safety;Sitting;Cueing for compensatory techinques   Grooming: Supervision/safety;Cueing for compensatory techniques   Upper Body Bathing: Modified independent   Lower Body Bathing: Modified independent   Upper Body Dressing : Modified independent   Lower Body Dressing: Modified independent Lower Body  Dressing Details (indicate cue type and reason): Discussed safety awareness and completing LB dressing from seated Toilet Transfer: Supervision/safety   Toileting- Clothing Manipulation and Hygiene: Supervision/safety       Functional mobility during ADLs: Supervision/safety General ADL Comments: Pt progressed to mod I for functional mobility in the room. Extensive discussion re compensatory vs adaptive approach to increase R UE FMC     Vision Baseline Vision/History: No visual deficits Patient Visual Report: No change from baseline Vision Assessment?: No apparent visual deficits            Pertinent Vitals/Pain Pain Assessment: No/denies pain     Hand Dominance Right   Extremity/Trunk Assessment Upper Extremity Assessment Upper Extremity Assessment: RUE deficits/detail RUE Deficits / Details: 4/5 R UE RUE Coordination: decreased fine motor   Lower Extremity Assessment Lower Extremity Assessment: Defer to PT evaluation   Cervical / Trunk Assessment Cervical / Trunk Assessment: Normal   Communication Communication Communication: No difficulties   Cognition Arousal/Alertness: Awake/alert Behavior During Therapy: WFL for tasks assessed/performed Overall Cognitive Status: Within Functional Limits for tasks assessed                                                Home Living Family/patient expects to be discharged to:: Private residence Living Arrangements: Spouse/significant other Available Help at Discharge: Family  Type of Home: House       Home Layout: One level     Bathroom Shower/Tub: Engineer, productionTub/shower unit     Bathroom Accessibility: Yes          Lives With: Spouse    Prior Functioning/Environment Level of Independence: Independent        Comments: Professor for [employer]        OT Problem List: Decreased strength;Decreased range of motion;Decreased activity tolerance;Impaired balance (sitting and/or standing);Decreased  coordination;Impaired sensation;Impaired tone      OT Treatment/Interventions: Self-care/ADL training;Balance training;Patient/family education;Therapeutic activities;Therapeutic exercise;Neuromuscular education    OT Goals(Current goals can be found in the care plan section) Acute Rehab OT Goals 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" OT Goal Formulation: With patient Time For Goal Achievement: 01/14/19 Potential to Achieve Goals: Good  OT Frequency: Min 3X/week    AM-PAC OT "6 Clicks" Daily Activity     Outcome Measure Help from another person eating meals?: A Little Help from another person taking care of personal grooming?: A Little Help from another person toileting, which includes using toliet, bedpan, or urinal?: None Help from another person bathing (including washing, rinsing, drying)?: None Help from another person to put on and taking off regular upper body clothing?: None Help from another person to put on and taking off regular lower body clothing?: None 6 Click Score: 22   End of Session    Activity Tolerance: Patient tolerated treatment well Patient left: in bed;with call bell/phone within reach  OT Visit Diagnosis: Unsteadiness on feet (R26.81);Muscle weakness (generalized) (M62.81)                Time: 4098-11911049-1120 OT Time Calculation (min): 31 min Charges:  OT General Charges $OT Visit: 1 Visit OT Evaluation $OT Eval Low Complexity: 1 Low OT Treatments $Self Care/Home Management : 8-22 mins  Crissie ReeseSandra H Pascal Stiggers OTR/L  01/08/2019, 12:01 PM

## 2019-01-08 NOTE — Evaluation (Signed)
Speech Language Pathology Evaluation Patient Details Name: Catherine Bautista MRN: [id number] DOB: Jul 01, 1951 Today's Date: 01/08/2019 Time: 4854-6270 SLP Time Calculation (min) (ACUTE ONLY): 25 min  Problem List:  Patient Active Problem List   Diagnosis Date Noted  . AKI (acute kidney injury) ([place]) 01/07/2019  . Hyperthyroidism 06/08/2017  . History of stroke 03/23/2016  . Type 2 diabetes mellitus with hyperglycemia, with long-term current use of insulin ([place])   . Essential hypertension   . Cerebrovascular accident (CVA) due to embolism of left middle cerebral artery ([place])   . Acute CVA (cerebrovascular accident) ([place]) 01/03/2016  . Dyslipidemia   . Hypertension   . Stroke ([place])   . Anemia   . Depression with anxiety   . Right arm weakness   . Sleep apnea 01/31/2012  . Anemia, unspecified 05/21/2011   Past Medical History:  Past Medical History:  Diagnosis Date  . Anemia   . Anemia, vitamin B12 deficiency   . Depression with anxiety   . Diabetes mellitus   . Dyslipidemia   . Fibromyalgia   . Graves disease   . Hypertension   . Obesity   . Stroke ([place])    "I've had 3 strokes"   Past Surgical History:  Past Surgical History:  Procedure Laterality Date  . ABDOMINAL HYSTERECTOMY  2007  . CHOLECYSTECTOMY     laproscopic  . EP IMPLANTABLE DEVICE N/A 01/05/2016   Procedure: Loop Recorder Insertion;  Surgeon: Will Meredith Leeds, MD;  Location: [place] [facility];  Service: Cardiovascular;  Laterality: N/A;  . GASTRIC BYPASS  1981  . TEE WITHOUT CARDIOVERSION N/A 01/05/2016   Procedure: TRANSESOPHAGEAL ECHOCARDIOGRAM (TEE);  Surgeon: Sueanne Margarita, MD;  Location: [facility] ENDOSCOPY;  Service: Cardiovascular;  Laterality: N/A;  . TEE WITHOUT CARDIOVERSION N/A 04/18/2017   Procedure: TRANSESOPHAGEAL ECHOCARDIOGRAM (TEE);  Surgeon: Lelon Perla, MD;  Location: [facility] ENDOSCOPY;  Service: Cardiovascular;  Laterality: N/A;  . therapuetic abortion  1986   associated with C-section   HPI:   Pt is a 68 y.o. female with medical history significant of stroke x3, hypertension, hyperlipidemia, diabetes mellitus, depression with anxiety, Graves' disease, hypothyroidism, loop recorder placement, who presented with right hand weakness. MRI of the brain revealed several scattered punctate acute infarctions in the left posterior frontal region, possibly affecting the precentral gyrus. Old infarction affecting the left upper temporal lobe, deep insula and temporoparietal junction consistent with old left MCA branch vessel territory infarction.   Assessment / Plan / Recommendation Clinical Impression  Pt reported that she was living independently prior to admission with her husband who has some memory deficits. She stated that she has a PhD in Cultural Studies and is employed full-time as a Psychiatrist at [employer]. She denied any baseline or new deficits in speech or cognition but stated that she does have mild aphasia from a previous CVA in 2002. The [facility] Cognitive Assessment 8.1 was completed to evaluate the pt's cognitive-linguistic skills. She achieved a score of 28/30 which is within the normal limits of 26 or more out of 30 and no cognitive-linguistic deficits were noted during informal assessment. No motor speech impairments were demonstrated and her language skills were within functional limits with use of compensatory strategies for occasional word retrieval difficulty. Further skilled SLP services are not clinically indicated at this time. Pt and nursing were educated regarding results and recommendations; both parties verbalized understanding as well as agreement with plan of care.    SLP Assessment  SLP  Recommendation/Assessment: Patient does not need any further Speech Lanaguage Pathology Services SLP Visit Diagnosis: Aphasia (R47.01)    Follow Up Recommendations  None    Frequency and Duration           SLP Evaluation Cognition  Overall  Cognitive Status: Within Functional Limits for tasks assessed Arousal/Alertness: Awake/alert Orientation Level: Oriented X4 Attention: Focused;Sustained Focused Attention: Appears intact(Vigilance WNL: 1/1) Sustained Attention: Appears intact(Serial 7s: 3/3) Memory: Appears intact(Immediate: 5/5; Delayed: 5/5) Awareness: Appears intact Problem Solving: Appears intact(5/5) Executive Function: Reasoning;Sequencing;Organizing Reasoning: Appears intact(3/3) Sequencing: Appears intact Organizing: Appears intact(Backward digit span: 1/1)       Comprehension  Auditory Comprehension Overall Auditory Comprehension: Appears within functional limits for tasks assessed Yes/No Questions: Within Functional Limits Commands: Within Functional Limits Complex Commands: (Trail completion: 1/1) Conversation: AdministratorComplex Visual Recognition/Discrimination Discrimination: Within Function Limits Reading Comprehension Reading Status: Within funtional limits    Expression Expression Primary Mode of Expression: Verbal Verbal Expression Overall Verbal Expression: Appears within functional limits for tasks assessed Initiation: No impairment Level of Generative/Spontaneous Verbalization: Conversation Repetition: No impairment Naming: No impairment Confrontation: Within functional limits(3/3) Divergent: (1/1) Pragmatics: No impairment Written Expression Dominant Hand: Right Written Expression: (Difficulty copying cube: 0/1)   Oral / Motor  Oral Motor/Sensory Function Overall Oral Motor/Sensory Function: Within functional limits Motor Speech Overall Motor Speech: Appears within functional limits for tasks assessed Respiration: Within functional limits Phonation: Normal Resonance: Within functional limits Articulation: Within functional limitis Intelligibility: Intelligible Motor Planning: Witnin functional limits Motor Speech Errors: Not applicable   Catherine Fidalgo I. Vear ClockPhillips, MS, CCC-SLP Acute  Rehabilitation Services Office number [phone] Pager [phone]            Catherine MartenShanika I Afton Lavalle 01/08/2019, 10:33 AM

## 2019-01-08 NOTE — Progress Notes (Signed)
STROKE TEAM PROGRESS NOTE   INTERVAL HISTORY I have personally reviewed history of presenting illness with the patient.  She presented with sudden onset of hand weakness and numbness which appears to be improving but is not back to baseline yet.  She has prior history of cryptogenic strokes in 2011 and 2017 and has had extensive work-up including TEE as well as loop recorder which have  yet to show A. fib  Vitals:   01/08/19 0001 01/08/19 0407 01/08/19 0729 01/08/19 1150  BP: (!) 163/75 135/72 140/74 (!) 159/74  Pulse: 65 66 (!) 57 64  Resp: Temp: 98.4 F (36.9 C) 97.8 F (36.6 C) 98 F (36.7 C) 98.4 F (36.9 C)  TempSrc: Oral Oral Oral Oral  SpO2: 100% 99% 100% 100%  Weight: 129.8 kg     Height:  (1.676 m)       CBC:  Recent Labs  Lab 01/07/19 1341  WBC 7.1  NEUTROABS 4.9  HGB 11.7*  HCT 37.1  MCV 90.0  PLT 296    Basic Metabolic Panel:  Recent Labs  Lab 01/07/19 1341  NA 138  K 4.0  CL 108  CO2 21*  GLUCOSE 176*  BUN 16  CREATININE 1.32*  CALCIUM 8.7*   Lipid Panel:     Component Value Date/Time   CHOL 107 01/08/2019 0405   TRIG 49 01/08/2019 0405   HDL 41 01/08/2019 0405   CHOLHDL 2.6 01/08/2019 0405   VLDL 10 01/08/2019 0405   LDLCALC 56 01/08/2019 0405   HgbA1c:  Lab Results  Component Value Date   HGBA1C 8.8 (H) 01/08/2019   Urine Drug Screen:     Component Value Date/Time   LABOPIA NONE DETECTED 01/02/2016 1946   COCAINSCRNUR NONE DETECTED 01/02/2016 1946   LABBENZ NONE DETECTED 01/02/2016 1946   AMPHETMU NONE DETECTED 01/02/2016 1946   THCU NONE DETECTED 01/02/2016 1946   LABBARB NONE DETECTED 01/02/2016 1946    Alcohol Level     Component Value Date/Time   ETH <5 01/02/2016 1759    IMAGING Ct Angio Head W Or Wo Contrast  Result Date: 01/08/2019 CLINICAL DATA:  Multiple acute LEFT frontal strokes. Stroke risk factors include hypertension, old stroke and diabetes. RIGHT upper extremity weakness. EXAM: CT  ANGIOGRAPHY HEAD AND NECK TECHNIQUE: Multidetector CT imaging of the head and neck was performed using the standard protocol during bolus administration of intravenous contrast. Multiplanar CT image reconstructions and MIPs were obtained to evaluate the vascular anatomy. Carotid stenosis measurements (when applicable) are obtained utilizing NASCET criteria, using the distal internal carotid diameter as the denominator. CONTRAST:  75mL OMNIPAQUE IOHEXOL 350 MG/ML SOLN COMPARISON:  MR head 01/07/2019. MRA reported separately. CT head without contrast 01/07/2019 FINDINGS: CTA NECK FINDINGS Aortic arch: Standard branching. Imaged portion shows no evidence of aneurysm or dissection. No significant stenosis of the major arch vessel origins. Aortic atherosclerosis. Right carotid system: No evidence of dissection, stenosis (50% or greater) or occlusion. Minor plaque at the bifurcation. Dolichoectasia. Left carotid system: No evidence of dissection, stenosis (50% or greater) or occlusion. No significant plaque at the bifurcation. Dolichoectasia. Vertebral arteries: BILATERAL vertebral arteries are patent, RIGHT dominant. Skeleton: No significant findings. Other neck: Unremarkable. Upper chest: No mass or pneumothorax. Loop recorder anterior chest. Review of the MIP images confirms the above findings CTA HEAD FINDINGS Anterior circulation: Extensive calcification of the cavernous carotids bilaterally, slightly greater on the LEFT, estimated 50-75% BILATERAL stenoses. ICA termini widely patent, LEFT slightly  larger. Dominant LEFT anterior cerebral artery. Non dominant RIGHT A1 ACA mildly irregular. No flow-limiting stenosis of the RIGHT or LEFT M1 MCA. No MCA branch occlusion. Posterior circulation: Basilar artery widely patent, with both vertebrals contributing. Flow-limiting stenosis of the LEFT P1 PCA, 75%. Mild irregularity distal P1 proximal P2 segment. No cerebellar branch occlusion. Venous sinuses: As permitted by  contrast timing, patent. Anatomic variants: None of significance. Delayed phase: Not performed. Review of the MIP images confirms the above findings IMPRESSION: 1. No flow-limiting  extracranial stenosis of significance. 2. Calcific atheromatous change in the cavernous carotids, estimated 50-75% stenoses bilaterally. LEFT PCA stenosis potentially flow reducing. 3. No emergent large vessel occlusion. 4.  Aortic Atherosclerosis (ICD10-I70.0). Electronically Signed   By: Elsie StainJohn T Curnes M.D.   On: 01/08/2019 09:43   Ct Head Wo Contrast  Result Date: 01/07/2019 CLINICAL DATA:  Right hand weakness since last night. Remote history infarctions. EXAM: CT HEAD WITHOUT CONTRAST TECHNIQUE: Contiguous axial images were obtained from the base of the skull through the vertex without intravenous contrast. COMPARISON:  Brain MRI and CT head 01/02/2016 FINDINGS: Brain: Stable encephalomalacia changes involving the left temporoparietal region related to a prior MCA territory infarct. No CT findings to suggest a new hemispheric infarction or intracranial hemorrhage. No extra-axial fluid collections are identified. The gray-white differentiation is maintained. The ventricles are in the midline without mass effect or shift and are stable in configuration when compared to prior study. The brainstem and cerebellum appear unremarkable and stable. Vascular: Stable vascular calcifications without definite aneurysm or hyperdense vessels. Skull: No skull fracture or bone lesions. Sinuses/Orbits: The paranasal sinuses and mastoid air cells are clear. The globes are intact. Other: No scalp lesions or scalp hematoma. IMPRESSION: 1. Remote left temporoparietal infarct. 2. No acute intracranial findings or mass lesions. Electronically Signed   By: Rudie MeyerP.  Gallerani M.D.   On: 01/07/2019 14:01   Ct Angio Neck W Or Wo Contrast  Result Date: 01/08/2019 CLINICAL DATA:  Multiple acute LEFT frontal strokes. Stroke risk factors include hypertension, old  stroke and diabetes. RIGHT upper extremity weakness. EXAM: CT ANGIOGRAPHY HEAD AND NECK TECHNIQUE: Multidetector CT imaging of the head and neck was performed using the standard protocol during bolus administration of intravenous contrast. Multiplanar CT image reconstructions and MIPs were obtained to evaluate the vascular anatomy. Carotid stenosis measurements (when applicable) are obtained utilizing NASCET criteria, using the distal internal carotid diameter as the denominator. CONTRAST:  75mL OMNIPAQUE IOHEXOL 350 MG/ML SOLN COMPARISON:  MR head 01/07/2019. MRA reported separately. CT head without contrast 01/07/2019 FINDINGS: CTA NECK FINDINGS Aortic arch: Standard branching. Imaged portion shows no evidence of aneurysm or dissection. No significant stenosis of the major arch vessel origins. Aortic atherosclerosis. Right carotid system: No evidence of dissection, stenosis (50% or greater) or occlusion. Minor plaque at the bifurcation. Dolichoectasia. Left carotid system: No evidence of dissection, stenosis (50% or greater) or occlusion. No significant plaque at the bifurcation. Dolichoectasia. Vertebral arteries: BILATERAL vertebral arteries are patent, RIGHT dominant. Skeleton: No significant findings. Other neck: Unremarkable. Upper chest: No mass or pneumothorax. Loop recorder anterior chest. Review of the MIP images confirms the above findings CTA HEAD FINDINGS Anterior circulation: Extensive calcification of the cavernous carotids bilaterally, slightly greater on the LEFT, estimated 50-75% BILATERAL stenoses. ICA termini widely patent, LEFT slightly larger. Dominant LEFT anterior cerebral artery. Non dominant RIGHT A1 ACA mildly irregular. No flow-limiting stenosis of the RIGHT or LEFT M1 MCA. No MCA branch occlusion. Posterior circulation: Basilar artery widely patent,  with both vertebrals contributing. Flow-limiting stenosis of the LEFT P1 PCA, 75%. Mild irregularity distal P1 proximal P2 segment. No  cerebellar branch occlusion. Venous sinuses: As permitted by contrast timing, patent. Anatomic variants: None of significance. Delayed phase: Not performed. Review of the MIP images confirms the above findings IMPRESSION: 1. No flow-limiting  extracranial stenosis of significance. 2. Calcific atheromatous change in the cavernous carotids, estimated 50-75% stenoses bilaterally. LEFT PCA stenosis potentially flow reducing. 3. No emergent large vessel occlusion. 4.  Aortic Atherosclerosis (ICD10-I70.0). Electronically Signed   By: Elsie StainJohn T Curnes M.D.   On: 01/08/2019 09:43   Mr Maxine GlennMra Head Wo Contrast  Result Date: 01/08/2019 CLINICAL DATA:  Acute LEFT frontal strokes. RIGHT upper extremity weakness. History of hypertension, diabetes, and prior strokes. EXAM: MRA HEAD WITHOUT CONTRAST TECHNIQUE: Angiographic images of the Circle of Willis were obtained using MRA technique without intravenous contrast. COMPARISON:  CTA head neck reported separately. FINDINGS: The cervical and petrous internal carotid arteries are widely patent. There is artifactual misregistration due to patient motion in the cavernous ICA segments. There is estimated 50-75% cavernous ICA stenosis bilaterally. Supraclinoid ICA segments are patent, possible poststenotic dilatation on the RIGHT. BILATERAL M1 MCA segments widely patent, LEFT slightly larger. Dominant LEFT A1 ACA, with mildly irregular non dominant RIGHT A1. Patent anterior communicating artery. No MCA branch occlusion or saccular aneurysm. Basilar artery widely patent with both vertebrals contributing. 75% stenosis LEFT P1 PCA origin. Mild irregularity distal P1 segment on the LEFT. No cerebellar branch occlusion. IMPRESSION: BILATERAL cavernous ICA stenoses, 50-75%, probably greater on the LEFT, possible post stenotic dilatation of the LEFT ICA terminus. Potentially flow-limiting stenosis LEFT P1 PCA. No significant M1 MCA stenosis or MCA branch occlusion Electronically Signed   By: Elsie StainJohn T  Curnes M.D.   On: 01/08/2019 09:50   Mr Brain Wo Contrast  Result Date: 01/07/2019 CLINICAL DATA:  Right hand weakness beginning last night. EXAM: MRI HEAD WITHOUT CONTRAST TECHNIQUE: Multiplanar, multiecho pulse sequences of the brain and surrounding structures were obtained without intravenous contrast. COMPARISON:  01/02/2016 FINDINGS: Brain: Diffusion imaging shows a few scattered punctate infarctions in the left posterior frontal region, probably affecting the precentral gyrus region and responsible for the clinical presentation. No large confluent infarction. No mass lesion, hemorrhage, hydrocephalus or extra-axial collection. Elsewhere, brainstem and cerebellum are normal. There is old lacunar infarction in the left thalamus. There is old left middle cerebral artery territory branch vessel infarction affecting the superior temporal lobe, deep insula and temporoparietal junction region. Vascular: Major vessels at the base of the brain show flow. Skull and upper cervical spine: Negative Sinuses/Orbits: Clear/normal Other: None IMPRESSION: Several scattered punctate acute infarctions in the left posterior frontal region, possibly affecting the precentral gyrus. No swelling or hemorrhage. No large confluent acute infarction. Old infarction affecting the left upper temporal lobe, deep insula and temporoparietal junction consistent with old left MCA branch vessel territory infarction. Electronically Signed   By: Paulina FusiMark  Shogry M.D.   On: 01/07/2019 20:59   Mr Cervical Spine Wo Contrast  Result Date: 01/07/2019 CLINICAL DATA:  Acute presentation with right hand weakness beginning last night. EXAM: MRI CERVICAL SPINE WITHOUT CONTRAST TECHNIQUE: Multiplanar, multisequence MR imaging of the cervical spine was performed. No intravenous contrast was administered. COMPARISON:  None. FINDINGS: Alignment: Normal Vertebrae: Normal Cord: Normal Posterior Fossa, vertebral arteries, paraspinal tissues: Normal Disc levels:  No significant degenerative change, stenosis or neural compression in the cervical region. There are ordinary mild non-compressive disc bulges from C2-3 through C7-T1,  but no compressive narrowing of the canal or foramina. No facet arthropathy. IMPRESSION: Ordinary mild non-compressive cervical degenerative changes as above. No canal or foraminal stenosis explain the patient's right hand weakness. Electronically Signed   By: Nelson Chimes M.D.   On: 01/07/2019 21:01    PHYSICAL EXAM Pleasant obese middle-aged African-American lady not in distress. . Afebrile. Head is nontraumatic. Neck is supple without bruit.    Cardiac exam no murmur or gallop. Lungs are clear to auscultation. Distal pulses are well felt. Neurological Exam ;  Awake  Alert oriented x 3. Normal speech and language.eye movements full without nystagmus.fundi were not visualized. Vision acuity and fields appear normal. Hearing is normal. Palatal movements are normal. Face symmetric. Tongue midline. Normal strength, tone, reflexes and coordination.  Except mild weakness of right grip and intrinsic hand muscles.  Orbits left over right upper extremity.  Normal sensation. Gait deferred.  ASSESSMENT/PLAN Ms. EMBREE BRAWLEY is a 68 y.o. female with history of previous strokes x3, morbid obesity status post gastric bypass, HTN, HLD, Graves' disease, fibromyalgia, diabetes, anemia presenting with recurrent right hand weakness since Saturday.   Stroke:   L posterior frontal infarcts embolic secondary to unknown source in patient with previous cryptogenic infarcts  CT head old L temporoparietal infarct  MRI several scattered punctate infarcts left posterior frontal region.  Old L MCA infarct.   MR CS ordinary noncompressive cervical degenerative changes  MRA  B cavernous ICA 50 to 75% stenosis.  Potentially flow-limiting L P1 stenosis.  CTA head & neck B ICA cavernous 50 to 75% stenosis.  L PCA stenosis potentially flow-limiting.  No LVO.   Aortic atherosclerosis.  2D Echo EF 60 to 65%.  LA mildly dilated.  Loop recorder, last interrogated 01/06/2019, no new A. fib, no history A. fib  LDL 56  HgbA1c 8.8  Lovenox 40 mg sq daily for VTE prophylaxis  clopidogrel 75 mg daily prior to admission, now on clopidogrel 75 mg daily.  Continue DAPT x3 weeks then aspirin alone  Therapy recommendations: Pending  Disposition:  pending   Hypertension  Stable . Permissive hypertension (OK if < 220/120) but gradually normalize in 5-7 days . Long-term BP goal normotensive  Hyperlipidemia  Home meds:  lipitor 40 and omega 3, resumed in hospital  LDL 56, goal < 70  Continue statin at discharge  Diabetes type II Uncontrolled  HgbA1c 8.8, goal < 7.0  Other Stroke Risk Factors  Advanced age  Morbid Obesity, Body mass index is 46.19 kg/m., s/p gastric bypass, recommend weight loss, diet and exercise as appropriate   Hx stroke/TIA  12/2015 - punctate L frontal cortical infarct. TEE w/ abnormal MV thin density and + PFO w/ sign shunt. Loop recorder placed.enrolled in [facility]  12/2009 - right frontal cortical infarct  2002 - left MCA infarct  No residue  Family hx stroke (cousin, mother)  Family hx DVT  Hx 1 miscarriage out of 5 pregnancies  Obstructive sleep apnea   Factor 7 testing normal, factor 8 mildly elevated on repeated testing  Other Active Problems  Anemia Hgb 11.7  Hypothyroidism  AKI  Hospital day # 0  I have personally obtained history,examined this patient, reviewed notes, independently viewed imaging studies, participated in medical decision making and plan of care.ROS completed by me personally and pertinent positives fully documented  I have made any additions or clarifications directly to the above note.  She presented with hand weakness secondary to embolic right frontal infarct of cryptogenic etiology.  She has had extensive prior work-up stroke etiology which has been an EEG reading.   She has a loop recorder which recently has not shown A. fib.  Recommend aspirin Plavix for 3 weeks followed by aspirin alone.  [place] possible [place] for [place] prevention.  I spoken to the patient and she has expressed interest.  Continue aggressive risk factor modification.  Patient counseled to eat a healthy diet and lose weight.  Also consider outpatient evaluation for sleep apnea.  Discussed with Dr. Benjamine MolaVann.  Greater than 50% time during this 35-minute visit was spent on counseling and coordination of care about her recurrent cryptogenic strokes and discussion about evaluation and treatment plan and answering questions.Catherine Bautista.  Catherine Setter, MD Medical Director [facility] Pager: [phone] 01/08/2019 3:03 PM   To contact Stroke Continuity provider, please refer to [email or web address]. After hours, contact General Neurology

## 2019-01-14 ENCOUNTER — Other Ambulatory Visit: Payer: Self-pay | Admitting: Internal Medicine

## 2019-01-14 DIAGNOSIS — E2839 Other primary ovarian failure: Secondary | ICD-10-CM

## 2019-01-16 NOTE — Progress Notes (Signed)
Carelink Summary Report / Loop Recorder 

## 2019-01-20 ENCOUNTER — Other Ambulatory Visit: Payer: Self-pay | Admitting: Neurology

## 2019-01-20 DIAGNOSIS — I639 Cerebral infarction, unspecified: Secondary | ICD-10-CM

## 2019-01-21 ENCOUNTER — Other Ambulatory Visit: Payer: Self-pay

## 2019-01-21 NOTE — Patient Outreach (Signed)
Catherine Bautista [facility]) Care Management  01/21/2019  Catherine Bautista 03/09/1951 [id number]   EMMI- stroke RED ON EMMI ALERT Day # 9 Date: 01/18/2019 Red Alert Reason:  Sad, hopeless, anxious, or empty? Yes    Outreach attempt: spoke with patient. She is able to verify HIPAA.  She reports that she has a lot going on.  She states that her sister is sick and that fact that she had another stroke is way she is feeling down.  She reports that she talked with Dr. Delfina Redwood about it and does not want to take more medication right now.  Discussed with patient things to help with down feelings such as being outside, reading and breathing activities.  She verbalized understanding and denies any further problems.    Plan: RN CM will close case.    Jone Baseman, RN, MSN [facility] Care Management Care Management Coordinator Direct Line [phone] Toll Free: [phone]  Fax: [phone]

## 2019-01-22 ENCOUNTER — Other Ambulatory Visit: Payer: Self-pay | Admitting: Neurology

## 2019-01-22 DIAGNOSIS — I639 Cerebral infarction, unspecified: Secondary | ICD-10-CM

## 2019-02-08 ENCOUNTER — Ambulatory Visit (INDEPENDENT_AMBULATORY_CARE_PROVIDER_SITE_OTHER): Payer: BC Managed Care – PPO | Admitting: *Deleted

## 2019-02-08 DIAGNOSIS — I63412 Cerebral infarction due to embolism of left middle cerebral artery: Secondary | ICD-10-CM | POA: Diagnosis not present

## 2019-02-08 LAB — CUP PACEART REMOTE DEVICE CHECK
Date Time Interrogation Session: 20200710144145
Implantable Pulse Generator Implant Date: 20170606

## 2019-02-11 ENCOUNTER — Other Ambulatory Visit: Payer: Self-pay

## 2019-02-11 ENCOUNTER — Ambulatory Visit (INDEPENDENT_AMBULATORY_CARE_PROVIDER_SITE_OTHER): Payer: BC Managed Care – PPO | Admitting: Neurology

## 2019-02-11 ENCOUNTER — Encounter: Payer: Self-pay | Admitting: Neurology

## 2019-02-11 VITALS — BP 131/68 | HR 62 | Temp 96.6°F | Wt 291.4 lb

## 2019-02-11 DIAGNOSIS — I639 Cerebral infarction, unspecified: Secondary | ICD-10-CM

## 2019-02-11 NOTE — Progress Notes (Signed)
STROKE NEUROLOGY FOLLOW UP NOTE  NAME: Catherine Bautista DOB: [birth year]  REASON FOR VISIT: stroke follow up HISTORY FROM: pt and chart  Today we had the pleasure of seeing Catherine Bautista in follow-up at our Neurology Clinic. Pt was accompanied by no one.   History Summary Catherine Bautista is a 68 y.o. female with history of diabetes mellitus, hypertension, hyperlipidemia, previous strokes, obesity, and fibromyalgia was admitted on 01/02/16 for right upper extremity numbness and weakness. MRI showed small left frontal cortical infarct, MRA mild to moderate narrowing of the right ICA. CUS, TTE and DVT negative. TEE showed redundant chordae tendinea, thin mobile density anterior MV could be small ruptured chordae tendinae but not rule out small vegetation. However, pt had no sign of endocarditis. Loop recorder placed. LDL 96 and A1C 9.9. She was discharged with DAPT as she failed plavix PTA. Her lipitor increased from 20 to 40mg . Symptoms resolved and she was discharged home.    History of stroke  2002, left MCA infarct  12/2009, right frontal cortical infarct  No residue  03/22/16 follow-up - the patient has been doing well. No stroke like symptoms. BP 104/58. Glucose fluctuates at home as her lantus changed to levemir. On DAPT. Interested in RESPECT ESUS trial.   04/04/17 follow-up - During the interval time, patient has been doing well, no stroke like symptoms. Finished RESPECT ESUS trial and now on plavix. BP today 104/66 and glucose at home in better control with Januvia and Levemir.  08/08/17 follow up: Overall doing well. No complaints at todays visit. States A1c currently 7.1 which patient is happy with as it was previously >9. Pt states she has been eating healthy and taking her medications as prescribed. LDL in good limits per patient but unable to state exact number. Continues to take Lipitor without side effects. BP today 138/70 which per patient is high, at home usually SBP 110-120's. Continues to  take Plavix without side effects. Loop recorder w/o AF episodes. Continues to wear CPAP for sleep apnea. No new CVA/TIA symptoms.   02/05/18 UPDATE: Patient returns today for six-month follow-up and overall is doing well.  She continues to take Plavix without side effects of bleeding or bruising.  Continues to take atorvastatin without side effects myalgias.  Recent lab work done by PCP with LDL 72.  Recent A1c 7.6.  Patient states she has been changing her diet and attempting to eat healthier.  Blood pressure satisfactory 129/64.  She does have a follow-up visit with Dr. Allie Dimmersborn to check on OSA with CPAP.  She was told by [facility] that her machine is over 67 years old and may possibly need to look into obtaining a new one.  She did have one episode last week of a dizziness sensation that lasted approximately 1 minute but patient also states she was having increased activity along with stress at that time.  Patient has not experienced this episode since that time.  Loop recorder has not shown atrial fibrillation thus far.  Factor VII and VIII were repeated at previous visit which did show normal levels but was recommended to repeat these labs at this visit.  Patient returns today for six-month follow-up and overall doing well.  Denies new or worsening stroke/TIA symptoms.  Update 02/11/2019 : Patient returns for follow-up today after last visit a year ago.  She was [facility]/07[facility]2019 with another recurrent cryptogenic stroke.  She presented with recurrent right hand weakness  and numbness with dropping things at a grocery store from her right hand.  This occurred several times and hence she came to the hospital for evaluation.  MRI scan of the brain was obtained which showed several scattered punctate acute infarcts in the left posterior frontal region affecting the precentral gyrus.  There were old infarcts noted involving left upper temporal lobe, deep insula and temporoparietal  junction consistent with old left MCA infarcts.  CT angiogram showed no significant extracranial stenosis.  There is heavy calcification at both cavernous carotids with 50 to 75% stenosis but no significant intracranial stenosis.  LDL cholesterol 56 mg percent.  Hemoglobin A1c was 8.8.  Patient had a loop recorder inserted in the past which was interrogated and did not show any paroxysmal A. fib.  2D echo showed mild left atrial dilatation but normal ejection fraction.  Patient was advised dual antiplatelet therapy of aspirin and Plavix for 3 weeks which she took and is now stopped Plavix.  She is on aspirin 81 mg daily which is tolerating well without bruising or bleeding.  She feels that her right hand weakness is improved but she still has some residual right hand numbness.  She still feels her writing is poor though his strength is improved and she is able to grip objects better in the right hand.  She has had no further recurrent stroke or TIA symptoms.  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  She has no new complaints today.  She states that she is eating healthy and her fasting sugars have all been in the low 100s.  She is also started exercising wants to lose weight.  She has been quite compliant with using her CPAP every night also.  Patient had elevated factor VIII activity slightly at 186-year ago but when it was repeated in July 2019 it had come down to 148 which was barely above upper limits and hence hematology referral was not made   REVIEW OF SYSTEMS: Full 14 system review of systems performed and notable only for those listed below and in HPI above, all others are negative: Numbness, weakness, sleepiness      The following represents the patient's updated allergies and side effects list: Allergies  Allergen Reactions   Canagliflozin Other (See Comments)    Decreased renal function, dizziness,  and extremely lethargy   Demerol Nausea And Vomiting   Excedrin Extra Strength [Aspirin-Acetaminophen-Caffeine] Other (See Comments)    Weird feeling   Janumet [Sitagliptin-Metformin Hcl] Other (See Comments)    Bloating and gas   Meperidine Nausea And Vomiting   Morphine And Related Other (See Comments)    "Weird feelings"   Novocain [Procaine Hcl] Nausea And Vomiting    Per pt "I never had a problem with lidocaine, just novocain."   Procaine Other (See Comments)    Reaction not noted    The neurologically relevant items on the patient's problem list were reviewed on today's visit.  Neurologic Examination  A problem focused neurological exam (12 or more points of the single system neurologic examination, vital signs counts as 1 point, cranial nerves count for 8 points) was performed.  Blood pressure 131/68, pulse 62, temperature (!) 96.6 F (35.9 C), weight 132.2 kg.  General -obese pleasant middle-aged African-American female, in no apparent distress.  Ophthalmologic - Sharp disc margins OU.  Cardiovascular - Regular rate and rhythm.  Mental Status -  Level of arousal and orientation to time, place, and person were intact. Language including expression, naming, repetition, comprehension was assessed and found intact. Fund of Knowledge was assessed and was intact  Cranial Nerves II - XII - II - Visual field intact OU. III, IV, VI - Extraocular movements intact. V - Facial sensation intact bilaterally. VII - Facial movement intact bilaterally VIII - Hearing & vestibular intact bilaterally. X - Palate elevates symmetrically. XI - Chin turning & shoulder shrug intact bilaterally. XII - Tongue protrusion intact.  Motor Strength - The patients strength was normal in all extremities and pronator drift was absent.  Bulk was normal and fasciculations were absent.  Minimally diminished fine finger movements on the right.  Orbits left over right upper extremity. Motor  Tone - Muscle tone was assessed at the neck and appendages and was normal.  Reflexes - The patients reflexes were 1+ in all extremities and she had no pathological reflexes.  Sensory - Light touch, temperature/pinprick, vibration and proprioception, and Romberg testing were assessed and were normal.  Subjective paresthesias in the right hand but no objective sensory loss.  Coordination - The patient had normal movements in the hands and feet with no ataxia or dysmetria.  Tremor was absent.  Gait and Station - The patient's transfers, posture, gait, station, and turns were observed as normal.  Able to perform tandem walking with slight difficulty only.     Assessment: , she is a 68 y.o. African American female with PMH of diabetes mellitus, hypertension, hyperlipidemia, previous strokes in left MCA in 2002 and right frontal in 2011, obesity, and fibromyalgia was admitted on 01/02/16 for punctate left frontal cortical infarct, MRA mild to moderate narrowing of the right ICA. CUS, TTE and DVT negative. TEE showed redundant chordae tendinea, thin mobile density anterior MV could be small ruptured chordae tendinae but not rule out small vegetation. However, pt had no sign of endocarditis. TEE also showed positive PFO with significant right to left shunt. Loop recorder placed. LDL 96 and A1C 9.9. She was discharged with DAPT as she failed plavix PTA. Her lipitor increased from 20 to 40mg . Symptoms resolved and she was discharged home. During the interval time, the patient has been doing well. Glucose fluctuates at home. Enrolled to RESPECT ESUS trial and finished.  She had left MCA infarct in 2002 in her 83s, right frontal in 2011 and left frontal cortical in 12/2015. She had stroke first in young age. TEE showed abnormal MV thin mobile density and positive PFO. She denied any DVT, but she had one miscarriage out of 5 pregnancies. She has family hx of DVT. Loop negative for afib. Repeat TEE showed no more MV  mobile density but still has ASA and PFO. Hypercoagulable panel on 04/04/17 shows increased Factor VIII at 245, as well as slightly elevated factor VII antigen. Will repeat labs and consider sending patient to hematology if repeat level remains increased.  However, he does have multiple stroke risk factors, including DM, HTN, HLD, obesity.  Recurrent cryptogenic left frontal MCA branch infarct in June 2020 with again unremarkable evaluation except elevated hemoglobin A1c of 8.8.  Plan:  -I had a long d/w patient about her recent recurrent cryptogenic stroke, risk for recurrent stroke/TIAs, personally independently reviewed imaging studies and stroke evaluation results and answered questions.Continue aspirin 81 mg daily  for secondary stroke prevention and maintain strict control of hypertension with blood pressure goal below 130/90, diabetes with hemoglobin A1c goal below 6.5% and lipids with LDL  cholesterol goal below 70 mg/dL. I also advised the patient to eat a healthy diet with plenty of whole grains, cereals, fruits and vegetables, exercise regularly and maintain ideal body weight She was counselled to be compliant with her CPAP daily as well.Followup in the future with my nurse practitioner Shanda BumpsJessica in 6 months or call earlier if necessary  I spent more than 25 minutes of face to face time with the patient. Greater than 50% of time was spent in counseling and coordination of care about her recurrent cryptogenic strokes and need for aggressive risk factor modification for stroke prevention in the future.     Delia HeadyPramod Joseeduardo Brix, MD [facility] [address] [place], [place] [id number]  Phone [phone] Fax [phone] Note: This document was prepared with digital dictation and possible smart phrase technology. Any transcriptional errors that result from this process are unintentional.

## 2019-02-11 NOTE — Patient Instructions (Signed)
I had a long d/w patient about her recent recurrent cryptogenic stroke, risk for recurrent stroke/TIAs, personally independently reviewed imaging studies and stroke evaluation results and answered questions.Continue aspirin 81 mg daily  for secondary stroke prevention and maintain strict control of hypertension with blood pressure goal below 130/90, diabetes with hemoglobin A1c goal below 6.5% and lipids with LDL cholesterol goal below 70 mg/dL. I also advised the patient to eat a healthy diet with plenty of whole grains, cereals, fruits and vegetables, exercise regularly and maintain ideal body weight She was counselled to be compliant with her CPAP daily as well.Followup in the future with my nurse practitioner Shanda BumpsJessica in 6 months or call earlier if necessary  Stroke Prevention Some medical conditions and behaviors are associated with a higher chance of having a stroke. You can help prevent a stroke by making nutrition, lifestyle, and other changes, including managing any medical conditions you may have. What nutrition changes can be made?   Eat healthy foods. You can do this by: ? Choosing foods high in fiber, such as fresh fruits and vegetables and whole grains. ? Eating at least 5 or more servings of fruits and vegetables a day. Try to fill half of your plate at each meal with fruits and vegetables. ? Choosing lean protein foods, such as lean cuts of meat, poultry without skin, fish, tofu, beans, and nuts. ? Eating low-fat dairy products. ? Avoiding foods that are high in salt (sodium). This can help lower blood pressure. ? Avoiding foods that have saturated fat, trans fat, and cholesterol. This can help prevent high cholesterol. ? Avoiding processed and premade foods.  Follow your health care provider's specific guidelines for losing weight, controlling high blood pressure (hypertension), lowering high cholesterol, and managing diabetes. These may include: ? Reducing your daily calorie intake.  ? Limiting your daily sodium intake to 1,500 milligrams (mg). ? Using only healthy fats for cooking, such as olive oil, canola oil, or sunflower oil. ? Counting your daily carbohydrate intake. What lifestyle changes can be made?  Maintain a healthy weight. Talk to your health care provider about your ideal weight.  Get at least 30 minutes of moderate physical activity at least 5 days a week. Moderate activity includes brisk walking, biking, and swimming.  Do not use any products that contain nicotine or tobacco, such as cigarettes and e-cigarettes. If you need help quitting, ask your health care provider. It may also be helpful to avoid exposure to secondhand smoke.  Limit alcohol intake to no more than 1 drink a day for nonpregnant women and 2 drinks a day for men. One drink equals 12 oz of beer, 5 oz of wine, or 1 oz of hard liquor.  Stop any illegal drug use.  Avoid taking birth control pills. Talk to your health care provider about the risks of taking birth control pills if: ? You are over 67 years old. ? You smoke. ? You get migraines. ? You have ever had a blood clot. What other changes can be made?  Manage your cholesterol levels. ? Eating a healthy diet is important for preventing high cholesterol. If cholesterol cannot be managed through diet alone, you may also need to take medicines. ? Take any prescribed medicines to control your cholesterol as told by your health care provider.  Manage your diabetes. ? Eating a healthy diet and exercising regularly are important parts of managing your blood sugar. If your blood sugar cannot be managed through diet and exercise, you may need  to take medicines. ? Take any prescribed medicines to control your diabetes as told by your health care provider.  Control your hypertension. ? To reduce your risk of stroke, try to keep your blood pressure below 130/80. ? Eating a healthy diet and exercising regularly are an important part of  controlling your blood pressure. If your blood pressure cannot be managed through diet and exercise, you may need to take medicines. ? Take any prescribed medicines to control hypertension as told by your health care provider. ? Ask your health care provider if you should monitor your blood pressure at home. ? Have your blood pressure checked every year, even if your blood pressure is normal. Blood pressure increases with age and some medical conditions.  Get evaluated for sleep disorders (sleep apnea). Talk to your health care provider about getting a sleep evaluation if you snore a lot or have excessive sleepiness.  Take over-the-counter and prescription medicines only as told by your health care provider. Aspirin or blood thinners (antiplatelets or anticoagulants) may be recommended to reduce your risk of forming blood clots that can lead to stroke.  Make sure that any other medical conditions you have, such as atrial fibrillation or atherosclerosis, are managed. What are the warning signs of a stroke? The warning signs of a stroke can be easily remembered as BEFAST.  B is for balance. Signs include: ? Dizziness. ? Loss of balance or coordination. ? Sudden trouble walking.  E is for eyes. Signs include: ? A sudden change in vision. ? Trouble seeing.  F is for face. Signs include: ? Sudden weakness or numbness of the face. ? The face or eyelid drooping to one side.  A is for arms. Signs include: ? Sudden weakness or numbness of the arm, usually on one side of the body.  S is for speech. Signs include: ? Trouble speaking (aphasia). ? Trouble understanding.  T is for time. ? These symptoms may represent a serious problem that is an emergency. Do not wait to see if the symptoms will go away. Get medical help right away. Call your local emergency services (911 in the [place] Do not drive yourself to the hospital.  Other signs of stroke may include: ? A sudden, severe headache with  no known cause. ? Nausea or vomiting. ? Seizure. Where to find more information For more information, visit:  American Stroke Association: www.strokeassociation.org  National Stroke Association: www.stroke.org Summary  You can prevent a stroke by eating healthy, exercising, not smoking, limiting alcohol intake, and managing any medical conditions you may have.  Do not use any products that contain nicotine or tobacco, such as cigarettes and e-cigarettes. If you need help quitting, ask your health care provider. It may also be helpful to avoid exposure to secondhand smoke.  Remember BEFAST for warning signs of stroke. Get help right away if you or a loved one has any of these signs. This information is not intended to replace advice given to you by your health care provider. Make sure you discuss any questions you have with your health care provider. Document Released: 08/25/2004 Document Revised: 06/30/2017 Document Reviewed: 08/23/2016 Elsevier Patient Education  2020 [employer].

## 2019-02-12 NOTE — Progress Notes (Signed)
Carelink Summary Report / Loop Recorder 

## 2019-03-13 ENCOUNTER — Ambulatory Visit (INDEPENDENT_AMBULATORY_CARE_PROVIDER_SITE_OTHER): Payer: BC Managed Care – PPO | Admitting: *Deleted

## 2019-03-13 DIAGNOSIS — I63412 Cerebral infarction due to embolism of left middle cerebral artery: Secondary | ICD-10-CM | POA: Diagnosis not present

## 2019-03-14 LAB — CUP PACEART REMOTE DEVICE CHECK
Date Time Interrogation Session: 20200812153828
Implantable Pulse Generator Implant Date: 20170606

## 2019-03-22 NOTE — Progress Notes (Signed)
Carelink Summary Report / Loop Recorder 

## 2019-04-11 ENCOUNTER — Ambulatory Visit
Admission: RE | Admit: 2019-04-11 | Discharge: 2019-04-11 | Disposition: A | Discharge status: Home / Self Care | Payer: BC Managed Care – PPO | Source: Ambulatory Visit | Attending: Internal Medicine | Admitting: Internal Medicine

## 2019-04-11 ENCOUNTER — Other Ambulatory Visit: Payer: Self-pay

## 2019-04-11 DIAGNOSIS — E2839 Other primary ovarian failure: Secondary | ICD-10-CM

## 2019-04-15 ENCOUNTER — Ambulatory Visit (INDEPENDENT_AMBULATORY_CARE_PROVIDER_SITE_OTHER): Payer: BC Managed Care – PPO | Admitting: *Deleted

## 2019-04-15 DIAGNOSIS — I63412 Cerebral infarction due to embolism of left middle cerebral artery: Secondary | ICD-10-CM

## 2019-04-15 LAB — CUP PACEART REMOTE DEVICE CHECK
Date Time Interrogation Session: 20200914154140
Implantable Pulse Generator Implant Date: 20170606

## 2019-04-26 NOTE — Progress Notes (Signed)
Carelink Summary Report / Loop Recorder 

## 2019-05-20 ENCOUNTER — Ambulatory Visit (INDEPENDENT_AMBULATORY_CARE_PROVIDER_SITE_OTHER): Payer: Medicare Other | Admitting: *Deleted

## 2019-05-20 DIAGNOSIS — I63412 Cerebral infarction due to embolism of left middle cerebral artery: Secondary | ICD-10-CM

## 2019-05-20 LAB — CUP PACEART REMOTE DEVICE CHECK
Date Time Interrogation Session: 20201017154003
Implantable Pulse Generator Implant Date: 20170606

## 2019-06-11 NOTE — Progress Notes (Signed)
Carelink Summary Report / Loop Recorder 

## 2019-06-20 ENCOUNTER — Ambulatory Visit (INDEPENDENT_AMBULATORY_CARE_PROVIDER_SITE_OTHER): Payer: Medicare Other | Admitting: *Deleted

## 2019-06-20 DIAGNOSIS — I63412 Cerebral infarction due to embolism of left middle cerebral artery: Secondary | ICD-10-CM

## 2019-06-20 LAB — CUP PACEART REMOTE DEVICE CHECK
Date Time Interrogation Session: 20201119134604
Implantable Pulse Generator Implant Date: 20170606

## 2019-07-15 ENCOUNTER — Telehealth: Payer: Self-pay | Admitting: *Deleted

## 2019-07-15 NOTE — Telephone Encounter (Signed)
Carelink alert received for LINQ at [facility] as of 07/12/19. Spoke with patient. She is unsure if she wishes to have LINQ explanted. Agrees to call us back if she decides to proceed, direct DC number given. Aware that monitoring will be discontinued, return kit ordered.   Unenrolled from [place], return kit ordered to confirmed home address.

## 2019-07-19 NOTE — Progress Notes (Signed)
Carelink Summary Report / Loop Recorder 

## 2019-08-14 ENCOUNTER — Telehealth: Payer: Self-pay | Admitting: *Deleted

## 2019-08-14 ENCOUNTER — Encounter: Payer: Self-pay | Admitting: Adult Health

## 2019-08-14 ENCOUNTER — Telehealth (INDEPENDENT_AMBULATORY_CARE_PROVIDER_SITE_OTHER): Payer: Medicare PPO | Admitting: Adult Health

## 2019-08-14 DIAGNOSIS — R29818 Other symptoms and signs involving the nervous system: Secondary | ICD-10-CM

## 2019-08-14 DIAGNOSIS — I639 Cerebral infarction, unspecified: Secondary | ICD-10-CM | POA: Diagnosis not present

## 2019-08-14 DIAGNOSIS — R791 Abnormal coagulation profile: Secondary | ICD-10-CM

## 2019-08-14 DIAGNOSIS — Z794 Long term (current) use of insulin: Secondary | ICD-10-CM

## 2019-08-14 DIAGNOSIS — I10 Essential (primary) hypertension: Secondary | ICD-10-CM | POA: Diagnosis not present

## 2019-08-14 DIAGNOSIS — E785 Hyperlipidemia, unspecified: Secondary | ICD-10-CM

## 2019-08-14 DIAGNOSIS — E1165 Type 2 diabetes mellitus with hyperglycemia: Secondary | ICD-10-CM

## 2019-08-14 DIAGNOSIS — R42 Dizziness and giddiness: Secondary | ICD-10-CM

## 2019-08-14 NOTE — Progress Notes (Signed)
STROKE NEUROLOGY FOLLOW UP NOTE  NAME: Catherine Bautista DOB: 1950-08-17  REASON FOR VISIT: stroke follow up HISTORY FROM: pt and chart  Virtual Visit via Video Note  I connected with Catherine Bautista on 08/14/19 at  9:45 AM EST by a video enabled telemedicine application located remotely in my own home and verified that I am speaking with the correct person using two identifiers who was located at their own home.   I discussed the limitations of evaluation and management by telemedicine and the availability of in person appointments. The patient expressed understanding and agreed to proceed.   History Summary Ms. Catherine Bautista is a 69 y.o. female with history of diabetes mellitus, hypertension, hyperlipidemia, previous cryptogenic strokes left MCA infarct 2002 and a right frontal cortical infarct 2011, obesity, and fibromyalgia was admitted on 01/02/16 for right upper extremity numbness and weakness. MRI showed small left frontal cortical infarct, MRA mild to moderate narrowing of the right ICA. CUS, TTE and DVT negative. TEE showed redundant chordae tendinea, thin mobile density anterior MV could be small ruptured chordae tendinae but not rule out small vegetation. However, pt had no sign of endocarditis. Loop recorder placed. LDL 96 and A1C 9.9. She was discharged with DAPT as she failed plavix PTA. Her lipitor increased from 20 to 40mg . Symptoms resolved and she was discharged home.    03/22/16 follow-up - the patient has been doing well. No stroke like symptoms. BP 104/58. Glucose fluctuates at home as her lantus changed to levemir. On DAPT. Interested in RESPECT ESUS trial.   04/04/17 follow-up - During the interval time, patient has been doing well, no stroke like symptoms. Finished RESPECT ESUS trial and now on plavix. BP today 104/66 and glucose at home in better control with Januvia and Levemir.  08/08/17 follow up: Overall doing well. No complaints at todays visit. States A1c currently 7.1 which patient  is happy with as it was previously >9. Pt states she has been eating healthy and taking her medications as prescribed. LDL in good limits per patient but unable to state exact number. Continues to take Lipitor without side effects. BP today 138/70 which per patient is high, at home usually SBP 110-120's. Continues to take Plavix without side effects. Loop recorder w/o AF episodes. Continues to wear CPAP for sleep apnea. No new CVA/TIA symptoms.   02/05/18 UPDATE: Patient returns today for six-month follow-up and overall is doing well.  She continues to take Plavix without side effects of bleeding or bruising.  Continues to take atorvastatin without side effects myalgias.  Recent lab work done by PCP with LDL 72.  Recent A1c 7.6.  Patient states she has been changing her diet and attempting to eat healthier.  Blood pressure satisfactory 129/64.  She does have a follow-up visit with Dr. 04/08/18 to check on OSA with CPAP.  She was told by [facility] that her machine is over 30 years old and may possibly need to look into obtaining a new one.  She did have one episode last week of a dizziness sensation that lasted approximately 1 minute but patient also states she was having increased activity along with stress at that time.  Patient has not experienced this episode since that time.  Loop recorder has not shown atrial fibrillation thus far.  Factor VII and VIII were repeated at previous visit which did show normal levels but was recommended to repeat these labs at this visit.  Patient returns today for six-month  follow-up and overall doing well.  Denies new or worsening stroke/TIA symptoms.  Update 02/11/2019 PS : Patient returns for follow-up today after last visit a year ago.  She was readmitted to [facility] on 01/07/2019 with another recurrent cryptogenic stroke.  She presented with recurrent right hand weakness and numbness with dropping things at a grocery store from her right hand.  This occurred  several times and hence she came to the hospital for evaluation.  MRI scan of the brain was obtained which showed several scattered punctate acute infarcts in the left posterior frontal region affecting the precentral gyrus.  There were old infarcts noted involving left upper temporal lobe, deep insula and temporoparietal junction consistent with old left MCA infarcts.  CT angiogram showed no significant extracranial stenosis.  There is heavy calcification at both cavernous carotids with 50 to 75% stenosis but no significant intracranial stenosis.  LDL cholesterol 56 mg percent.  Hemoglobin A1c was 8.8.  Patient had a loop recorder inserted in the past which was interrogated and did not show any paroxysmal A. fib.  2D echo showed mild left atrial dilatation but normal ejection fraction.  Patient was advised dual antiplatelet therapy of aspirin and Plavix for 3 weeks which she took and is now stopped Plavix.  She is on aspirin 81 mg daily which is tolerating well without bruising or bleeding.  She feels that her right hand weakness is improved but she still has some residual right hand numbness.  She still feels her writing is poor though his strength is improved and she is able to grip objects better in the right hand.  She has had no further recurrent stroke or TIA symptoms.  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  She has no new complaints today.  She states that she is eating healthy and her fasting sugars have all been in the low 100s.  She is also started exercising wants to lose weight.  She has been quite compliant with using her CPAP every night also.  Patient had elevated factor VIII activity slightly at 186-year ago but when it was repeated in July 2019 it had come down to 148 which was barely above upper limits and hence hematology referral was not made  Virtual visit 08/14/2019: [name] is a 69 year old female  who is being seen today via virtual visit for stroke follow-up.  She reports over the past 3 weeks she has been experiencing dizziness and lightheadedness sensation initially intermittently but has been almost daily recently.  At times, feels as though room is spinning and may lose balance while standing.  She denies any visual impairment or loss of vision.  She does report having history of visual aura migraines with zigzag patterns in her periphery and she will occasionally experiences auras with her dizziness episodes but are not consistent and does not experience any headache after.  She does report having approximately 2 migraines since Christmas where she will have to lay down and rest.  Dizziness/lightheadedness sensation last for approximately 1 minute and then resolves.  Unable to verify specific position or movements causing symptoms as they can occur while she is standing, walking, laying down, watching TV or bending down to pick something up.  She also reports worsening left hand pain/weakness which she feels is also had as she previously had greater difficulty with right hand poststroke.  She does report history of  carpal tunnel and has since been using braces at night with improvement.  She denies any associated weakness, slurred speech, visual loss, headache, ataxic gait or presyncope, shortness of breath or palpitation sensation.  She denies any changes in medications or any recent illness.  She does not routinely monitor blood pressure at home but typically 120s/60s.  She does report history of anemia and fibromyalgia and questions whether symptoms could be in relation. Catherine Bautista as of 07/12/2019.  Catherine currently still in place as she did not wish to pursue extraction at this time.  She continues on aspirin 81 mg daily and atorvastatin 40 mg daily for secondary stroke prevention without side effects.  No further concerns at this time.     REVIEW OF SYSTEMS: Full 14 system review of systems  performed and notable only for those listed below and in HPI above, all others are negative: Numbness, weakness, dizziness      The following represents the patient's updated allergies and side effects list: Allergies  Allergen Reactions  . Canagliflozin Other (See Comments)    Decreased renal function, dizziness, and extremely lethargy  . Demerol Nausea And Vomiting  . Excedrin Extra Strength [Aspirin-Acetaminophen-Caffeine] Other (See Comments)    Weird feeling  . Janumet [Sitagliptin-Metformin Hcl] Other (See Comments)    Bloating and gas  . Meperidine Nausea And Vomiting  . Morphine And Related Other (See Comments)    "Weird feelings"  . Novocain [Procaine Hcl] Nausea And Vomiting    Per pt "I never had a problem with lidocaine, just novocain."  . Procaine Other (See Comments)    Reaction not noted    The neurologically relevant items on the patient's problem list were reviewed on today's visit.  Physical examination Limited examination due to visit type  General: well developed, well nourished, pleasant middle-aged African-American female, seated, in no evident distress Head: head normocephalic and atraumatic.    Neurologic Exam Mental Status: Awake and fully alert.  Normal speech and language.  Oriented to place and time. Recent and remote memory intact. Attention span, concentration and fund of knowledge appropriate. Mood and affect appropriate.  Cranial Nerves: Extraocular movements full without nystagmus. Hearing intact to voice. Facial sensation intact. Face, tongue, palate moves normally and symmetrically.  Shoulder shrug symmetric. Motor: No evidence of weakness per drift assessment.  Mildly decreased hand tapping bilaterally Sensory.:  Unable to adequately assess with subjective numbness/tingling L>R hands bilaterally Coordination: Rapid alternating movements normal in all extremities except slightly decreased bilateral hands. Finger-to-nose and heel-to-shin  performed accurately bilaterally. Gait and Station: Arises from chair without difficulty. Stance is normal. Gait demonstrates normal stride length and balance .  Reflexes: UTA      Assessment: Bresha Hosack is a 69 y.o. African American female with PMH of diabetes mellitus, hypertension, hyperlipidemia, fibromyalgia, previous cryptogenic strokes in left MCA in 2002, right frontal in 2011, left frontal cortical infarct 2017 and most recently 12/2018 with left frontal MCA branch infarct.  During 2017 stroke admission, TEE did show positive PFO.  Loop recorder placed at that time.  Previously participating in RESPECT ESUS trial which has not been completed. Hypercoagulable panel on 04/04/17 shows increased Factor VIII at 245, as well as slightly elevated factor VII antigen.  Repeat levels 01/2018 only slightly above normal range therefore she was not referred to hematology.  However, he does have multiple stroke risk factors, including DM, HTN, HLD, OSA on CPAP, anemia and obesity.  Catherine Bautista in 07/2019 which had not  shown atrial fibrillation during monitoring timeframe. Residual deficits of mild right hand weakness and pain with recent onset of worsening left hand pain, weakness and numbness/tingling.  She also reports 3-week onset of gradual worsening dizziness/lightheadedness sensation (see HPI)    Plan:  -Recommend obtaining MRI brain wo contrast to rule out acute/subacute stroke or abnormality causing recent onset of symptoms.  She was also advised to monitor dizziness sensation with recording to provide additional information as etiology unknown -Discussed possible etiology of symptoms and will rule out new stroke with MRI along with obtaining lab work including CBC and CMP.  We will also recheck factor VIII as this was previously elevated for surveillance monitoring -Recommend participation in physical therapy but patient would like to hold off on this at this time and is aware to call office if interested  in the future and participating -Advised to continue ongoing compliance with CPAP for OSA management -Continue aspirin 81 mg daily and atorvastatin 40 mg daily for secondary stroke prevention  -Continue to follow with PCP for HTN, HLD and DM management -maintain strict control of hypertension with blood pressure goal below 130/90, diabetes with hemoglobin A1c goal below 6.5% and lipids with LDL cholesterol goal below 70 mg/dL. I also advised the patient to eat a healthy diet with plenty of whole grains, cereals, fruits and vegetables, exercise regularly and maintain ideal body weight    Follow-up in 3 months or call earlier if needed  I spent more than 35 minutes of non face to face time with the patient. Greater than 50% of time was spent in counseling and coordination of care regarding new onset symptoms and possible etiology, discussion regarding  recurrent cryptogenic strokes and need for aggressive risk factor modification for stroke prevention in the future and answered all questions to patient satisfaction.     Ihor Austin, AGNP-BC  [facility] [address] [place], [place] [address]  Phone [phone] Fax [phone] Note: This document was prepared with digital dictation and possible smart phrase technology. Any transcriptional errors that result from this process are unintentional.

## 2019-08-14 NOTE — Progress Notes (Signed)
I agree with the above plan 

## 2019-08-14 NOTE — Telephone Encounter (Signed)
Faxed to [facility] in [place] (lab requests).  Fax confirmation received.

## 2019-08-14 NOTE — Addendum Note (Signed)
Addended by: Raliegh Ip on: 08/14/2019 12:24 PM   Modules accepted: Orders

## 2019-08-16 LAB — CBC
Hematocrit: 35.8 % (ref 34.0–46.6)
Hemoglobin: 11.5 g/dL (ref 11.1–15.9)
MCH: 28 pg (ref 26.6–33.0)
MCHC: 32.1 g/dL (ref 31.5–35.7)
MCV: 87 fL (ref 79–97)
Platelets: 332 10*3/uL (ref 150–450)
RBC: 4.11 x10E6/uL (ref 3.77–5.28)
RDW: 13 % (ref 11.7–15.4)
WBC: 7.8 10*3/uL (ref 3.4–10.8)

## 2019-08-16 LAB — COMPREHENSIVE METABOLIC PANEL
ALT: 38 IU/L — ABNORMAL HIGH (ref 0–32)
AST: 27 IU/L (ref 0–40)
Albumin/Globulin Ratio: 1.4 (ref 1.2–2.2)
Albumin: 3.8 g/dL (ref 3.8–4.8)
Alkaline Phosphatase: 131 IU/L — ABNORMAL HIGH (ref 39–117)
BUN/Creatinine Ratio: 14 (ref 12–28)
BUN: 15 mg/dL (ref 8–27)
Bilirubin Total: 0.3 mg/dL (ref 0.0–1.2)
CO2: 24 mmol/L (ref 20–29)
Calcium: 9 mg/dL (ref 8.7–10.3)
Chloride: 107 mmol/L — ABNORMAL HIGH (ref 96–106)
Creatinine, Ser: 1.09 mg/dL — ABNORMAL HIGH (ref 0.57–1.00)
GFR calc Af Amer: 61 mL/min/{1.73_m2} (ref >59)
GFR calc non Af Amer: 53 mL/min/{1.73_m2} — ABNORMAL LOW (ref >59)
Globulin, Total: 2.7 g/dL (ref 1.5–4.5)
Glucose: 123 mg/dL — ABNORMAL HIGH (ref 65–99)
Potassium: 4.4 mmol/L (ref 3.5–5.2)
Sodium: 141 mmol/L (ref 134–144)
Total Protein: 6.5 g/dL (ref 6.0–8.5)

## 2019-08-16 LAB — FACTOR 8 ASSAY: Factor VIII Activity: 202 % — ABNORMAL HIGH (ref 56–140)

## 2019-08-17 ENCOUNTER — Ambulatory Visit (HOSPITAL_BASED_OUTPATIENT_CLINIC_OR_DEPARTMENT_OTHER): Payer: Medicare PPO

## 2019-08-19 ENCOUNTER — Other Ambulatory Visit: Payer: Self-pay | Admitting: Adult Health

## 2019-08-19 DIAGNOSIS — R791 Abnormal coagulation profile: Secondary | ICD-10-CM

## 2019-08-21 ENCOUNTER — Ambulatory Visit (HOSPITAL_COMMUNITY)
Admission: RE | Admit: 2019-08-21 | Discharge: 2019-08-21 | Disposition: A | Discharge status: Home / Self Care | Payer: Medicare PPO | Source: Ambulatory Visit | Attending: Adult Health | Admitting: Adult Health

## 2019-08-21 ENCOUNTER — Other Ambulatory Visit: Payer: Self-pay

## 2019-08-21 ENCOUNTER — Telehealth: Payer: Self-pay | Admitting: *Deleted

## 2019-08-21 DIAGNOSIS — R42 Dizziness and giddiness: Secondary | ICD-10-CM | POA: Insufficient documentation

## 2019-08-21 DIAGNOSIS — R791 Abnormal coagulation profile: Secondary | ICD-10-CM | POA: Insufficient documentation

## 2019-08-21 DIAGNOSIS — I639 Cerebral infarction, unspecified: Secondary | ICD-10-CM | POA: Diagnosis present

## 2019-08-21 DIAGNOSIS — R29818 Other symptoms and signs involving the nervous system: Secondary | ICD-10-CM | POA: Diagnosis present

## 2019-08-21 NOTE — Telephone Encounter (Signed)
I called pt and relayed that her lab results came back and JM/NP stated that her factor 8 is elevated compared to her last level.  JM/NP recommended to be evaluated by hematology.  Pt had gotten call today about this and made appt 09-06-19 at [facility] hematology in [place].

## 2019-08-21 NOTE — Telephone Encounter (Signed)
-----   Message from Ihor Austin, NP sent at 08/19/2019 12:16 PM EST ----- Please advise patient that recent blood work did show a slightly increased factor VIII compared to prior level.  I do recommend being evaluated by hematology to ensure no further work-up or interventions need to be done along with ongoing monitoring

## 2019-08-26 ENCOUNTER — Telehealth: Payer: Self-pay

## 2019-08-26 NOTE — Telephone Encounter (Signed)
-----   Message from Guy Begin, RN sent at 08/26/2019 10:02 AM EST -----  ----- Message ----- From: Ihor Austin, NP Sent: 08/26/2019   9:26 AM EST To: Guy Begin, RN  Please advise patient that recent imaging did not show any acute abnormalities

## 2019-08-26 NOTE — Telephone Encounter (Signed)
Spoke with the patient and she verbalized understanding her results. No questions or concerns at this time.   

## 2019-09-06 ENCOUNTER — Inpatient Hospital Stay: Payer: Medicare PPO

## 2019-09-06 ENCOUNTER — Other Ambulatory Visit: Payer: Self-pay

## 2019-09-06 ENCOUNTER — Inpatient Hospital Stay: Payer: Medicare PPO | Attending: Family | Admitting: Family

## 2019-09-06 ENCOUNTER — Encounter: Payer: Self-pay | Admitting: Family

## 2019-09-06 VITALS — BP 166/56 | HR 73 | Resp 18 | Ht 66.0 in | Wt 291.1 lb

## 2019-09-06 DIAGNOSIS — D649 Anemia, unspecified: Secondary | ICD-10-CM

## 2019-09-06 DIAGNOSIS — D66 Hereditary factor VIII deficiency: Secondary | ICD-10-CM | POA: Diagnosis present

## 2019-09-06 DIAGNOSIS — Z8051 Family history of malignant neoplasm of kidney: Secondary | ICD-10-CM | POA: Diagnosis not present

## 2019-09-06 DIAGNOSIS — Z832 Family history of diseases of the blood and blood-forming organs and certain disorders involving the immune mechanism: Secondary | ICD-10-CM | POA: Insufficient documentation

## 2019-09-06 DIAGNOSIS — Z8673 Personal history of transient ischemic attack (TIA), and cerebral infarction without residual deficits: Secondary | ICD-10-CM | POA: Diagnosis not present

## 2019-09-06 DIAGNOSIS — E119 Type 2 diabetes mellitus without complications: Secondary | ICD-10-CM | POA: Insufficient documentation

## 2019-09-06 DIAGNOSIS — Z9884 Bariatric surgery status: Secondary | ICD-10-CM | POA: Insufficient documentation

## 2019-09-06 DIAGNOSIS — Z801 Family history of malignant neoplasm of trachea, bronchus and lung: Secondary | ICD-10-CM | POA: Diagnosis not present

## 2019-09-06 DIAGNOSIS — Z90710 Acquired absence of both cervix and uterus: Secondary | ICD-10-CM | POA: Diagnosis not present

## 2019-09-06 DIAGNOSIS — D6859 Other primary thrombophilia: Secondary | ICD-10-CM

## 2019-09-06 DIAGNOSIS — Z7982 Long term (current) use of aspirin: Secondary | ICD-10-CM | POA: Diagnosis not present

## 2019-09-06 DIAGNOSIS — Z794 Long term (current) use of insulin: Secondary | ICD-10-CM | POA: Insufficient documentation

## 2019-09-06 LAB — ANTITHROMBIN III: AntiThromb III Func: 114 % (ref 75–120)

## 2019-09-06 NOTE — Progress Notes (Signed)
Hematology/Oncology Consultation   Name: Catherine Bautista      MRN: [id number]    Location: Room/bed info not found  Date: 09/06/2019 Time:10:19 AM   REFERRING PHYSICIAN: Frann Rider, NP - Neurology   REASON FOR CONSULT: Elevated Factor VIII   DIAGNOSIS: Elevated Factor VIII  HISTORY OF PRESENT ILLNESS: Catherine Bautista is a very pleasant 69 yo [place] American female with history of elevated Factor VIII level as well as history of 4 ischemic strokes.  She states that she had previously been on Plavix but stopped this after her last stroke and was put on 1 baby aspirin a day.  She sees Dr. Leonie Man with Neurology.  She states that in the past she has had 2 gastric bypass surgeries and a hysterectomy without any complications. She states that after surgery she is always put on Lovenox. She was also put on lovenox in the past preventatively for leg pain associated with the varicose veins.  She has 2 sons and history of 1 miscarriage.  She denies any sickle cell disease or trait.  She has a strong family history of stroke on both her mother and fathers sides. Both her maternal and paternal grandmother and mother passed away from strokes.  Both her mother and brother also have history of blood clots. Her brother had renal cancer at the time.  Father had lung cancer.  She has episodes of dizziness/lightheadedness that come and go lasting a few second at a time.  She has history of migraines with a "zig-zag" vision aura. She state that she has had these less often with only around 5 over the last year.  She is diabetic and is on insulin.  No fever, chills, n/v, cough, rash, SOB, chest pain, abdominal pain or changes in bowel or bladder habits.  She states that she rarely has palpitations.  She has had no issues with bleeding. No bruising or petechiae.  No tenderness, numbness or tingling in her extremities.  She has chronic swelling in her right leg due to varicose veins. She states that she has had them stripped  in the past wears a compression stocking regularly.  No falls or syncopal episodes to report.  She states that her appetite has been down. She does feel that she is hydrating well. Her weight is stable.  She does not smoke, use recreational drugs or drink alcoholic beverages.  She does walk regularly for exercise.  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   ROS: All other 10 point review of systems is negative.   PAST MEDICAL HISTORY:   Past Medical History:  Diagnosis Date  . Anemia   . Anemia, vitamin B12 deficiency   . Depression with anxiety   . Diabetes mellitus   . Dyslipidemia   . Fibromyalgia   . Graves disease   . Hypertension   . Obesity   . Stroke ([place])    "I've had 3 strokes"    ALLERGIES: Allergies  Allergen Reactions  . Canagliflozin Other (See Comments)    Decreased renal function, dizziness, and extremely lethargy  . Demerol Nausea And Vomiting  . Excedrin Extra Strength [Aspirin-Acetaminophen-Caffeine] Other (See Comments)    Weird feeling  . Janumet [Sitagliptin-Metformin Hcl] Other (See Comments)    Bloating and gas  . Meperidine Nausea And Vomiting  . Morphine And Related Other (See Comments)    "Weird feelings"  . Novocain [Procaine Hcl] Nausea And Vomiting    Per pt "I never had a problem with  lidocaine, just novocain."  . Procaine Other (See Comments)    Reaction not noted      MEDICATIONS:  Current Outpatient Medications on File Prior to Visit  Medication Sig Dispense Refill  . aspirin EC 81 MG tablet Take 1 tablet (81 mg total) by mouth daily. 150 tablet 0  . atorvastatin (LIPITOR) 40 MG tablet Take 1 tablet (40 mg total) by mouth daily at 6 PM. (Patient taking differently: Take 40 mg by mouth at bedtime. ) 30 tablet 2  . b complex vitamins tablet Take 1 tablet by mouth daily. Reported on 01/13/2016    . Cholecalciferol (VITAMIN D3) 50 MCG (2000 UT) TABS Take 2,000 Units by mouth daily.    . DULoxetine (CYMBALTA) 30 MG capsule Take  30 mg by mouth daily.   4  . ferrous sulfate 325 (65 FE) MG tablet Take 325 mg by mouth daily with breakfast.    . glimepiride (AMARYL) 4 MG tablet Take 4 mg by mouth daily with breakfast.    . hydrochlorothiazide (HYDRODIURIL) 25 MG tablet Take 1 tablet (25 mg total) by mouth daily.    . Insulin Detemir (LEVEMIR FLEXTOUCH) 100 UNIT/ML Pen Inject 48 Units into the skin at bedtime.    Marland Kitchen JANUVIA 100 MG tablet Take 100 mg by mouth daily.  2  . Magnesium 300 MG CAPS Take 300 mg by mouth daily.    . metFORMIN (GLUCOPHAGE-XR) 750 MG 24 hr tablet Take 750 mg by mouth 2 (two) times a day.    . methimazole (TAPAZOLE) 10 MG tablet Take 10 mg by mouth daily.   5  . Multiple Vitamins-Minerals (HAIR/SKIN/NAILS) CAPS Take 1 capsule by mouth daily.     . OMEGA 3 1200 MG CAPS Take 1,200 mg by mouth at bedtime.     . ramipril (ALTACE) 10 MG capsule Take 10 mg by mouth daily.      . vitamin B-12 (CYANOCOBALAMIN) 1000 MCG tablet Take 1,000 mcg by mouth daily.     . vitamin C (ASCORBIC ACID) 500 MG tablet Take 500 mg by mouth daily.     No current facility-administered medications on file prior to visit.     PAST SURGICAL HISTORY Past Surgical History:  Procedure Laterality Date  . ABDOMINAL HYSTERECTOMY  2007  . CHOLECYSTECTOMY     laproscopic  . EP IMPLANTABLE DEVICE N/A 01/05/2016   Procedure: Loop Recorder Insertion;  Surgeon: Will Jorja Loa, MD;  Location: [facility] INVASIVE CV LAB;  Service: Cardiovascular;  Laterality: N/A;  . GASTRIC BYPASS  1981  . TEE WITHOUT CARDIOVERSION N/A 01/05/2016   Procedure: TRANSESOPHAGEAL ECHOCARDIOGRAM (TEE);  Surgeon: Quintella Reichert, MD;  Location: [facility] ENDOSCOPY;  Service: Cardiovascular;  Laterality: N/A;  . TEE WITHOUT CARDIOVERSION N/A 04/18/2017   Procedure: TRANSESOPHAGEAL ECHOCARDIOGRAM (TEE);  Surgeon: Lewayne Bunting, MD;  Location: [facility] ENDOSCOPY;  Service: Cardiovascular;  Laterality: N/A;  . therapuetic abortion  1986   associated with C-section    FAMILY  HISTORY: Family History  Problem Relation Age of Onset  . Stroke Mother   . Heart failure Mother   . Lung cancer Father   . Kidney cancer Brother   . Stroke Cousin     SOCIAL HISTORY:  reports that she has never smoked. She has never used smokeless tobacco. She reports that she does not drink alcohol or use drugs.  PERFORMANCE STATUS: The patient's performance status is 1 - Symptomatic but completely ambulatory  PHYSICAL EXAM: Most Recent Vital Signs: There were no  vitals taken for this visit. There were no vitals taken for this visit.  General Appearance:    Alert, cooperative, no distress, appears stated age  Head:    Normocephalic, without obvious abnormality, atraumatic  Eyes:    PERRL, conjunctiva/corneas clear, EOM's intact, fundi    benign, both eyes        Throat:   Lips, mucosa, and tongue normal; teeth and gums normal  Neck:   Supple, symmetrical, trachea midline, no adenopathy;    thyroid:  no enlargement/tenderness/nodules; no carotid   bruit or JVD  Back:     Symmetric, no curvature, ROM normal, no CVA tenderness  Lungs:     Clear to auscultation bilaterally, respirations unlabored  Chest Wall:    No tenderness or deformity   Heart:    Regular rate and rhythm, S1 and S2 normal, no murmur, rub   or gallop     Abdomen:     Soft, non-tender, bowel sounds active all four quadrants,    no masses, no organomegaly        Extremities:   Extremities normal, atraumatic, no cyanosis or edema  Pulses:   2+ and symmetric all extremities  Skin:   Skin color, texture, turgor normal, no rashes or lesions  Lymph nodes:   Cervical, supraclavicular, and axillary nodes normal  Neurologic:   CNII-XII intact, normal strength, sensation and reflexes    throughout    LABORATORY DATA:  No results found for this or any previous visit (from the past 48 hour(s)).    RADIOGRAPHY: No results found.     PATHOLOGY: None  ASSESSMENT/PLAN: Ms. Konitzer is a very pleasant 69 yo [place]  American female with history of elevated Factor VIII level as well as history of 4 ischemic strokes.  Her Factor VIII assay 2 weeks ago was 202 (has ranged 148-245 over the last 2 years).  We did an extensive lab work up today including hypercoag panel and hemoglobinopathy eval all of which came back negative.  I was able to call and go over all her lab work with her and let her know that she does not need to follow-up.   All questions were answered and she is in agreement with the plan. She can contact our office with any questions or concerns. We can certainly see her for any heme/onc issues they may arise in the future.   She was discussed with and also seen by Dr. Myna Hidalgo and he is in agreement with the aforementioned.   Emeline Gins, NP   Addendum: I agree with the above assessment by Maralyn Sago.  I really do not see that we have to put her on formal anticoagulation.  I think she is already on a baby aspirin daily.  I think she been on Plavix in the past.  It is possible that there might be a hypercoagulable issue.  Again we just cannot find it with our lab work.  We will follow up with her as needed.  I know she is getting great follow-up by neurology.  Christin Bach, MD

## 2019-09-07 LAB — CARDIOLIPIN ANTIBODIES, IGG, IGM, IGA
Anticardiolipin IgA: <9 APL U/mL (ref 0–11)
Anticardiolipin IgG: <9 GPL U/mL (ref 0–14)
Anticardiolipin IgM: <9 MPL U/mL (ref 0–12)

## 2019-09-07 LAB — PROTEIN S ACTIVITY: Protein S Activity: 74 % (ref 63–140)

## 2019-09-07 LAB — LUPUS ANTICOAGULANT PANEL
DRVVT: 34.6 s (ref 0.0–47.0)
PTT Lupus Anticoagulant: 29.7 s (ref 0.0–51.9)

## 2019-09-07 LAB — PROTEIN S, TOTAL: Protein S Ag, Total: 95 % (ref 60–150)

## 2019-09-07 LAB — PROTEIN C ACTIVITY: Protein C Activity: 98 % (ref 73–180)

## 2019-09-08 LAB — PROTEIN C, TOTAL: Protein C, Total: 85 % (ref 60–150)

## 2019-09-08 LAB — BETA-2-GLYCOPROTEIN I ABS, IGG/M/A
Beta-2 Glyco I IgG: <9 GPI IgG units (ref 0–20)
Beta-2-Glycoprotein I IgA: <9 GPI IgA units (ref 0–25)
Beta-2-Glycoprotein I IgM: <9 GPI IgM units (ref 0–32)

## 2019-09-09 ENCOUNTER — Telehealth: Payer: Self-pay | Admitting: Family

## 2019-09-09 LAB — HEMOGLOBINOPATHY EVALUATION
Hgb A2 Quant: 2.2 % (ref 1.8–3.2)
Hgb A: 97.8 % (ref 96.4–98.8)
Hgb C: 0 %
Hgb F Quant: 0 % (ref 0.0–2.0)
Hgb S Quant: 0 %
Hgb Variant: 0 %

## 2019-09-09 LAB — HOMOCYSTEINE: Homocysteine: 14.3 umol/L (ref 0.0–17.2)

## 2019-09-09 NOTE — Telephone Encounter (Signed)
No los 2/5 

## 2019-09-11 LAB — FACTOR 5 LEIDEN

## 2019-09-13 LAB — PROTHROMBIN GENE MUTATION

## 2019-11-12 NOTE — Progress Notes (Signed)
[facility] [address] [place]. [place] [address] [phone]       STROKE FOLLOW UP NOTE  Ms. Catherine Bautista Date of Birth:  [birth year] Medical Record Number:  [id number]   Reason for Referral: stroke follow up    SUBJECTIVE:   CHIEF COMPLAINT:  Chief Complaint  Patient presents with  . Follow-up    patient still reports some dizziness every once in awhile, but not as much as it was in January.   Marland Kitchen Room 9    here alone has mask on    HPI:    HPI:  Catherine Bautista is a 69 year old female who is being seen today, 11/12/2019, for 10-month follow-up with prior history of stroke and prior visit complains of dizziness/lightheadedness sensation as well as worsening left hand weakness.  MRI obtained due to new onset neurological concerns which did not show any acute abnormalities.  Since prior visit, dizziness/lightheadedness sensation has greatly improved and only occurs occasionally.  She does report improvement of prior concerns of left hand weakness and questions whether at times difficulty opening jars could be to bilateral carpal tunnel syndrome or prior strokes.  Continues on aspirin 81 milligrams daily and atorvastatin without side effects.  Blood pressure today 150/68.  She was evaluated by hematology due to continued factor VIII assay and further evaluation with extensive lab work-up all unremarkable.  She also continues to follow with endocrinology for history of Graves' disease.  No further concerns at this time.    History copied for reference purposes only Virtual visit 08/14/2019: Catherine Bautista is a 68 year old female who is being seen today via virtual visit for stroke follow-up.  She reports over the past 3 weeks she has been experiencing dizziness and lightheadedness sensation initially intermittently but has been almost daily recently.  At times, feels as though room is spinning and may lose balance while standing.  She denies any visual impairment or loss of vision.   She does report having history of visual aura migraines with zigzag patterns in her periphery and she will occasionally experiences auras with her dizziness episodes but are not consistent and does not experience any headache after.  She does report having approximately 2 migraines since Christmas where she will have to lay down and rest.  Dizziness/lightheadedness sensation last for approximately 1 minute and then resolves.  Unable to verify specific position or movements causing symptoms as they can occur while she is standing, walking, laying down, watching TV or bending down to pick something up.  She also reports worsening left hand pain/weakness which she feels is also had as she previously had greater difficulty with right hand poststroke.  She does report history of carpal tunnel and has since been using braces at night with improvement.  She denies any associated weakness, slurred speech, visual loss, headache, ataxic gait or presyncope, shortness of breath or palpitation sensation.  She denies any changes in medications or any recent illness.  She does not routinely monitor blood pressure at home but typically 120s/60s.  She does report history of anemia and fibromyalgia and questions whether symptoms could be in relation. ILR at [facility] as of 07/12/2019.  ILR currently still in place as she did not wish to pursue extraction at this time.  She continues on aspirin 81 mg daily and atorvastatin 40 mg daily for secondary stroke prevention without side effects.  No further concerns at this time.  Update 02/11/2019 Dr. Pearlean Brownie : Patient returns for follow-up today after last visit  a year ago.  She was readmitted to [facility] on 01/07/2019 with another recurrent cryptogenic stroke.  She presented with recurrent right hand weakness and numbness with dropping things at a grocery store from her right hand.  This occurred several times and hence she came to the hospital for evaluation.  MRI scan of the brain was  obtained which showed several scattered punctate acute infarcts in the left posterior frontal region affecting the precentral gyrus.  There were old infarcts noted involving left upper temporal lobe, deep insula and temporoparietal junction consistent with old left MCA infarcts.  CT angiogram showed no significant extracranial stenosis.  There is heavy calcification at both cavernous carotids with 50 to 75% stenosis but no significant intracranial stenosis.  LDL cholesterol 56 mg percent.  Hemoglobin A1c was 8.8.  Patient had a loop recorder inserted in the past which was interrogated and did not show any paroxysmal A. fib.  2D echo showed mild left atrial dilatation but normal ejection fraction.  Patient was advised dual antiplatelet therapy of aspirin and Plavix for 3 weeks which she took and is now stopped Plavix.  She is on aspirin 81 mg daily which is tolerating well without bruising or bleeding.  She feels that her right hand weakness is improved but she still has some residual right hand numbness.  She still feels her writing is poor though his strength is improved and she is able to grip objects better in the right hand.  She has had no further recurrent stroke or TIA symptoms.  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  She has no new complaints today.  She states that she is eating healthy and her fasting sugars have all been in the low 100s.  She is also started exercising wants to lose weight.  She has been quite compliant with using her CPAP every night also.  Patient had elevated factor VIII activity slightly at 186-year ago but when it was repeated in July 2019 it had come down to 148 which was barely above upper limits and hence hematology referral was not made  History Summary Catherine Bautista is a 69 y.o. female with history of diabetes mellitus, hypertension, hyperlipidemia, previous cryptogenic strokes left  MCA infarct 2002 and a right frontal cortical infarct 2011, obesity, and fibromyalgia was admitted on 01/02/16 for right upper extremity numbness and weakness. MRI showed small left frontal cortical infarct, MRA mild to moderate narrowing of the right ICA. CUS, TTE and DVT negative. TEE showed redundant chordae tendinea, thin mobile density anterior MV could be small ruptured chordae tendinae but not rule out small vegetation. However, pt had no sign of endocarditis. Loop recorder placed. LDL 96 and A1C 9.9. She was discharged with DAPT as she failed plavix PTA. Her lipitor increased from 20 to 40mg . Symptoms resolved and she was discharged home.         ROS:   14 system review of systems performed and negative with exception of no complaints  PMH:  Past Medical History:  Diagnosis Date  . Anemia   . Anemia, vitamin B12 deficiency   . Depression with anxiety   . Diabetes mellitus   . Dyslipidemia   . Fibromyalgia   . Graves disease   . Hypertension   . Obesity   . Stroke ([place])    "I've had 3 strokes"    PSH:  Past  Surgical History:  Procedure Laterality Date  . ABDOMINAL HYSTERECTOMY  2007  . CHOLECYSTECTOMY     laproscopic  . EP IMPLANTABLE DEVICE N/A 01/05/2016   Procedure: Loop Recorder Insertion;  Surgeon: Will Jorja Loa, MD;  Location: [facility] INVASIVE CV LAB;  Service: Cardiovascular;  Laterality: N/A;  . GASTRIC BYPASS  1981  . TEE WITHOUT CARDIOVERSION N/A 01/05/2016   Procedure: TRANSESOPHAGEAL ECHOCARDIOGRAM (TEE);  Surgeon: Quintella Reichert, MD;  Location: [facility] ENDOSCOPY;  Service: Cardiovascular;  Laterality: N/A;  . TEE WITHOUT CARDIOVERSION N/A 04/18/2017   Procedure: TRANSESOPHAGEAL ECHOCARDIOGRAM (TEE);  Surgeon: Lewayne Bunting, MD;  Location: [facility] ENDOSCOPY;  Service: Cardiovascular;  Laterality: N/A;  . therapuetic abortion  1986   associated with C-section    Social History:  Social History   Socioeconomic History  . Marital status: Married    Spouse name: Not  on file  . Number of children: Not on file  . Years of education: Not on file  . Highest education level: Not on file  Occupational History  . Not on file  Tobacco Use  . Smoking status: Never Smoker  . Smokeless tobacco: Never Used  Substance and Sexual Activity  . Alcohol use: No  . Drug use: No  . Sexual activity: Not on file  Other Topics Concern  . Not on file  Social History Narrative   Lives at home with husband   Right handed   Caffeine: green tea and regular tea, about 24 oz daily   Social Determinants of Health   Financial Resource Strain:   . Difficulty of Paying Living Expenses:   Food Insecurity:   . Worried About Programme researcher, broadcasting/film/video in the Last Year:   . Barista in the Last Year:   Transportation Needs:   . Freight forwarder (Medical):   Marland Kitchen Lack of Transportation (Non-Medical):   Physical Activity:   . Days of Exercise per Week:   . Minutes of Exercise per Session:   Stress:   . Feeling of Stress :   Social Connections:   . Frequency of Communication with Friends and Family:   . Frequency of Social Gatherings with Friends and Family:   . Attends Religious Services:   . Active Member of Clubs or Organizations:   . Attends Banker Meetings:   Marland Kitchen Marital Status:   Intimate Partner Violence:   . Fear of Current or Ex-Partner:   . Emotionally Abused:   Marland Kitchen Physically Abused:   . Sexually Abused:     Family History:  Family History  Problem Relation Age of Onset  . Stroke Mother   . Heart failure Mother   . Lung cancer Father   . Kidney cancer Brother   . Stroke Cousin     Medications:   Current Outpatient Medications on File Prior to Visit  Medication Sig Dispense Refill  . ACCU-CHEK SMARTVIEW test strip USE TO CHECK YOUR BLOOD SUGAR THREE TIMES A DAY DX E13.9 IN VITRO 90 DAYS    . aspirin EC 81 MG tablet Take 1 tablet (81 mg total) by mouth daily. 150 tablet 0  . atorvastatin (LIPITOR) 40 MG tablet Take 1 tablet (40 mg  total) by mouth daily at 6 PM. (Patient taking differently: Take 40 mg by mouth at bedtime. ) 30 tablet 2  . b complex vitamins tablet Take 1 tablet by mouth daily. Reported on 01/13/2016    . BD PEN NEEDLE NANO 2ND GEN 32G X 4  MM MISC USE TO TEST BLOOD SUGAR 3 TIMES DAILY    . Cholecalciferol (VITAMIN D3) 50 MCG (2000 UT) TABS Take 2,000 Units by mouth daily.    . DULoxetine (CYMBALTA) 30 MG capsule Take 30 mg by mouth daily.   4  . ferrous sulfate 325 (65 FE) MG tablet Take 325 mg by mouth daily with breakfast.    . glimepiride (AMARYL) 4 MG tablet Take 4 mg by mouth daily with breakfast.    . hydrochlorothiazide (HYDRODIURIL) 25 MG tablet Take 1 tablet (25 mg total) by mouth daily.    . Insulin Detemir (LEVEMIR FLEXTOUCH) 100 UNIT/ML Pen Inject 48 Units into the skin at bedtime.    Marland Kitchen. JANUVIA 100 MG tablet Take 100 mg by mouth daily.  2  . Magnesium 300 MG CAPS Take 300 mg by mouth daily.    . metFORMIN (GLUCOPHAGE-XR) 750 MG 24 hr tablet Take 750 mg by mouth 2 (two) times a day.    . Multiple Vitamins-Minerals (HAIR/SKIN/NAILS) CAPS Take 1 capsule by mouth daily.     . OMEGA 3 1200 MG CAPS Take 1,200 mg by mouth at bedtime.     . ramipril (ALTACE) 10 MG capsule Take 10 mg by mouth daily.      . traMADol (ULTRAM) 50 MG tablet Take 1 tablet by mouth as needed.    . vitamin B-12 (CYANOCOBALAMIN) 1000 MCG tablet Take 1,000 mcg by mouth daily.     . vitamin C (ASCORBIC ACID) 500 MG tablet Take 500 mg by mouth daily.     No current facility-administered medications on file prior to visit.    Allergies:   Allergies  Allergen Reactions  . Canagliflozin Other (See Comments)    Decreased renal function, dizziness, and extremely lethargy  . Demerol Nausea And Vomiting  . Excedrin Extra Strength [Aspirin-Acetaminophen-Caffeine] Other (See Comments)    Weird feeling  . Janumet [Sitagliptin-Metformin Hcl] Other (See Comments)    Bloating and gas  . Meperidine Nausea And Vomiting  . Morphine And  Related Other (See Comments)    "Weird feelings"  . Novocain [Procaine Hcl] Nausea And Vomiting    Per pt "I never had a problem with lidocaine, just novocain."  . Procaine Other (See Comments)    Reaction not noted      OBJECTIVE:  Vitals  Today's Vitals   11/13/19 0745  BP: (!) 150/68  Pulse: (!) 58  Temp: 97.9 F (36.6 C)  Weight: 287 lb (130.2 kg)  Height: 5\' 6"  (1.676 m)   Body mass index is 46.32 kg/m.    Physical Exam  General: well developed, well nourished,  very pleasant middle-aged African-American female, seated, in no evident distress Head: head normocephalic and atraumatic.   Neck: supple with no carotid or supraclavicular bruits Cardiovascular: regular rate and rhythm, no murmurs Musculoskeletal: no deformity Skin:  no rash/petichiae; lipoma right forearm Vascular:  Normal pulses all extremities   Neurologic Exam Mental Status: Awake and fully alert.  Normal speech and language. Oriented to place and time. Recent and remote memory intact. Attention span, concentration and fund of knowledge appropriate. Mood and affect appropriate.  Cranial Nerves: Fundoscopic exam reveals sharp disc margins. Pupils equal, briskly reactive to light. Extraocular movements full without nystagmus. Visual fields full to confrontation. Hearing intact. Facial sensation intact. Face, tongue, palate moves normally and symmetrically.  Motor: Normal bulk and tone. Normal strength in all tested extremity muscles. Sensory.: intact to touch , pinprick , position and vibratory sensation.  Coordination: Rapid alternating movements normal in all extremities. Finger-to-nose and heel-to-shin performed accurately bilaterally. Gait and Station: Arises from chair without difficulty. Stance is normal. Gait demonstrates normal stride length and balance Reflexes: 1+ and symmetric. Toes downgoing.      ASSESSMENT:   Catherine Bautista is a 69 y.o. African American female with PMH of diabetes mellitus,  hypertension, hyperlipidemia, fibromyalgia, previous cryptogenic strokes in left MCA in 2002, right frontal in 2011, left frontal cortical infarct 2017 and most recently 12/2018 with left frontal MCA branch infarct.  During 2017 stroke admission, TEE did show positive PFO.  Loop recorder placed at that time.  Hypercoagulable panel on 04/04/17 shows increased Factor VIII at 245, as well as slightly elevated factor VII antigen.  Repeat levels 08/14/2019 showed continued elevated factor VIII therefore referred to hematology with extensive work-up and no further recommendations.  Multiple stroke risk factors, including Graves' disease, DM, HTN, HLD, OSA on CPAP, anemia and obesity.  ILR at [facility] in 07/2019 which had not shown atrial fibrillation during monitoring timeframe.  Subjective residual occasional R>L hand weakness and numbness/tingling with likely contributing underlying carpal tunnel syndrome.    PLAN:  1. Multiple strokes:  -Continue aspirin 81 mg daily  and atorvastatin for secondary stroke prevention.  -Maintain strict control of hypertension with blood pressure goal below 130/90, diabetes with hemoglobin A1c goal below 6.5% and cholesterol with LDL cholesterol (bad cholesterol) goal below 70 mg/dL.  I also advised the patient to eat a healthy diet with plenty of whole grains, cereals, fruits and vegetables, exercise regularly with at least 30 minutes of continuous activity daily and maintain ideal body weight. 2. HTN: Ongoing monitoring and management by PCP 3. HLD: Ongoing monitoring and management as well as prescribing of atorvastatin by PCP 4. DMII: Ongoing monitoring management by PCP 5. Elevated factor VIII: Evaluated by hematology and likely not clinically significant -no further  evaluation or routine monitoring indicated at this time 6. Graves' disease: Continue to follow with endocrinology.  Discontinued thyroid medication  with stable TSH and plans on repeat thyroid levels in June with  PCP 7. Suspect bilateral carpal tunnel syndrome: Stable with ongoing use of braces.  No  indication for further evaluation or work-up at this time.    Overall stable from stroke standpoint and recommend follow-up as needed   I spent 35 minutes of face-to-face and non-face-to-face time with patient.  This included previsit chart review, lab review, study review, order entry, electronic health record documentation, patient education regarding recent stroke, residual deficits, importance of managing stroke risk factors and answered all questions to patient satisfaction     Ihor Austin, [facility]  [facility] [address] [place], [place] [address]  Phone [phone] Fax [phone] Note: This document was prepared with digital dictation and possible smart phrase technology. Any transcriptional errors that result from this process are unintentional.

## 2019-11-13 ENCOUNTER — Ambulatory Visit: Payer: Medicare PPO | Admitting: Adult Health

## 2019-11-13 ENCOUNTER — Other Ambulatory Visit: Payer: Self-pay

## 2019-11-13 ENCOUNTER — Encounter: Payer: Self-pay | Admitting: Adult Health

## 2019-11-13 VITALS — BP 150/68 | HR 58 | Temp 97.9°F | Ht 66.0 in | Wt 287.0 lb

## 2019-11-13 DIAGNOSIS — E0500 Thyrotoxicosis with diffuse goiter without thyrotoxic crisis or storm: Secondary | ICD-10-CM

## 2019-11-13 DIAGNOSIS — I10 Essential (primary) hypertension: Secondary | ICD-10-CM | POA: Diagnosis not present

## 2019-11-13 DIAGNOSIS — Z794 Long term (current) use of insulin: Secondary | ICD-10-CM

## 2019-11-13 DIAGNOSIS — E1165 Type 2 diabetes mellitus with hyperglycemia: Secondary | ICD-10-CM | POA: Diagnosis not present

## 2019-11-13 DIAGNOSIS — G5603 Carpal tunnel syndrome, bilateral upper limbs: Secondary | ICD-10-CM

## 2019-11-13 DIAGNOSIS — E785 Hyperlipidemia, unspecified: Secondary | ICD-10-CM | POA: Diagnosis not present

## 2019-11-13 DIAGNOSIS — Z8673 Personal history of transient ischemic attack (TIA), and cerebral infarction without residual deficits: Secondary | ICD-10-CM

## 2019-11-13 NOTE — Progress Notes (Signed)
I agree with the above plan 

## 2019-11-13 NOTE — Patient Instructions (Signed)
Continue aspirin 81 mg daily  and lipitor  for secondary stroke prevention  Continue to follow up with PCP regarding cholesterol, blood pressure and diabetes management   Continue to follow with endocrinology for Graves' disease  Maintain strict control of hypertension with blood pressure goal below 130/90, diabetes with hemoglobin A1c goal below 6.5% and cholesterol with LDL cholesterol (bad cholesterol) goal below 70 mg/dL. I also advised the patient to eat a healthy diet with plenty of whole grains, cereals, fruits and vegetables, exercise regularly and maintain ideal body weight.  As you have been stable from a stroke standpoint and follow-up with Dr. Nehemiah Settle regularly, recommend follow-up as needed at this time but please do not hesitate to call office with any stroke related questions or concerns       Thank you for coming to see [place] at [facility]. I hope we have been able to provide you high quality care today.  You may receive a patient satisfaction survey over the next few weeks. We would appreciate your feedback and comments so that we may continue to improve ourselves and the health of our patients.

## 2020-01-07 DIAGNOSIS — Z1389 Encounter for screening for other disorder: Secondary | ICD-10-CM | POA: Diagnosis not present

## 2020-01-07 DIAGNOSIS — E1169 Type 2 diabetes mellitus with other specified complication: Secondary | ICD-10-CM | POA: Diagnosis not present

## 2020-01-07 DIAGNOSIS — R519 Headache, unspecified: Secondary | ICD-10-CM | POA: Diagnosis not present

## 2020-01-07 DIAGNOSIS — Z0000 Encounter for general adult medical examination without abnormal findings: Secondary | ICD-10-CM | POA: Diagnosis not present

## 2020-01-07 DIAGNOSIS — E0500 Thyrotoxicosis with diffuse goiter without thyrotoxic crisis or storm: Secondary | ICD-10-CM | POA: Diagnosis not present

## 2020-01-07 DIAGNOSIS — Z8673 Personal history of transient ischemic attack (TIA), and cerebral infarction without residual deficits: Secondary | ICD-10-CM | POA: Diagnosis not present

## 2020-01-07 DIAGNOSIS — E7800 Pure hypercholesterolemia, unspecified: Secondary | ICD-10-CM | POA: Diagnosis not present

## 2020-01-07 DIAGNOSIS — Z794 Long term (current) use of insulin: Secondary | ICD-10-CM | POA: Diagnosis not present

## 2020-01-07 DIAGNOSIS — G4730 Sleep apnea, unspecified: Secondary | ICD-10-CM | POA: Diagnosis not present

## 2020-01-07 DIAGNOSIS — I10 Essential (primary) hypertension: Secondary | ICD-10-CM | POA: Diagnosis not present

## 2020-01-07 DIAGNOSIS — D509 Iron deficiency anemia, unspecified: Secondary | ICD-10-CM | POA: Diagnosis not present

## 2020-01-16 DIAGNOSIS — Z6841 Body Mass Index (BMI) 40.0 and over, adult: Secondary | ICD-10-CM | POA: Diagnosis not present

## 2020-01-16 DIAGNOSIS — Z01419 Encounter for gynecological examination (general) (routine) without abnormal findings: Secondary | ICD-10-CM | POA: Diagnosis not present

## 2020-02-14 DIAGNOSIS — G4733 Obstructive sleep apnea (adult) (pediatric): Secondary | ICD-10-CM | POA: Diagnosis not present

## 2020-02-19 DIAGNOSIS — R350 Frequency of micturition: Secondary | ICD-10-CM | POA: Diagnosis not present

## 2020-02-19 DIAGNOSIS — R5383 Other fatigue: Secondary | ICD-10-CM | POA: Diagnosis not present

## 2020-02-20 ENCOUNTER — Emergency Department (HOSPITAL_COMMUNITY): Payer: Medicare PPO

## 2020-02-20 ENCOUNTER — Inpatient Hospital Stay (HOSPITAL_COMMUNITY)
Admission: EM | Admit: 2020-02-20 | Discharge: 2020-02-22 | DRG: 065 | Disposition: A | Discharge status: Home / Self Care | Payer: Medicare PPO | Attending: Internal Medicine | Admitting: Internal Medicine

## 2020-02-20 DIAGNOSIS — I6389 Other cerebral infarction: Secondary | ICD-10-CM | POA: Diagnosis not present

## 2020-02-20 DIAGNOSIS — Z823 Family history of stroke: Secondary | ICD-10-CM | POA: Diagnosis not present

## 2020-02-20 DIAGNOSIS — I639 Cerebral infarction, unspecified: Secondary | ICD-10-CM

## 2020-02-20 DIAGNOSIS — Q211 Atrial septal defect: Secondary | ICD-10-CM | POA: Diagnosis not present

## 2020-02-20 DIAGNOSIS — Z8673 Personal history of transient ischemic attack (TIA), and cerebral infarction without residual deficits: Secondary | ICD-10-CM

## 2020-02-20 DIAGNOSIS — R4701 Aphasia: Secondary | ICD-10-CM | POA: Diagnosis not present

## 2020-02-20 DIAGNOSIS — G4733 Obstructive sleep apnea (adult) (pediatric): Secondary | ICD-10-CM | POA: Diagnosis present

## 2020-02-20 DIAGNOSIS — R29700 NIHSS score 0: Secondary | ICD-10-CM | POA: Diagnosis present

## 2020-02-20 DIAGNOSIS — R4781 Slurred speech: Secondary | ICD-10-CM | POA: Diagnosis not present

## 2020-02-20 DIAGNOSIS — Z20822 Contact with and (suspected) exposure to covid-19: Secondary | ICD-10-CM | POA: Diagnosis present

## 2020-02-20 DIAGNOSIS — R29818 Other symptoms and signs involving the nervous system: Secondary | ICD-10-CM | POA: Diagnosis not present

## 2020-02-20 DIAGNOSIS — E1159 Type 2 diabetes mellitus with other circulatory complications: Secondary | ICD-10-CM | POA: Diagnosis not present

## 2020-02-20 DIAGNOSIS — E538 Deficiency of other specified B group vitamins: Secondary | ICD-10-CM | POA: Diagnosis present

## 2020-02-20 DIAGNOSIS — E669 Obesity, unspecified: Secondary | ICD-10-CM | POA: Diagnosis present

## 2020-02-20 DIAGNOSIS — Z6841 Body Mass Index (BMI) 40.0 and over, adult: Secondary | ICD-10-CM | POA: Diagnosis not present

## 2020-02-20 DIAGNOSIS — Z8249 Family history of ischemic heart disease and other diseases of the circulatory system: Secondary | ICD-10-CM | POA: Diagnosis not present

## 2020-02-20 DIAGNOSIS — I6340 Cerebral infarction due to embolism of unspecified cerebral artery: Principal | ICD-10-CM | POA: Diagnosis present

## 2020-02-20 DIAGNOSIS — I6523 Occlusion and stenosis of bilateral carotid arteries: Secondary | ICD-10-CM | POA: Diagnosis present

## 2020-02-20 DIAGNOSIS — N179 Acute kidney failure, unspecified: Secondary | ICD-10-CM | POA: Diagnosis not present

## 2020-02-20 DIAGNOSIS — E785 Hyperlipidemia, unspecified: Secondary | ICD-10-CM | POA: Diagnosis present

## 2020-02-20 DIAGNOSIS — E0500 Thyrotoxicosis with diffuse goiter without thyrotoxic crisis or storm: Secondary | ICD-10-CM | POA: Diagnosis not present

## 2020-02-20 DIAGNOSIS — G4730 Sleep apnea, unspecified: Secondary | ICD-10-CM | POA: Diagnosis not present

## 2020-02-20 DIAGNOSIS — R471 Dysarthria and anarthria: Secondary | ICD-10-CM | POA: Diagnosis present

## 2020-02-20 DIAGNOSIS — E1169 Type 2 diabetes mellitus with other specified complication: Secondary | ICD-10-CM | POA: Diagnosis present

## 2020-02-20 DIAGNOSIS — I959 Hypotension, unspecified: Secondary | ICD-10-CM | POA: Diagnosis not present

## 2020-02-20 DIAGNOSIS — R4702 Dysphasia: Secondary | ICD-10-CM | POA: Diagnosis not present

## 2020-02-20 DIAGNOSIS — I152 Hypertension secondary to endocrine disorders: Secondary | ICD-10-CM | POA: Diagnosis present

## 2020-02-20 DIAGNOSIS — Z79899 Other long term (current) drug therapy: Secondary | ICD-10-CM

## 2020-02-20 DIAGNOSIS — R29810 Facial weakness: Secondary | ICD-10-CM | POA: Diagnosis not present

## 2020-02-20 DIAGNOSIS — I10 Essential (primary) hypertension: Secondary | ICD-10-CM | POA: Diagnosis not present

## 2020-02-20 DIAGNOSIS — Z794 Long term (current) use of insulin: Secondary | ICD-10-CM | POA: Diagnosis not present

## 2020-02-20 DIAGNOSIS — E1165 Type 2 diabetes mellitus with hyperglycemia: Secondary | ICD-10-CM

## 2020-02-20 LAB — CBC
HCT: 37.4 % (ref 36.0–46.0)
Hemoglobin: 11.6 g/dL — ABNORMAL LOW (ref 12.0–15.0)
MCH: 28 pg (ref 26.0–34.0)
MCHC: 31 g/dL (ref 30.0–36.0)
MCV: 90.3 fL (ref 80.0–100.0)
Platelets: 303 10*3/uL (ref 150–400)
RBC: 4.14 MIL/uL (ref 3.87–5.11)
RDW: 13.9 % (ref 11.5–15.5)
WBC: 6.7 10*3/uL (ref 4.0–10.5)
nRBC: 0 % (ref 0.0–0.2)

## 2020-02-20 LAB — RAPID URINE DRUG SCREEN, HOSP PERFORMED
Amphetamines: NOT DETECTED
Barbiturates: NOT DETECTED
Benzodiazepines: NOT DETECTED
Cocaine: NOT DETECTED
Opiates: NOT DETECTED
Tetrahydrocannabinol: NOT DETECTED

## 2020-02-20 LAB — ETHANOL: Alcohol, Ethyl (B): <10 mg/dL (ref <10)

## 2020-02-20 LAB — PROTIME-INR
INR: 1 (ref 0.8–1.2)
Prothrombin Time: 13 seconds (ref 11.4–15.2)

## 2020-02-20 LAB — DIFFERENTIAL
Abs Immature Granulocytes: 0.02 10*3/uL (ref 0.00–0.07)
Basophils Absolute: 0.1 10*3/uL (ref 0.0–0.1)
Basophils Relative: 1 %
Eosinophils Absolute: 0.1 10*3/uL (ref 0.0–0.5)
Eosinophils Relative: 2 %
Immature Granulocytes: 0 %
Lymphocytes Relative: 33 %
Lymphs Abs: 2.2 10*3/uL (ref 0.7–4.0)
Monocytes Absolute: 0.5 10*3/uL (ref 0.1–1.0)
Monocytes Relative: 7 %
Neutro Abs: 3.8 10*3/uL (ref 1.7–7.7)
Neutrophils Relative %: 57 %

## 2020-02-20 LAB — URINALYSIS, ROUTINE W REFLEX MICROSCOPIC
Bilirubin Urine: NEGATIVE
Glucose, UA: >=500 mg/dL — AB
Hgb urine dipstick: NEGATIVE
Ketones, ur: NEGATIVE mg/dL
Leukocytes,Ua: NEGATIVE
Nitrite: NEGATIVE
Protein, ur: NEGATIVE mg/dL
Specific Gravity, Urine: 1.008 (ref 1.005–1.030)
pH: 6 (ref 5.0–8.0)

## 2020-02-20 LAB — I-STAT CHEM 8, ED
BUN: 32 mg/dL — ABNORMAL HIGH (ref 8–23)
Calcium, Ion: 1.07 mmol/L — ABNORMAL LOW (ref 1.15–1.40)
Chloride: 105 mmol/L (ref 98–111)
Creatinine, Ser: 1.8 mg/dL — ABNORMAL HIGH (ref 0.44–1.00)
Glucose, Bld: 185 mg/dL — ABNORMAL HIGH (ref 70–99)
HCT: 35 % — ABNORMAL LOW (ref 36.0–46.0)
Hemoglobin: 11.9 g/dL — ABNORMAL LOW (ref 12.0–15.0)
Potassium: 4 mmol/L (ref 3.5–5.1)
Sodium: 137 mmol/L (ref 135–145)
TCO2: 24 mmol/L (ref 22–32)

## 2020-02-20 LAB — COMPREHENSIVE METABOLIC PANEL
ALT: 23 U/L (ref 0–44)
AST: 18 U/L (ref 15–41)
Albumin: 3.4 g/dL — ABNORMAL LOW (ref 3.5–5.0)
Alkaline Phosphatase: 95 U/L (ref 38–126)
Anion gap: 10 (ref 5–15)
BUN: 30 mg/dL — ABNORMAL HIGH (ref 8–23)
CO2: 22 mmol/L (ref 22–32)
Calcium: 8.7 mg/dL — ABNORMAL LOW (ref 8.9–10.3)
Chloride: 104 mmol/L (ref 98–111)
Creatinine, Ser: 1.67 mg/dL — ABNORMAL HIGH (ref 0.44–1.00)
GFR calc Af Amer: 36 mL/min — ABNORMAL LOW (ref >60)
GFR calc non Af Amer: 31 mL/min — ABNORMAL LOW (ref >60)
Glucose, Bld: 187 mg/dL — ABNORMAL HIGH (ref 70–99)
Potassium: 4.2 mmol/L (ref 3.5–5.1)
Sodium: 136 mmol/L (ref 135–145)
Total Bilirubin: 0.9 mg/dL (ref 0.3–1.2)
Total Protein: 6.6 g/dL (ref 6.5–8.1)

## 2020-02-20 LAB — CREATININE, URINE, RANDOM: Creatinine, Urine: 53.41 mg/dL

## 2020-02-20 LAB — TSH: TSH: 1.134 u[IU]/mL (ref 0.350–4.500)

## 2020-02-20 LAB — HEMOGLOBIN A1C
Hgb A1c MFr Bld: 9.3 % — ABNORMAL HIGH (ref 4.8–5.6)
Mean Plasma Glucose: 220.21 mg/dL

## 2020-02-20 LAB — SARS CORONAVIRUS 2 BY RT PCR (HOSPITAL ORDER, PERFORMED IN ~~LOC~~ HOSPITAL LAB): SARS Coronavirus 2: NEGATIVE

## 2020-02-20 LAB — HIV ANTIBODY (ROUTINE TESTING W REFLEX): HIV Screen 4th Generation wRfx: NONREACTIVE

## 2020-02-20 LAB — CBG MONITORING, ED
Glucose-Capillary: 178 mg/dL — ABNORMAL HIGH (ref 70–99)
Glucose-Capillary: 120 mg/dL — ABNORMAL HIGH (ref 70–99)
Glucose-Capillary: 265 mg/dL — ABNORMAL HIGH (ref 70–99)

## 2020-02-20 LAB — AMMONIA: Ammonia: 15 umol/L (ref 9–35)

## 2020-02-20 LAB — APTT: aPTT: 27 seconds (ref 24–36)

## 2020-02-20 LAB — SODIUM, URINE, RANDOM: Sodium, Ur: 91 mmol/L

## 2020-02-20 MED ORDER — CLOPIDOGREL BISULFATE 300 MG PO TABS
300.0000 mg | ORAL_TABLET | Freq: Once | ORAL | Status: AC
  Administered 2020-02-20: 300 mg via ORAL
  Filled 2020-02-20: qty 1

## 2020-02-20 MED ORDER — ACETAMINOPHEN 325 MG PO TABS: 650.0000 mg | ORAL_TABLET | ORAL | Status: DC | PRN

## 2020-02-20 MED ORDER — ASPIRIN EC 81 MG PO TBEC
81.0000 mg | DELAYED_RELEASE_TABLET | Freq: Every day | ORAL | Status: DC
  Administered 2020-02-21: 81 mg via ORAL
  Filled 2020-02-20: qty 1

## 2020-02-20 MED ORDER — SODIUM CHLORIDE 0.9 % IV SOLN: INTRAVENOUS | Status: AC

## 2020-02-20 MED ORDER — STROKE: EARLY STAGES OF RECOVERY BOOK
Freq: Once | Status: AC
  Filled 2020-02-20: qty 1

## 2020-02-20 MED ORDER — ACETAMINOPHEN 160 MG/5ML PO SOLN: 650.0000 mg | ORAL | Status: DC | PRN

## 2020-02-20 MED ORDER — INSULIN ASPART 100 UNIT/ML ~~LOC~~ SOLN
0.0000 [IU] | Freq: Three times a day (TID) | SUBCUTANEOUS | Status: DC
  Administered 2020-02-21: 2 [IU] via SUBCUTANEOUS
  Administered 2020-02-21 – 2020-02-22 (×2): 5 [IU] via SUBCUTANEOUS
  Administered 2020-02-22: 2 [IU] via SUBCUTANEOUS

## 2020-02-20 MED ORDER — ATORVASTATIN CALCIUM 40 MG PO TABS
40.0000 mg | ORAL_TABLET | Freq: Every day | ORAL | Status: DC
  Administered 2020-02-20: 40 mg via ORAL
  Filled 2020-02-20: qty 1

## 2020-02-20 MED ORDER — INSULIN DETEMIR 100 UNIT/ML ~~LOC~~ SOLN
54.0000 [IU] | Freq: Every day | SUBCUTANEOUS | Status: DC
  Administered 2020-02-20 – 2020-02-21 (×2): 54 [IU] via SUBCUTANEOUS
  Filled 2020-02-20 (×3): qty 0.54

## 2020-02-20 MED ORDER — SENNOSIDES-DOCUSATE SODIUM 8.6-50 MG PO TABS: 1.0000 | ORAL_TABLET | Freq: Every evening | ORAL | Status: DC | PRN

## 2020-02-20 MED ORDER — ACETAMINOPHEN 650 MG RE SUPP: 650.0000 mg | RECTAL | Status: DC | PRN

## 2020-02-20 MED ORDER — CLOPIDOGREL BISULFATE 75 MG PO TABS
75.0000 mg | ORAL_TABLET | Freq: Every day | ORAL | Status: DC
  Administered 2020-02-21: 75 mg via ORAL
  Filled 2020-02-20: qty 1

## 2020-02-20 MED ORDER — HEPARIN SODIUM (PORCINE) 5000 UNIT/ML IJ SOLN
5000.0000 [IU] | Freq: Three times a day (TID) | INTRAMUSCULAR | Status: DC
  Administered 2020-02-20 – 2020-02-21 (×3): 5000 [IU] via SUBCUTANEOUS
  Filled 2020-02-20 (×3): qty 1

## 2020-02-20 MED ORDER — SODIUM CHLORIDE 0.9 % IV BOLUS
500.0000 mL | Freq: Once | INTRAVENOUS | Status: AC
  Administered 2020-02-20: 500 mL via INTRAVENOUS

## 2020-02-20 MED ORDER — LORAZEPAM 2 MG/ML IJ SOLN
1.0000 mg | Freq: Once | INTRAMUSCULAR | Status: AC
  Administered 2020-02-20: 1 mg via INTRAVENOUS
  Filled 2020-02-20: qty 1

## 2020-02-20 NOTE — H&P (Signed)
History and Physical    Catherine Bautista ZWC:[id number] DOB: 14-May-1951 DOA: 02/20/2020  PCP: Renford Dills, MD  Patient coming from: Home  I have personally briefly reviewed patient's old medical records in [facility]  Chief Complaint: Dysarthria  HPI: Catherine Bautista is a 69 y.o. female with medical history significant for multiple CVAs/cryptogenic strokes, IDT2DM, HTN, HLD, Graves' disease, and OSA on CPAP who presents to the ED for evaluation of dysarthria.  Patient states she was in her usual state of health until around 10 AM this morning (02/20/2020) when she began to have word finding difficulty and slurred speech.  She says she was try to speak to her husband but was having difficulty getting the right words out.  She knew what she wanted to say but was unable to.  She eventually called a relative and was able to explain she was feeling unwell.  Her relative called EMS.  Patient otherwise denies any focal weakness, change in sensation, difficulty with ambulation, chest pain, dyspnea, nausea, vomiting, change in vision, abdominal pain, dysuria.  She has been on aspirin 81 mg daily.  She has a history of multiple prior strokes with significant work-up and no obvious etiology.  She has a known PFO on prior TEE.  She has had a loop recorder in place without evidence of arrhythmia.  ED Course:  Initial vitals showed BP 157/60, pulse 65, RR 12, temp 97.9 Fahrenheit, SPO2 98% on room air.  Labs notable for sodium 136, potassium 4.2, bicarb 22, BUN 30, creatinine 1.67 (previously 1.09 on 08/14/2019), serum glucose 187, LFTs within normal limits, WBC 6.7, hemoglobin 11.6, platelets 303,000.  Serum ethanol is undetectable.  Ammonia 15.  UDS is negative.  Urinalysis without evidence of UTI.  Hemoglobin A1c 9.3.  SARS-CoV-2 PCR is negative.  CT head was negative for acute intracranial abnormality.  Redemonstrated chronic cortical/subcortical left MCA infarct changes noted as well as additional small  chronic cortical infarct within the left parietal and frontal lobes, chronic left thalamic lacunar infarct.  MRI brain and MRA head showed a small acute infarct of the left frontal lobe involving the primary motor cortex.  Corresponding susceptibility reflecting petechial hemorrhage noted.  No proximal intracranial vessel occlusion seen.  Similar bilateral ICA stenoses and stable chronic infarct changes also noted.  Neurology were consulted and recommended medical admission.  Patient was loaded with Plavix 300 mg once to be followed by Plavix 75 mg daily beginning tomorrow.  The hospitalist service was consulted to admit for further evaluation and management.  Review of Systems: All systems reviewed and are negative except as documented in history of present illness above.   Past Medical History:  Diagnosis Date  . Anemia   . Anemia, vitamin B12 deficiency   . Depression with anxiety   . Diabetes mellitus   . Dyslipidemia   . Fibromyalgia   . Graves disease   . Hypertension   . Obesity   . Stroke (HCC)    "I've had 3 strokes"    Past Surgical History:  Procedure Laterality Date  . ABDOMINAL HYSTERECTOMY  2007  . CHOLECYSTECTOMY     laproscopic  . EP IMPLANTABLE DEVICE N/A 01/05/2016   Procedure: Loop Recorder Insertion;  Surgeon: Will Jorja Loa, MD;  Location: [facility] INVASIVE CV LAB;  Service: Cardiovascular;  Laterality: N/A;  . GASTRIC BYPASS  1981  . TEE WITHOUT CARDIOVERSION N/A 01/05/2016   Procedure: TRANSESOPHAGEAL ECHOCARDIOGRAM (TEE);  Surgeon: Quintella Reichert, MD;  Location: [facility] ENDOSCOPY;  Service: Cardiovascular;  Laterality: N/A;  . TEE WITHOUT CARDIOVERSION N/A 04/18/2017   Procedure: TRANSESOPHAGEAL ECHOCARDIOGRAM (TEE);  Surgeon: Lewayne Buntingrenshaw, Brian S, MD;  [facility];  Service: Cardiovascular;  Laterality: N/A;  . therapuetic abortion  1986   associated with C-section    Social History:  reports that she has never smoked. She has never used smokeless  tobacco. She reports that she does not drink alcohol and does not use drugs.  Allergies  Allergen Reactions  . Canagliflozin Other (See Comments)    Decreased renal function, dizziness, and extremely lethargy  . Demerol Nausea And Vomiting  . Excedrin Extra Strength [Aspirin-Acetaminophen-Caffeine] Other (See Comments)    Weird feeling  . Janumet [Sitagliptin-Metformin Hcl] Other (See Comments)    Bloating and gas  . Meperidine Nausea And Vomiting  . Morphine And Related Other (See Comments)    "Weird feelings"  . Novocain [Procaine Hcl] Nausea And Vomiting    Per pt "I never had a problem with lidocaine, just novocain."  . Procaine Other (See Comments)    Reaction not noted    Family History  Problem Relation Age of Onset  . Stroke Mother   . Heart failure Mother   . Lung cancer Father   . Kidney cancer Brother   . Stroke Cousin      Prior to Admission medications   Medication Sig Start Date End Date Taking? Authorizing Provider  atorvastatin (LIPITOR) 40 MG tablet Take 1 tablet (40 mg total) by mouth daily at 6 PM. Patient taking differently: Take 40 mg by mouth at bedtime.  01/06/16  Yes Elgergawy, Leana Roeawood S, MD  b complex vitamins tablet Take 1 tablet by mouth daily. Reported on 01/13/2016   Yes [provider]  Cholecalciferol (VITAMIN D3) 50 MCG (2000 UT) TABS Take 2,000 Units by mouth daily.   Yes [provider]  ferrous sulfate 325 (65 FE) MG tablet Take 325 mg by mouth daily with breakfast.   Yes [provider]  glimepiride (AMARYL) 4 MG tablet Take 4 mg by mouth daily with breakfast.   Yes [provider]  hydrochlorothiazide (HYDRODIURIL) 25 MG tablet Take 1 tablet (25 mg total) by mouth daily. 01/11/19  Yes Joseph ArtVann, Jessica U, DO  Insulin Detemir (LEVEMIR FLEXTOUCH) 100 UNIT/ML Pen Inject 54 Units into the skin at bedtime.    Yes [provider]  Magnesium 300 MG CAPS Take 300 mg by mouth daily.   Yes [provider]    metFORMIN (GLUCOPHAGE-XR) 750 MG 24 hr tablet Take 750 mg by mouth 2 (two) times a day.   Yes [provider]  OMEGA 3 1200 MG CAPS Take 1,200 mg by mouth at bedtime.    Yes [provider]  Prenatal Vit-Fe Fumarate-FA (PRENATAL PO) Take 1 tablet by mouth daily.   Yes [provider]  ramipril (ALTACE) 10 MG capsule Take 10 mg by mouth daily.     Yes [provider]  vitamin B-12 (CYANOCOBALAMIN) 1000 MCG tablet Take 1,000 mcg by mouth daily.    Yes [provider]  vitamin C (ASCORBIC ACID) 500 MG tablet Take 500 mg by mouth daily.   Yes [provider]  ACCU-CHEK SMARTVIEW test strip USE TO CHECK YOUR BLOOD SUGAR THREE TIMES A DAY DX E13.9 IN VITRO 90 DAYS 06/24/19   [provider]  BD PEN NEEDLE NANO 2ND GEN 32G X 4 MM MISC USE TO TEST BLOOD SUGAR 3 TIMES DAILY 07/01/19   [provider]    Physical Exam: Vitals:   02/20/20 1531 02/20/20 1546 02/20/20 1601 02/20/20 1700  BP: (!) 111/63 (!) 128/61 (!) 141/106 (!) 124/61  Pulse: 65  60 69  Resp: Temp:      TempSrc:      SpO2: 97% 100% 96% 98%  Weight:      Height:       Constitutional: Sitting up in bed, NAD, calm, comfortable Eyes: PERRL, EOMI, lids and conjunctivae normal ENMT: Mucous membranes are moist. Posterior pharynx clear of any exudate or lesions.Normal dentition.  Neck: normal, supple, no masses. Respiratory: clear to auscultation bilaterally, no wheezing, no crackles. Normal respiratory effort. No accessory muscle use.  Cardiovascular: Regular rate and rhythm, no murmurs / rubs / gallops. No extremity edema. 2+ pedal pulses. Abdomen: no tenderness, no masses palpated. No hepatosplenomegaly. Bowel sounds positive.  Musculoskeletal: no clubbing / cyanosis. No joint deformity upper and lower extremities. Good ROM, no contractures. Normal muscle tone.  Skin: no rashes, lesions, ulcers. No induration Neurologic: CN 2-12 grossly intact.  Sensation intact, Strength 5/5 in all 4.  Psychiatric: Normal judgment and insight. Alert and oriented x 3. Normal mood.   Labs on Admission: I have personally reviewed following labs and imaging studies  CBC: Recent Labs  Lab 02/20/20 1139 02/20/20 1141  WBC 6.7  --   NEUTROABS 3.8  --   HGB 11.6* 11.9*  HCT 37.4 35.0*  MCV 90.3  --   PLT 303  --    Basic Metabolic Panel: Recent Labs  Lab 02/20/20 1139 02/20/20 1141  NA 136 137  K 4.2 4.0  CL 104 105  CO2 22  --   GLUCOSE 187* 185*  BUN 30* 32*  CREATININE 1.67* 1.80*  CALCIUM 8.7*  --    GFR: Estimated Creatinine Clearance: 41.7 mL/min (A) (by C-G formula based on SCr of 1.8 mg/dL (H)). Liver Function Tests: Recent Labs  Lab 02/20/20 1139  AST 18  ALT 23  ALKPHOS 95  BILITOT 0.9  PROT 6.6  ALBUMIN 3.4*   No results for input(s): LIPASE, AMYLASE in the last 168 hours. Recent Labs  Lab 02/20/20 1240  AMMONIA 15   Coagulation Profile: Recent Labs  Lab 02/20/20 1139  INR 1.0   Cardiac Enzymes: No results for input(s): CKTOTAL, CKMB, CKMBINDEX, TROPONINI in the last 168 hours. BNP (last 3 results) No results for input(s): PROBNP in the last 8760 hours. HbA1C: Recent Labs    02/20/20 1240  HGBA1C 9.3*   CBG: Recent Labs  Lab 02/20/20 1135 02/20/20 1557  GLUCAP 178* 120*   Lipid Profile: No results for input(s): CHOL, HDL, LDLCALC, TRIG, CHOLHDL, LDLDIRECT in the last 72 hours. Thyroid Function Tests: No results for input(s): TSH, T4TOTAL, FREET4, T3FREE, THYROIDAB in the last 72 hours. Anemia Panel: No results for input(s): VITAMINB12, FOLATE, FERRITIN, TIBC, IRON, RETICCTPCT in the last 72 hours. Urine analysis:    Component Value Date/Time   COLORURINE YELLOW 02/20/2020 1710   APPEARANCEUR CLEAR 02/20/2020 1710   LABSPEC 1.008 02/20/2020 1710   PHURINE 6.0 02/20/2020 1710   GLUCOSEU >=500 (A) 02/20/2020 1710   HGBUR NEGATIVE 02/20/2020 1710   BILIRUBINUR NEGATIVE 02/20/2020 1710    KETONESUR NEGATIVE 02/20/2020 1710   PROTEINUR NEGATIVE 02/20/2020 1710   NITRITE NEGATIVE 02/20/2020 1710   LEUKOCYTESUR NEGATIVE 02/20/2020 1710    Radiological Exams on Admission: MR ANGIO HEAD WO CONTRAST  Result Date: 02/20/2020 CLINICAL DATA:  Code stroke follow-up EXAM:  MRI HEAD WITHOUT CONTRAST MRA HEAD WITHOUT CONTRAST TECHNIQUE: Multiplanar, multiecho pulse sequences of the brain and surrounding structures were obtained without intravenous contrast. Angiographic images of the head were obtained using MRA technique without contrast. COMPARISON:  08/21/2019 MRI head, 01/08/2019 MRA head FINDINGS: MRI HEAD Brain: There is small area of cortical/subcortical reduced diffusion along the left precentral gyrus at the lateral aspect of the hand motor region. There is some corresponding susceptibility likely reflecting petechial hemorrhage. Chronic left MCA territory infarction centered in the left temporal lobe. Chronic left frontoparietal cortical infarcts. Chronic left thalamic infarct. Minimal additional foci of T2 hyperintensity in the supratentorial white matter likely reflect minor chronic microvascular ischemic changes. There is no intracranial mass or mass effect. There is no hydrocephalus or extra-axial fluid collection. Ventricles are stable in size. Vascular: Major vessel flow voids at the skull base are preserved. Skull and upper cervical spine: Normal marrow signal is preserved. Sinuses/Orbits: Paranasal sinuses are aerated. Orbits are unremarkable. Other: Sella is unremarkable.  Mastoid air cells are clear. MRA HEAD Intracranial internal carotid arteries are patent with atherosclerotic irregularity and moderate stenoses along cavernous and paraclinoid portions. Appearance is similar to the prior study. Middle and anterior cerebral arteries are patent. Intracranial vertebral arteries, basilar artery, posterior cerebral arteries are patent. There is no aneurysm. IMPRESSION: Small acute infarct  of the left frontal lobe involving the primary motor cortex. Corresponding susceptibility reflecting petechial hemorrhage. No proximal intracranial vessel occlusion. Similar bilateral ICA stenoses. Stable additional chronic findings detailed above. Electronically Signed   By: Guadlupe Spanish M.D.   On: 02/20/2020 18:45   MR BRAIN WO CONTRAST  Result Date: 02/20/2020 CLINICAL DATA:  Code stroke follow-up EXAM: MRI HEAD WITHOUT CONTRAST MRA HEAD WITHOUT CONTRAST TECHNIQUE: Multiplanar, multiecho pulse sequences of the brain and surrounding structures were obtained without intravenous contrast. Angiographic images of the head were obtained using MRA technique without contrast. COMPARISON:  08/21/2019 MRI head, 01/08/2019 MRA head FINDINGS: MRI HEAD Brain: There is small area of cortical/subcortical reduced diffusion along the left precentral gyrus at the lateral aspect of the hand motor region. There is some corresponding susceptibility likely reflecting petechial hemorrhage. Chronic left MCA territory infarction centered in the left temporal lobe. Chronic left frontoparietal cortical infarcts. Chronic left thalamic infarct. Minimal additional foci of T2 hyperintensity in the supratentorial white matter likely reflect minor chronic microvascular ischemic changes. There is no intracranial mass or mass effect. There is no hydrocephalus or extra-axial fluid collection. Ventricles are stable in size. Vascular: Major vessel flow voids at the skull base are preserved. Skull and upper cervical spine: Normal marrow signal is preserved. Sinuses/Orbits: Paranasal sinuses are aerated. Orbits are unremarkable. Other: Sella is unremarkable.  Mastoid air cells are clear. MRA HEAD Intracranial internal carotid arteries are patent with atherosclerotic irregularity and moderate stenoses along cavernous and paraclinoid portions. Appearance is similar to the prior study. Middle and anterior cerebral arteries are patent. Intracranial  vertebral arteries, basilar artery, posterior cerebral arteries are patent. There is no aneurysm. IMPRESSION: Small acute infarct of the left frontal lobe involving the primary motor cortex. Corresponding susceptibility reflecting petechial hemorrhage. No proximal intracranial vessel occlusion. Similar bilateral ICA stenoses. Stable additional chronic findings detailed above. Electronically Signed   By: Guadlupe Spanish M.D.   On: 02/20/2020 18:45   CT HEAD CODE STROKE WO CONTRAST  Result Date: 02/20/2020 CLINICAL DATA:  Code stroke.  Prior CVA, slurred speech and aphasia. EXAM: CT HEAD WITHOUT CONTRAST TECHNIQUE: Contiguous axial images were obtained from the  base of the skull through the vertex without intravenous contrast. COMPARISON:  Brain MRI 08/21/2019. FINDINGS: Brain: Stable, mild generalized parenchymal atrophy. Redemonstrated remote left MCA territory cortical/subcortical infarct affecting portions of the left temporal, parietal and occipital lobes as well as posterior left insula. Additional known small chronic cortically based infarcts within the left parietal and frontal lobes as previously demonstrated on the MRI of 08/21/2019. Redemonstrated chronic left thalamic lacunar infarct. There is no evidence of acute intracranial hemorrhage. No acute demarcated cortical infarction is identified. No extra-axial fluid collection. No evidence of intracranial mass. No midline shift. Vascular: No hyperdense vessel.  Atherosclerotic calcifications. Skull: Normal. Negative for fracture or focal lesion. Sinuses/Orbits: Visualized orbits show no acute finding. No significant paranasal sinus disease or mastoid effusion at the imaged levels. ASPECTS [facility] Stroke Program Early CT Score) - Ganglionic level infarction (caudate, lentiform nuclei, internal capsule, insula, M1-M3 cortex): 7 - Supraganglionic infarction (M4-M6 cortex): 3 Total score (0-10 with 10 being normal): 10 (when discounting chronic infarction  changes). These results were communicated to Dr. Laurence Slate At 11:56 amon 7/22/2021by text page via the [facility] messaging system. IMPRESSION: No CT evidence of acute intracranial abnormality. ASPECTS is 10. Redemonstrated chronic cortical/subcortical left MCA infarct affecting portions of the left temporal, parietal and occipital lobes as well as posterior left insula. Additional small chronic cortical infarcts within the left parietal and frontal lobes, unchanged. Redemonstrated chronic left thalamic lacunar infarct. Stable, mild generalized parenchymal atrophy. Electronically Signed   By: Jackey Loge DO   On: 02/20/2020 11:57    EKG: Independently reviewed. Normal sinus rhythm, nonspecific IVCD, first-degree AV block.  Not significantly changed when compared to prior.  Assessment/Plan Principal Problem:   Acute cerebrovascular accident (CVA) (HCC) Active Problems:   Hyperlipidemia associated with type 2 diabetes mellitus (HCC)   Hypertension associated with diabetes (HCC)   Type 2 diabetes mellitus with hyperglycemia, with long-term current use of insulin (HCC)   Sleep apnea   AKI (acute kidney injury) (HCC)   Graves disease  MILINDA SWEENEY is a 69 y.o. female with medical history significant for multiple CVAs/cryptogenic strokes, IDT2DM, HTN, HLD, Graves' disease, and OSA on CPAP who is admitted with acute left frontal lobe infarct.  Acute infarct of left frontal lobe/history of cryptogenic stroke: History of multiple prior CVAs/cryptogenic stroke.  Has known PFO without previous evidence of arrhythmia or clot.  Presented with transient dysarthria/expressive aphasia which has since resolved. -MRI brain/MRA head with acute left frontal lobe infarct -Obtain carotid Dopplers -Obtain echocardiogram -Check lower extremity Dopplers -Continue aspirin 81 mg daily, loaded with Plavix 300 mg once followed by 75 mg daily per neurology -Continue atorvastatin -Appreciate further neurology assistance -PT/OT/SLP  eval -Check A1c, lipid panel -Allow permissive hypertension for now  Acute kidney injury: Possibly medication related.  Continue IV fluid hydration overnight and repeat labs in the morning.  Hold home Metformin, ramipril, HCTZ.  Hypertension: Hold home ramipril and HCTZ to allow for permissive hypertension.  Hyperlipidemia: Continue atorvastatin.  Insulin-dependent type 2 diabetes: A1c 9.3%.  Continue home Levemir 54 units nightly, add sliding scale insulin in hospital.  Home with home Metformin and Amaryl.  Graves' disease: Previously on methimazole which has been titrated off per her PCP/endocrinology due to entering remission.  Currently only monitoring TSH on an outpatient basis.  OSA: Continue CPAP nightly.  DVT prophylaxis: Subcutaneous heparin Code Status: Full code, confirmed with patient Family Communication: Discussed with patient's son at bedside Disposition Plan: From home, likely discharge to home pending  further stroke work-up Consults called: Neurology Admission status:  Status is: Inpatient  Remains inpatient appropriate because:Ongoing diagnostic testing needed not appropriate for outpatient work up   Dispo: The patient is from: Home              Anticipated d/c is to: Home              Anticipated d/c date is: 2 days Pending further stroke work-up.                Patient currently is not medically stable to d/c.   Darreld Mclean MD [facility]  If 7PM-7AM, please contact night-coverage [email or web address]  02/20/2020, 9:19 PM

## 2020-02-20 NOTE — ED Provider Notes (Signed)
  Face-to-face evaluation   History: She presents for evaluation of intermittent difficulty talking.  She denies headache, extremity pain or weakness.  After onset of symptoms she was able to take a shower before coming here.  Physical exam: Obese, alert and cooperative.  She is lucid.  No dysarthria or aphasia.  Medical screening examination/treatment/procedure(s) were conducted as a shared visit with non-physician practitioner(s) and myself.  I personally evaluated the patient during the encounter    Mancel Bale, MD 02/21/20 2103

## 2020-02-20 NOTE — Code Documentation (Addendum)
Patient from home and on the phone at 1000 (LKW) when she realized she couldn't find her words to answer a question. She hung up from caller and called her friend to tell her to call 911. [facility] arrived and called the code stroke. Pt with aphasia, slurred speech (resolved), and a headache. Pt does have a hx of migraines, and several strokes in the past. On arrival pt's NIHSS is a 0. Pt stutters her words intermittently but is able to respond appropriately and does have a headache. Pt met by stroke team, MD and RN at [facility]. She is taken to CT. CT negative for bleed but does show an old stroke per MD. Pt is "too good to treat" at this time. TPA not given for that reason. Care Plan: Q31mn VS and Q322m neuro checks/mNIHSS until outside the window at 1430. Hand off with ClCarlis AbbottRN. Obbie Lewallen, SaRande BruntRN, SCRN

## 2020-02-20 NOTE — ED Notes (Signed)
Pt ambulated to BR. Tolerated well. Hat was placed in toilet to collect urine. Pt was assisted back to room. When this EMT went to collect urine, it had already been dumped out by other staff member.

## 2020-02-20 NOTE — ED Triage Notes (Signed)
Patient brought in by [facility] from home for stroke symptoms, sudden onset of slurred speech at 1000. Speech clear but delayed on arrival to ED. Patient alert, oriented, and in no apparent distress at this time.

## 2020-02-20 NOTE — ED Provider Notes (Addendum)
[facility] EMERGENCY DEPARTMENT Provider Note   CSN: [id number] Arrival date & time: 02/20/20  1133  An emergency department physician performed an initial assessment on this suspected stroke patient at 1135.  History No chief complaint on file.   Catherine Bautista is a 69 y.o. female who has a hx of previous CVA (embolic/cryptogenic) anemia, elevated factor 8, Obesity, DM, HTN, HLD. She was on the phone today with a friend when she had onset of headache, speech difficulty and slurred speech. Daughter called 911. Per ems, slurred speech resolved, however she continued to have slowed speech and stuttering. Sxs are improved since then . She was activated as a code stroke. Onset of sxs began at 10:15 am.   HPI     Past Medical History:  Diagnosis Date  . Anemia   . Anemia, vitamin B12 deficiency   . Depression with anxiety   . Diabetes mellitus   . Dyslipidemia   . Fibromyalgia   . Graves disease   . Hypertension   . Obesity   . Stroke (HCC)    "I've had 3 strokes"    Patient Active Problem List   Diagnosis Date Noted  . Acute cerebrovascular accident (CVA) (HCC) 02/20/2020  . Graves disease   . Right hand weakness   . AKI (acute kidney injury) (HCC) 01/07/2019  . Hyperthyroidism 06/08/2017  . History of stroke 03/23/2016  . Type 2 diabetes mellitus with hyperglycemia, with long-term current use of insulin (HCC)   . Essential hypertension   . Cerebrovascular accident (CVA) due to embolism of left middle cerebral artery (HCC)   . Acute CVA (cerebrovascular accident) (HCC) 01/03/2016  . Hyperlipidemia associated with type 2 diabetes mellitus (HCC)   . Hypertension associated with diabetes (HCC)   . Stroke (HCC)   . Anemia   . Depression with anxiety   . Right arm weakness   . Sleep apnea 01/31/2012  . Anemia, unspecified 05/21/2011    Past Surgical History:  Procedure Laterality Date  . ABDOMINAL HYSTERECTOMY  2007  . CHOLECYSTECTOMY     laproscopic    . EP IMPLANTABLE DEVICE N/A 01/05/2016   Procedure: Loop Recorder Insertion;  Surgeon: Will Jorja Loa, MD;  Location: [facility] INVASIVE CV LAB;  Service: Cardiovascular;  Laterality: N/A;  . GASTRIC BYPASS  1981  . TEE WITHOUT CARDIOVERSION N/A 01/05/2016   Procedure: TRANSESOPHAGEAL ECHOCARDIOGRAM (TEE);  Surgeon: Quintella Reichert, MD;  Location: [facility] ENDOSCOPY;  Service: Cardiovascular;  Laterality: N/A;  . TEE WITHOUT CARDIOVERSION N/A 04/18/2017   Procedure: TRANSESOPHAGEAL ECHOCARDIOGRAM (TEE);  Surgeon: Lewayne Bunting, MD;  Location: [facility] ENDOSCOPY;  Service: Cardiovascular;  Laterality: N/A;  . therapuetic abortion  1986   associated with C-section     OB History   No obstetric history on file.     Family History  Problem Relation Age of Onset  . Stroke Mother   . Heart failure Mother   . Lung cancer Father   . Kidney cancer Brother   . Stroke Cousin     Social History   Tobacco Use  . Smoking status: Never Smoker  . Smokeless tobacco: Never Used  Vaping Use  . Vaping Use: Never used  Substance Use Topics  . Alcohol use: No  . Drug use: No    Home Medications Prior to Admission medications   Medication Sig Start Date End Date Taking? Authorizing Provider  atorvastatin (LIPITOR) 40 MG tablet Take 1 tablet (40 mg total) by mouth daily at  6 PM. Patient taking differently: Take 40 mg by mouth at bedtime.  01/06/16  Yes Elgergawy, Leana Roe, MD  b complex vitamins tablet Take 1 tablet by mouth daily. Reported on 01/13/2016   Yes [provider]  Cholecalciferol (VITAMIN D3) 50 MCG (2000 UT) TABS Take 2,000 Units by mouth daily.   Yes [provider]  ferrous sulfate 325 (65 FE) MG tablet Take 325 mg by mouth daily with breakfast.   Yes [provider]  glimepiride (AMARYL) 4 MG tablet Take 4 mg by mouth daily with breakfast.   Yes [provider]  hydrochlorothiazide (HYDRODIURIL) 25 MG tablet Take 1 tablet (25 mg total) by mouth daily. 01/11/19   Yes Joseph Art, DO  Insulin Detemir (LEVEMIR FLEXTOUCH) 100 UNIT/ML Pen Inject 54 Units into the skin at bedtime.    Yes [provider]  Magnesium 300 MG CAPS Take 300 mg by mouth daily.   Yes [provider]  metFORMIN (GLUCOPHAGE-XR) 750 MG 24 hr tablet Take 750 mg by mouth 2 (two) times a day.   Yes [provider]  OMEGA 3 1200 MG CAPS Take 1,200 mg by mouth at bedtime.    Yes [provider]  Prenatal Vit-Fe Fumarate-FA (PRENATAL PO) Take 1 tablet by mouth daily.   Yes [provider]  ramipril (ALTACE) 10 MG capsule Take 10 mg by mouth daily.     Yes [provider]  vitamin B-12 (CYANOCOBALAMIN) 1000 MCG tablet Take 1,000 mcg by mouth daily.    Yes [provider]  vitamin C (ASCORBIC ACID) 500 MG tablet Take 500 mg by mouth daily.   Yes [provider]  ACCU-CHEK SMARTVIEW test strip USE TO CHECK YOUR BLOOD SUGAR THREE TIMES A DAY DX E13.9 IN VITRO 90 DAYS 06/24/19   [provider]  BD PEN NEEDLE NANO 2ND GEN 32G X 4 MM MISC USE TO TEST BLOOD SUGAR 3 TIMES DAILY 07/01/19   [provider]    Allergies    Canagliflozin, Demerol, Excedrin extra strength [aspirin-acetaminophen-caffeine], Janumet [sitagliptin-metformin hcl], Meperidine, Morphine and related, Novocain [procaine hcl], and Procaine  Review of Systems   Review of Systems Ten systems reviewed and are negative for acute change, except as noted in the HPI.   Physical Exam Updated Vital Signs BP (!) 160/63   Pulse 101   Temp 97.9 F (36.6 C) (Oral)   Resp (!) 25   Ht  (1.676 m)   Wt (!) 129 kg   SpO2 94%   BMI 45.90 kg/m   Physical Exam Vitals and nursing note reviewed.  Constitutional:      General: She is not in acute distress.    Appearance: She is well-developed. She is not diaphoretic.  HENT:     Head: Normocephalic and atraumatic.  Eyes:     General: No scleral icterus.    Conjunctiva/sclera: Conjunctivae  normal.  Cardiovascular:     Rate and Rhythm: Normal rate and regular rhythm.     Heart sounds: Normal heart sounds. No murmur heard.  No friction rub. No gallop.   Pulmonary:     Effort: Pulmonary effort is normal. No respiratory distress.     Breath sounds: Normal breath sounds.  Abdominal:     General: Bowel sounds are normal. There is no distension.     Palpations: Abdomen is soft. There is no mass.     Tenderness: There is no abdominal tenderness. There is no guarding.  Musculoskeletal:  Cervical back: Normal range of motion.  Skin:    General: Skin is warm and dry.  Neurological:     Mental Status: She is alert and oriented to person, place, and time.     Comments: Speech is clear and goal oriented, follows commands Major Cranial nerves without deficit, no facial droop Normal strength in upper and lower extremities bilaterally including dorsiflexion and plantar flexion, strong and equal grip strength Sensation normal to light and sharp touch Moves extremities without ataxia, coordination intact Normal finger to nose and rapid alternating movements Neg romberg, no pronator drift Normal gait Normal heel-shin and balance  Psychiatric:        Behavior: Behavior normal.     ED Results / Procedures / Treatments   Labs (all labs ordered are listed, but only abnormal results are displayed) Labs Reviewed  CBC - Abnormal; Notable for the following components:      Result Value   Hemoglobin 11.6 (*)    All other components within normal limits  COMPREHENSIVE METABOLIC PANEL - Abnormal; Notable for the following components:   Glucose, Bld 187 (*)    BUN 30 (*)    Creatinine, Ser 1.67 (*)    Calcium 8.7 (*)    Albumin 3.4 (*)    GFR calc non Af Amer 31 (*)    GFR calc Af Amer 36 (*)    All other components within normal limits  URINALYSIS, ROUTINE W REFLEX MICROSCOPIC - Abnormal; Notable for the following components:   Glucose, UA >=500 (*)    Bacteria, UA RARE (*)     All other components within normal limits  HEMOGLOBIN A1C - Abnormal; Notable for the following components:   Hgb A1c MFr Bld 9.3 (*)    All other components within normal limits  I-STAT CHEM 8, ED - Abnormal; Notable for the following components:   BUN 32 (*)    Creatinine, Ser 1.80 (*)    Glucose, Bld 185 (*)    Calcium, Ion 1.07 (*)    Hemoglobin 11.9 (*)    HCT 35.0 (*)    All other components within normal limits  CBG MONITORING, ED - Abnormal; Notable for the following components:   Glucose-Capillary 178 (*)    All other components within normal limits  CBG MONITORING, ED - Abnormal; Notable for the following components:   Glucose-Capillary 120 (*)    All other components within normal limits  CBG MONITORING, ED - Abnormal; Notable for the following components:   Glucose-Capillary 265 (*)    All other components within normal limits  SARS CORONAVIRUS 2 BY RT PCR (HOSPITAL ORDER, PERFORMED IN  HOSPITAL LAB)  ETHANOL  PROTIME-INR  APTT  DIFFERENTIAL  RAPID URINE DRUG SCREEN, HOSP PERFORMED  AMMONIA  HIV ANTIBODY (ROUTINE TESTING W REFLEX)  SODIUM, URINE, RANDOM  CREATININE, URINE, RANDOM  TSH  HEMOGLOBIN A1C  LIPID PANEL    EKG EKG Interpretation  Date/Time:  Thursday February 20 2020 11:55:33 EDT Ventricular Rate:  67 PR Interval:    QRS Duration: 132 QT Interval:  431 QTC Calculation: 455 R Axis:   -65 Text Interpretation: Sinus rhythm Prolonged PR interval Nonspecific IVCD with LAD Left ventricular hypertrophy since last tracing no significant change Confirmed by Mancel Bale [phone]) on 02/20/2020 1:59:25 PM   Radiology MR ANGIO HEAD WO CONTRAST  Result Date: 02/20/2020 CLINICAL DATA:  Code stroke follow-up EXAM: MRI HEAD WITHOUT CONTRAST MRA HEAD WITHOUT CONTRAST TECHNIQUE: Multiplanar, multiecho pulse sequences of the brain and  surrounding structures were obtained without intravenous contrast. Angiographic images of the head were obtained using MRA  technique without contrast. COMPARISON:  08/21/2019 MRI head, 01/08/2019 MRA head FINDINGS: MRI HEAD Brain: There is small area of cortical/subcortical reduced diffusion along the left precentral gyrus at the lateral aspect of the hand motor region. There is some corresponding susceptibility likely reflecting petechial hemorrhage. Chronic left MCA territory infarction centered in the left temporal lobe. Chronic left frontoparietal cortical infarcts. Chronic left thalamic infarct. Minimal additional foci of T2 hyperintensity in the supratentorial white matter likely reflect minor chronic microvascular ischemic changes. There is no intracranial mass or mass effect. There is no hydrocephalus or extra-axial fluid collection. Ventricles are stable in size. Vascular: Major vessel flow voids at the skull base are preserved. Skull and upper cervical spine: Normal marrow signal is preserved. Sinuses/Orbits: Paranasal sinuses are aerated. Orbits are unremarkable. Other: Sella is unremarkable.  Mastoid air cells are clear. MRA HEAD Intracranial internal carotid arteries are patent with atherosclerotic irregularity and moderate stenoses along cavernous and paraclinoid portions. Appearance is similar to the prior study. Middle and anterior cerebral arteries are patent. Intracranial vertebral arteries, basilar artery, posterior cerebral arteries are patent. There is no aneurysm. IMPRESSION: Small acute infarct of the left frontal lobe involving the primary motor cortex. Corresponding susceptibility reflecting petechial hemorrhage. No proximal intracranial vessel occlusion. Similar bilateral ICA stenoses. Stable additional chronic findings detailed above. Electronically Signed   By: Guadlupe SpanishPraneil  Patel M.D.   On: 02/20/2020 18:45   MR BRAIN WO CONTRAST  Result Date: 02/20/2020 CLINICAL DATA:  Code stroke follow-up EXAM: MRI HEAD WITHOUT CONTRAST MRA HEAD WITHOUT CONTRAST TECHNIQUE: Multiplanar, multiecho pulse sequences of the  brain and surrounding structures were obtained without intravenous contrast. Angiographic images of the head were obtained using MRA technique without contrast. COMPARISON:  08/21/2019 MRI head, 01/08/2019 MRA head FINDINGS: MRI HEAD Brain: There is small area of cortical/subcortical reduced diffusion along the left precentral gyrus at the lateral aspect of the hand motor region. There is some corresponding susceptibility likely reflecting petechial hemorrhage. Chronic left MCA territory infarction centered in the left temporal lobe. Chronic left frontoparietal cortical infarcts. Chronic left thalamic infarct. Minimal additional foci of T2 hyperintensity in the supratentorial white matter likely reflect minor chronic microvascular ischemic changes. There is no intracranial mass or mass effect. There is no hydrocephalus or extra-axial fluid collection. Ventricles are stable in size. Vascular: Major vessel flow voids at the skull base are preserved. Skull and upper cervical spine: Normal marrow signal is preserved. Sinuses/Orbits: Paranasal sinuses are aerated. Orbits are unremarkable. Other: Sella is unremarkable.  Mastoid air cells are clear. MRA HEAD Intracranial internal carotid arteries are patent with atherosclerotic irregularity and moderate stenoses along cavernous and paraclinoid portions. Appearance is similar to the prior study. Middle and anterior cerebral arteries are patent. Intracranial vertebral arteries, basilar artery, posterior cerebral arteries are patent. There is no aneurysm. IMPRESSION: Small acute infarct of the left frontal lobe involving the primary motor cortex. Corresponding susceptibility reflecting petechial hemorrhage. No proximal intracranial vessel occlusion. Similar bilateral ICA stenoses. Stable additional chronic findings detailed above. Electronically Signed   By: Guadlupe SpanishPraneil  Patel M.D.   On: 02/20/2020 18:45   CT HEAD CODE STROKE WO CONTRAST  Result Date: 02/20/2020 CLINICAL  DATA:  Code stroke.  Prior CVA, slurred speech and aphasia. EXAM: CT HEAD WITHOUT CONTRAST TECHNIQUE: Contiguous axial images were obtained from the base of the skull through the vertex without intravenous contrast. COMPARISON:  Brain MRI 08/21/2019. FINDINGS:  Brain: Stable, mild generalized parenchymal atrophy. Redemonstrated remote left MCA territory cortical/subcortical infarct affecting portions of the left temporal, parietal and occipital lobes as well as posterior left insula. Additional known small chronic cortically based infarcts within the left parietal and frontal lobes as previously demonstrated on the MRI of 08/21/2019. Redemonstrated chronic left thalamic lacunar infarct. There is no evidence of acute intracranial hemorrhage. No acute demarcated cortical infarction is identified. No extra-axial fluid collection. No evidence of intracranial mass. No midline shift. Vascular: No hyperdense vessel.  Atherosclerotic calcifications. Skull: Normal. Negative for fracture or focal lesion. Sinuses/Orbits: Visualized orbits show no acute finding. No significant paranasal sinus disease or mastoid effusion at the imaged levels. ASPECTS [facility] Stroke Program Early CT Score) - Ganglionic level infarction (caudate, lentiform nuclei, internal capsule, insula, M1-M3 cortex): 7 - Supraganglionic infarction (M4-M6 cortex): 3 Total score (0-10 with 10 being normal): 10 (when discounting chronic infarction changes). These results were communicated to Dr. Laurence Slate At 11:56 amon 7/22/2021by text page via the [facility] messaging system. IMPRESSION: No CT evidence of acute intracranial abnormality. ASPECTS is 10. Redemonstrated chronic cortical/subcortical left MCA infarct affecting portions of the left temporal, parietal and occipital lobes as well as posterior left insula. Additional small chronic cortical infarcts within the left parietal and frontal lobes, unchanged. Redemonstrated chronic left thalamic lacunar infarct. Stable,  mild generalized parenchymal atrophy. Electronically Signed   By: Jackey Loge DO   On: 02/20/2020 11:57    Procedures .Critical Care Performed by: Arthor Captain, PA-C Authorized by: Arthor Captain, PA-C   Critical care provider statement:    Critical care time (minutes):  60   Critical care time was exclusive of:  Separately billable procedures and treating other patients   Critical care was necessary to treat or prevent imminent or life-threatening deterioration of the following conditions:  CNS failure or compromise   Critical care was time spent personally by me on the following activities:  Discussions with consultants, evaluation of patient's response to treatment, examination of patient, ordering and performing treatments and interventions, ordering and review of laboratory studies, ordering and review of radiographic studies, pulse oximetry, re-evaluation of patient's condition, obtaining history from patient or surrogate and review of old charts   (including critical care time)  Medications Ordered in ED Medications  clopidogrel (PLAVIX) tablet 75 mg (has no administration in time range)  0.9 %  sodium chloride infusion ( Intravenous New Bag/Given 02/20/20 2206)  acetaminophen (TYLENOL) tablet 650 mg (has no administration in time range)    Or  acetaminophen (TYLENOL) 160 MG/5ML solution 650 mg (has no administration in time range)    Or  acetaminophen (TYLENOL) suppository 650 mg (has no administration in time range)  senna-docusate (Senokot-S) tablet 1 tablet (has no administration in time range)  heparin injection 5,000 Units (5,000 Units Subcutaneous Given 02/20/20 2129)  atorvastatin (LIPITOR) tablet 40 mg (40 mg Oral Given 02/20/20 2129)  insulin aspart (novoLOG) injection 0-15 Units (has no administration in time range)  insulin detemir (LEVEMIR) injection 54 Units (54 Units Subcutaneous Given 02/20/20 2206)  aspirin EC tablet 81 mg (has no administration in time range)    LORazepam (ATIVAN) injection 1 mg (1 mg Intravenous Given 02/20/20 1725)  clopidogrel (PLAVIX) tablet 300 mg (300 mg Oral Given 02/20/20 2046)  sodium chloride 0.9 % bolus 500 mL (0 mLs Intravenous Stopped 02/20/20 2207)   stroke: mapping our early stages of recovery book ( Does not apply Given 02/20/20 2207)    ED Course  I have  reviewed the triage vital signs and the nursing notes.  Pertinent labs & imaging results that were available during my care of the patient were reviewed by me and considered in my medical decision making (see chart for details).    MDM Rules/Calculators/A&P                         Patient is anticoagulated on a baby asa.  CC: Altered mental status VS:  Vitals:   02/20/20 2115 02/20/20 2130 02/20/20 2200 02/20/20 2230  BP: (!) 146/66 (!) 156/67 (!) 153/70 (!) 160/63  Pulse: 75 85 93 101  Resp: 21 14 17  (!) 25  Temp:      TempSrc:      SpO2: 98% 97% 96% 94%  Weight:      Height:        is gathered by patient and family at bedside. Previous records obtained and reviewed. DDX:The patient's complaint of altered mental status involves an extensive number of diagnostic and treatment options, and is a complaint that carries with it a high risk of complications, morbidity, and potential mortality. Given the large differential diagnosis, medical decision making is of high complexity. The differential diagnosis for AMS is extensive and includes, but is not limited to: drug overdose - opioids, alcohol, sedatives, antipsychotics, drug withdrawal, others; Metabolic: hypoxia, hypoglycemia, hyperglycemia, hypercalcemia, hypernatremia, hyponatremia, uremia, hepatic encephalopathy, hypothyroidism, hyperthyroidism, vitamin B12 or thiamine deficiency, carbon monoxide poisoning, Wilson's disease, Lactic acidosis, DKA/HHOS; Infectious: meningitis, encephalitis, bacteremia/sepsis, urinary tract infection, pneumonia, neurosyphilis; Structural: Space-occupying lesion, (brain  tumor, subdural hematoma, hydrocephalus,); Vascular: stroke, subarachnoid hemorrhage, coronary ischemia, hypertensive encephalopathy, CNS vasculitis, thrombotic thrombocytopenic purpura, disseminated intravascular coagulation, hyperviscosity; Psychiatric: Schizophrenia, depression; Other: Seizure, hypothermia, heat stroke, ICU psychosis, dementia -"sundowning."   Labs: I ordered reviewed and interpreted labs which include urine which appears within normal limits, Covid test which is negative, UDS negative.  CMP shows elevated blood glucose, renal insufficiency, CBC without significant abnormalities, ethanol within normal limits, PT/INR and APTT within normal limits. Imaging: I ordered and reviewed images which included CT head, MRI and MRA of the brain. I independently visualized and interpreted all imaging.  CT head without significant abnormalities however significant findings on the MRI/MRA of the brain include acute stroke.  EKG: Normal sinus rhythm at a rate of sixty-two Consults: Dr. UX:[id number] for acute stroke MDM: Patient with acute CVA.  She will be admitted to the hospitalist service.  This case was discussed with Dr. Amada Jupiter who admit for the hospitalist service.  Discussed findings with the patient to agrees with plan for admission Patient disposition: Admit The patient appears reasonably stabilized for admission considering the current resources, flow, and capabilities available in the ED at this time, and I doubt any other [facility] requiring further screening and/or treatment in the ED prior to admission.        Final Clinical Impression(s) / ED Diagnoses Final diagnoses:  Cerebrovascular accident (CVA), unspecified mechanism [facility])    Rx / DC Orders ED Discharge Orders    None           [facility], PA-C 02/20/20 2308    02/22/20, MD 02/21/20 2102

## 2020-02-20 NOTE — Consult Note (Addendum)
Neurology Consultation  Reason for Consult: Code stroke Referring Physician: Dr. Effie Shy  CC: Aphasia and dysarthria  History is obtained from: EMS and patient  HPI: Catherine Bautista is a 69 y.o. female with history of strokes, obesity, hypertension, dyslipidemia, diabetes.  Patient was seen 3 years ago in the hospital with a 5 mm subtle focus of diffusion abnormality in the left precentral gyrus.  At that time echo showed 60 to 65% EF, carotids were normal, TEE at that time also showed a questionable redundant chordae tendonae but could not rule out small vegetation.  That time loop recorder was placed.  She was enrolled in the RESPECT ESUS trial and thus finished.  At that time was placed on Plavix alone.  Then 2 years ago patient again came to the ED with right hand weakness.  MRI did show a left posterior frontal infarct embolic secondary to unknown source in patient with previous cryptogenic infarcts.  Patient was also found to have an elevated factor VIII however elevated by hematology and stated likely not clinically significant.  Loop recorder did not show any new A. fib.  Patient arrived by EMS as code stroke today, her symptoms today were slurred speech headache and some aphasia.  Symptoms started approximately 930 this morning.  She states that she she answered a phone call that was for her son and noted she had difficulty with speech.  Her daughter called 911.  In route her slurred speech apparently resolved however she was noted to have some aphasia per EMS.  During CT scan patient did state that she felt a little bit improved.  CT head did not show any new acute stroke however did reveal revealed stroke's  ED course   Relevant labs include -creatinine 1.8, blood glucose 185.  CT head shows-no CT evidence of acute intracranial abnormality.  Redemonstrated chronic cortical/subcortical left MCA infarct affecting portions of the left temporal, parietal and occipital lobes as well as posterior left  insula.  In addition small chronic cortical infarcts within the left parietal and frontal lobes, unchanged.  Redemonstrated chronic left thalamic lacunar infarct.  LKW: 10:00 tpa given?: no,mild resolving symptoms  Premorbid modified Rankin scale (mRS): Zero NIH stroke score 0   Past Medical History:  Diagnosis Date   Anemia    Anemia, vitamin B12 deficiency    Depression with anxiety    Diabetes mellitus    Dyslipidemia    Fibromyalgia    Graves disease    Hypertension    Obesity    Stroke (HCC)    "I've had 3 strokes"    Family History  Problem Relation Age of Onset   Stroke Mother    Heart failure Mother    Lung cancer Father    Kidney cancer Brother    Stroke Cousin    Social History:   reports that she has never smoked. She has never used smokeless tobacco. She reports that she does not drink alcohol and does not use drugs.  Medications No current facility-administered medications for this encounter.  Current Outpatient Medications:    ACCU-CHEK SMARTVIEW test strip, USE TO CHECK YOUR BLOOD SUGAR THREE TIMES A DAY DX E13.9 IN VITRO 90 DAYS, Disp: , Rfl:    atorvastatin (LIPITOR) 40 MG tablet, Take 1 tablet (40 mg total) by mouth daily at 6 PM. (Patient taking differently: Take 40 mg by mouth at bedtime. ), Disp: 30 tablet, Rfl: 2   b complex vitamins tablet, Take 1 tablet by mouth daily. Reported  on 01/13/2016, Disp: , Rfl:    BD PEN NEEDLE NANO 2ND GEN 32G X 4 MM MISC, USE TO TEST BLOOD SUGAR 3 TIMES DAILY, Disp: , Rfl:    Cholecalciferol (VITAMIN D3) 50 MCG (2000 UT) TABS, Take 2,000 Units by mouth daily., Disp: , Rfl:    DULoxetine (CYMBALTA) 30 MG capsule, Take 30 mg by mouth daily. , Disp: , Rfl: 4   ferrous sulfate 325 (65 FE) MG tablet, Take 325 mg by mouth daily with breakfast., Disp: , Rfl:    glimepiride (AMARYL) 4 MG tablet, Take 4 mg by mouth daily with breakfast., Disp: , Rfl:    hydrochlorothiazide (HYDRODIURIL) 25 MG tablet, Take 1 tablet (25 mg total)  by mouth daily., Disp: , Rfl:    Insulin Detemir (LEVEMIR FLEXTOUCH) 100 UNIT/ML Pen, Inject 48 Units into the skin at bedtime., Disp: , Rfl:    JANUVIA 100 MG tablet, Take 100 mg by mouth daily., Disp: , Rfl: 2   Magnesium 300 MG CAPS, Take 300 mg by mouth daily., Disp: , Rfl:    metFORMIN (GLUCOPHAGE-XR) 750 MG 24 hr tablet, Take 750 mg by mouth 2 (two) times a day., Disp: , Rfl:    Multiple Vitamins-Minerals (HAIR/SKIN/NAILS) CAPS, Take 1 capsule by mouth daily. , Disp: , Rfl:    OMEGA 3 1200 MG CAPS, Take 1,200 mg by mouth at bedtime. , Disp: , Rfl:    ramipril (ALTACE) 10 MG capsule, Take 10 mg by mouth daily.  , Disp: , Rfl:    traMADol (ULTRAM) 50 MG tablet, Take 1 tablet by mouth as needed., Disp: , Rfl:    vitamin B-12 (CYANOCOBALAMIN) 1000 MCG tablet, Take 1,000 mcg by mouth daily. , Disp: , Rfl:    vitamin C (ASCORBIC ACID) 500 MG tablet, Take 500 mg by mouth daily., Disp: , Rfl:   ROS:   General ROS: Positive for -headache Psychological ROS: negative for - behavioral disorder, hallucinations, memory difficulties, mood swings or suicidal ideation Ophthalmic ROS: negative for - blurry vision, double vision, eye pain or loss of vision ENT ROS: negative for - epistaxis, nasal discharge, oral lesions, sore throat, tinnitus or vertigo Allergy and Immunology ROS: negative for - hives or itchy/watery eyes Hematological and Lymphatic ROS: negative for - bleeding problems, bruising or swollen lymph nodes Endocrine ROS: negative for - galactorrhea, hair pattern changes, polydipsia/polyuria or temperature intolerance Respiratory ROS: negative for - cough, hemoptysis, shortness of breath or wheezing Cardiovascular ROS: negative for - chest pain, dyspnea on exertion, edema or irregular heartbeat Gastrointestinal ROS: negative for - abdominal pain, diarrhea, hematemesis, nausea/vomiting or stool incontinence Genito-Urinary ROS: negative for - dysuria, hematuria, incontinence or urinary  frequency/urgency Musculoskeletal ROS: negative for - joint swelling or muscular weakness Neurological ROS: as noted in HPI Dermatological ROS: negative for rash and skin lesion changes  Exam: Current vital signs: Ht 5\' 6"  (1.676 m)   Wt (!) 129 kg   BMI 45.90 kg/m  Vital signs in last 24 hours: Weight:  [129 kg] 129 kg (07/22 1100)   Constitutional: Appears well-developed and well-nourished.  Psych: Affect appropriate to situation Eyes: No scleral injection HENT: No OP obstrucion Head: Normocephalic.  Cardiovascular: Normal rate and regular rhythm.  Respiratory: Effort normal, non-labored breathing GI: Soft.  No distension. There is no tenderness.  Skin: WDI  Neuro: Mental Status: Patient is awake, alert, oriented to person, place, month, year, and situation.  Speech is slightly delayed with intermittent stuttering component, but no significant appreciable aphasia or  dysarthria.  There is a component of stuttering.  Naming, repeating, comprehension is intact.  She is able to follow all commands without difficulty.  Patient is able to give a clear and coherent history. Cranial Nerves: II: Visual Fields are full.  III,IV, VI: EOMI without ptosis or diploplia. Pupils equal, round and reactive to light V: Facial sensation is symmetric to temperature VII: Facial movement is symmetric.  VIII: hearing is intact to voice X: Palat elevates symmetrically XI: Shoulder shrug is symmetric. XII: tongue is midline without atrophy or fasciculations.  Motor: Tone is normal. Bulk is normal. 5/5 strength was present in all four extremities.  No drift Sensory: Sensation is symmetric to light touch and temperature in the arms and legs. Intact DSS Deep Tendon Reflexes: 2+ and symmetric in the biceps no ankle jerk or knee jerk Plantars: Toes are downgoing bilaterally.  Cerebellar: FNF and HKS are intact bilaterally  Labs I have reviewed labs in epic and the results pertinent to this  consultation are:   CBC    Component Value Date/Time   WBC 6.7 02/20/2020 1139   RBC 4.14 02/20/2020 1139   HGB 11.9 (L) 02/20/2020 1141   HGB 11.5 08/14/2019 1339   HGB 11.2 (L) 07/04/2011 1326   HCT 35.0 (L) 02/20/2020 1141   HCT 35.8 08/14/2019 1339   HCT 33.4 (L) 07/04/2011 1326   PLT 303 02/20/2020 1139   PLT 332 08/14/2019 1339   MCV 90.3 02/20/2020 1139   MCV 87 08/14/2019 1339   MCV 85.1 07/04/2011 1326   MCH 28.0 02/20/2020 1139   MCHC 31.0 02/20/2020 1139   RDW 13.9 02/20/2020 1139   RDW 13.0 08/14/2019 1339   RDW 15.5 (H) 07/04/2011 1326   LYMPHSABS 2.2 02/20/2020 1139   LYMPHSABS 1.6 07/04/2011 1326   MONOABS 0.5 02/20/2020 1139   MONOABS 0.5 07/04/2011 1326   EOSABS 0.1 02/20/2020 1139   EOSABS 0.1 07/04/2011 1326   BASOSABS 0.1 02/20/2020 1139   BASOSABS 0.0 07/04/2011 1326    CMP     Component Value Date/Time   NA 137 02/20/2020 1141   NA 141 08/14/2019 1339   K 4.0 02/20/2020 1141   CL 105 02/20/2020 1141   CO2 24 08/14/2019 1339   GLUCOSE 185 (H) 02/20/2020 1141   BUN 32 (H) 02/20/2020 1141   BUN 15 08/14/2019 1339   CREATININE 1.80 (H) 02/20/2020 1141   CALCIUM 9.0 08/14/2019 1339   PROT 6.5 08/14/2019 1339   ALBUMIN 3.8 08/14/2019 1339   AST 27 08/14/2019 1339   ALT 38 (H) 08/14/2019 1339   ALKPHOS 131 (H) 08/14/2019 1339   BILITOT 0.3 08/14/2019 1339   GFRNONAA 53 (L) 08/14/2019 1339   GFRAA 61 08/14/2019 1339    Lipid Panel     Component Value Date/Time   CHOL 107 01/08/2019 0405   TRIG 49 01/08/2019 0405   HDL 41 01/08/2019 0405   CHOLHDL 2.6 01/08/2019 0405   VLDL 10 01/08/2019 0405   LDLCALC 56 01/08/2019 0405     Imaging I have reviewed the images obtained:  CT head shows-no CT evidence of acute intracranial abnormality.  Redemonstrated chronic cortical/subcortical left MCA infarct affecting portions of the left temporal, parietal and occipital lobes as well as posterior left insula.  In addition small chronic cortical  infarcts within the left parietal and frontal lobes, unchanged.  Redemonstrated chronic left thalamic lacunar infarct.   Felicie Morn PA-C [facility] [phone]  M-F  (9:00 am- 5:00 PM)  02/20/2020,  11:51 AM     Assessment:  69 year old female with multiple strokes in the past.  She has had a significant work-up in the past in addition.  Patient arrives to the hospital today as code stroke secondary to aphasia and slurred speech.  On exam patient showed prolonged thought process at times and stuttering at times however it should be noted that there were other times that she had no difficulty speaking.  CT head was negative for any acute stroke.  At this point feel most likely this would be recrudescence of previous symptoms possibly in the setting of UTI.  Impression: -Recrudesence of previous infarct vs new acute infarction vs TIA  Recommend --MRI brain without contrast and MRA of Head without contrast. -If stroke is visualized would further stroke work-up. -Continue aspirin  -Lipitor 80 mg daily - AIC and lipid profile if not recently completed   NEUROHOSPITALIST ADDENDUM Performed a face to face diagnostic evaluation.   I have reviewed the contents of history and physical exam as documented by PA/ARNP/Resident and agree with above documentation.  I have discussed and formulated the above plan as documented. Edits to the note have been made as needed.  69 year old female with history of multiple strokes -cryptogenic. Presents today due to aphasia that occurred between 9:30 AM to 10 AM, as a code stroke. Symptoms improved in route, had only minimal difficulty with speech on arrival to [facility] ED. stat CT head was obtained showed no acute findings. Not a candidate for TPA due to mild or resolving symptoms. Recommend MRI brain and MRA of the head     Monya Kozakiewicz MD [facility] [phone]   If 7pm to 7am, please call on call as listed on AMION.

## 2020-02-21 ENCOUNTER — Inpatient Hospital Stay (HOSPITAL_COMMUNITY): Payer: Medicare PPO

## 2020-02-21 ENCOUNTER — Other Ambulatory Visit: Payer: Self-pay

## 2020-02-21 DIAGNOSIS — Z794 Long term (current) use of insulin: Secondary | ICD-10-CM

## 2020-02-21 DIAGNOSIS — E785 Hyperlipidemia, unspecified: Secondary | ICD-10-CM

## 2020-02-21 DIAGNOSIS — G4730 Sleep apnea, unspecified: Secondary | ICD-10-CM

## 2020-02-21 DIAGNOSIS — I639 Cerebral infarction, unspecified: Secondary | ICD-10-CM

## 2020-02-21 DIAGNOSIS — I6389 Other cerebral infarction: Secondary | ICD-10-CM

## 2020-02-21 DIAGNOSIS — I10 Essential (primary) hypertension: Secondary | ICD-10-CM

## 2020-02-21 DIAGNOSIS — E1165 Type 2 diabetes mellitus with hyperglycemia: Secondary | ICD-10-CM

## 2020-02-21 DIAGNOSIS — E0500 Thyrotoxicosis with diffuse goiter without thyrotoxic crisis or storm: Secondary | ICD-10-CM

## 2020-02-21 DIAGNOSIS — E1159 Type 2 diabetes mellitus with other circulatory complications: Secondary | ICD-10-CM

## 2020-02-21 DIAGNOSIS — E1169 Type 2 diabetes mellitus with other specified complication: Secondary | ICD-10-CM

## 2020-02-21 LAB — BASIC METABOLIC PANEL
Anion gap: 7 (ref 5–15)
BUN: 25 mg/dL — ABNORMAL HIGH (ref 8–23)
CO2: 23 mmol/L (ref 22–32)
Calcium: 8.8 mg/dL — ABNORMAL LOW (ref 8.9–10.3)
Chloride: 106 mmol/L (ref 98–111)
Creatinine, Ser: 1.35 mg/dL — ABNORMAL HIGH (ref 0.44–1.00)
GFR calc Af Amer: 47 mL/min — ABNORMAL LOW (ref >60)
GFR calc non Af Amer: 41 mL/min — ABNORMAL LOW (ref >60)
Glucose, Bld: 152 mg/dL — ABNORMAL HIGH (ref 70–99)
Potassium: 4.2 mmol/L (ref 3.5–5.1)
Sodium: 136 mmol/L (ref 135–145)

## 2020-02-21 LAB — PHOSPHORUS: Phosphorus: 3.3 mg/dL (ref 2.5–4.6)

## 2020-02-21 LAB — LIPID PANEL
Cholesterol: 140 mg/dL (ref 0–200)
HDL: 43 mg/dL (ref >40)
LDL Cholesterol: 87 mg/dL (ref 0–99)
Total CHOL/HDL Ratio: 3.3 RATIO
Triglycerides: 50 mg/dL (ref <150)
VLDL: 10 mg/dL (ref 0–40)

## 2020-02-21 LAB — ECHOCARDIOGRAM COMPLETE BUBBLE STUDY
Calc EF: 55.3 %
S' Lateral: 3 cm
Single Plane A2C EF: 51.4 %
Single Plane A4C EF: 57 %

## 2020-02-21 LAB — CBG MONITORING, ED
Glucose-Capillary: 116 mg/dL — ABNORMAL HIGH (ref 70–99)
Glucose-Capillary: 131 mg/dL — ABNORMAL HIGH (ref 70–99)
Glucose-Capillary: 237 mg/dL — ABNORMAL HIGH (ref 70–99)

## 2020-02-21 LAB — URIC ACID: Uric Acid, Serum: 6.9 mg/dL (ref 2.5–7.1)

## 2020-02-21 LAB — LACTATE DEHYDROGENASE: LDH: 171 U/L (ref 98–192)

## 2020-02-21 LAB — GLUCOSE, CAPILLARY: Glucose-Capillary: 334 mg/dL — ABNORMAL HIGH (ref 70–99)

## 2020-02-21 LAB — HEMOGLOBIN A1C
Hgb A1c MFr Bld: 9.2 % — ABNORMAL HIGH (ref 4.8–5.6)
Mean Plasma Glucose: 217.34 mg/dL

## 2020-02-21 LAB — MAGNESIUM: Magnesium: 2.1 mg/dL (ref 1.7–2.4)

## 2020-02-21 MED ORDER — STUDY - AXIOMATIC STUDY - CLOPIDOGREL 75MG (PI-SETHI)
300.0000 mg | ORAL_TABLET | Freq: Once | ORAL | Status: AC
  Administered 2020-02-21: 300 mg via ORAL
  Filled 2020-02-21: qty 300

## 2020-02-21 MED ORDER — STUDY - AXIOMATIC STUDY - CLOPIDOGREL 75MG (PI-SETHI)
75.0000 mg | ORAL_TABLET | ORAL | Status: DC
  Administered 2020-02-22: 75 mg via ORAL
  Filled 2020-02-21 (×3): qty 75

## 2020-02-21 MED ORDER — STUDY - AXIOMATIC STUDY - BMS986177 OR PLACEBO (PI-SETHI)
2.0000 | ORAL_CAPSULE | Freq: Two times a day (BID) | ORAL | Status: DC
  Administered 2020-02-21 – 2020-02-22 (×2): 2 via ORAL
  Filled 2020-02-21 (×5): qty 2

## 2020-02-21 MED ORDER — ATORVASTATIN CALCIUM 80 MG PO TABS
80.0000 mg | ORAL_TABLET | Freq: Every day | ORAL | Status: DC
  Administered 2020-02-21: 80 mg via ORAL
  Filled 2020-02-21: qty 1

## 2020-02-21 MED ORDER — STUDY - AXIOMATIC STUDY - ASPIRIN 100 MG (PI-SETHI)
100.0000 mg | ORAL_TABLET | Freq: Every day | ORAL | Status: DC
  Administered 2020-02-21 – 2020-02-22 (×2): 100 mg via ORAL
  Filled 2020-02-21 (×4): qty 100

## 2020-02-21 NOTE — Progress Notes (Signed)
STROKE TEAM PROGRESS NOTE   INTERVAL HISTORY I have personally reviewed history of presenting illness with the patient, electronic medical records and imaging films in PACS.  She presented with sudden onset of expressive aphasia and word finding difficulties starting yesterday at 10 AM.  Symptoms started resolving by the time she reached the hospital and she has remained stable since then.  MRI scan shows embolic left frontal cortical small infarct.  She has prior history of multiple cryptogenic strokes cryptogenicstrokes in left MCA in 2002, right frontal in 2011, left frontal cortical infarct 2017and most recently 12/2018 with left frontal MCA branch infarct.  She was taking aspirin regularly and states she has been compliant with her medication regimen and risk factor control  Vitals:   02/21/20 0516 02/21/20 0518 02/21/20 0820 02/21/20 1017  BP: (!) 143/66  (!) 140/66 (!) 157/73  Pulse:  56  62  Resp:      Temp:      TempSrc:      SpO2:  100%  100%  Weight:      Height:       CBC:  Recent Labs  Lab 02/20/20 1139 02/20/20 1141  WBC 6.7  --   NEUTROABS 3.8  --   HGB 11.6* 11.9*  HCT 37.4 35.0*  MCV 90.3  --   PLT 303  --    Basic Metabolic Panel:  Recent Labs  Lab 02/20/20 1139 02/20/20 1141  NA 136 137  K 4.2 4.0  CL 104 105  CO2 22  --   GLUCOSE 187* 185*  BUN 30* 32*  CREATININE 1.67* 1.80*  CALCIUM 8.7*  --    Lipid Panel:  Recent Labs  Lab 02/21/20 0502  CHOL 140  TRIG 50  HDL 43  CHOLHDL 3.3  VLDL 10  LDLCALC 87   HgbA1c:  Recent Labs  Lab 02/21/20 0502  HGBA1C 9.2*   Urine Drug Screen:  Recent Labs  Lab 02/20/20 1710  LABOPIA NONE DETECTED  COCAINSCRNUR NONE DETECTED  LABBENZ NONE DETECTED  AMPHETMU NONE DETECTED  THCU NONE DETECTED  LABBARB NONE DETECTED    Alcohol Level  Recent Labs  Lab 02/20/20 1139  ETH <10    IMAGING past 24 hours MR ANGIO HEAD WO CONTRAST  Result Date: 02/20/2020 CLINICAL DATA:  Code stroke follow-up  EXAM: MRI HEAD WITHOUT CONTRAST MRA HEAD WITHOUT CONTRAST TECHNIQUE: Multiplanar, multiecho pulse sequences of the brain and surrounding structures were obtained without intravenous contrast. Angiographic images of the head were obtained using MRA technique without contrast. COMPARISON:  08/21/2019 MRI head, 01/08/2019 MRA head FINDINGS: MRI HEAD Brain: There is small area of cortical/subcortical reduced diffusion along the left precentral gyrus at the lateral aspect of the hand motor region. There is some corresponding susceptibility likely reflecting petechial hemorrhage. Chronic left MCA territory infarction centered in the left temporal lobe. Chronic left frontoparietal cortical infarcts. Chronic left thalamic infarct. Minimal additional foci of T2 hyperintensity in the supratentorial white matter likely reflect minor chronic microvascular ischemic changes. There is no intracranial mass or mass effect. There is no hydrocephalus or extra-axial fluid collection. Ventricles are stable in size. Vascular: Major vessel flow voids at the skull base are preserved. Skull and upper cervical spine: Normal marrow signal is preserved. Sinuses/Orbits: Paranasal sinuses are aerated. Orbits are unremarkable. Other: Sella is unremarkable.  Mastoid air cells are clear. MRA HEAD Intracranial internal carotid arteries are patent with atherosclerotic irregularity and moderate stenoses along cavernous and paraclinoid portions. Appearance is similar to  the prior study. Middle and anterior cerebral arteries are patent. Intracranial vertebral arteries, basilar artery, posterior cerebral arteries are patent. There is no aneurysm. IMPRESSION: Small acute infarct of the left frontal lobe involving the primary motor cortex. Corresponding susceptibility reflecting petechial hemorrhage. No proximal intracranial vessel occlusion. Similar bilateral ICA stenoses. Stable additional chronic findings detailed above. Electronically Signed   By:  Guadlupe Spanish M.D.   On: 02/20/2020 18:45   MR BRAIN WO CONTRAST  Result Date: 02/20/2020 CLINICAL DATA:  Code stroke follow-up EXAM: MRI HEAD WITHOUT CONTRAST MRA HEAD WITHOUT CONTRAST TECHNIQUE: Multiplanar, multiecho pulse sequences of the brain and surrounding structures were obtained without intravenous contrast. Angiographic images of the head were obtained using MRA technique without contrast. COMPARISON:  08/21/2019 MRI head, 01/08/2019 MRA head FINDINGS: MRI HEAD Brain: There is small area of cortical/subcortical reduced diffusion along the left precentral gyrus at the lateral aspect of the hand motor region. There is some corresponding susceptibility likely reflecting petechial hemorrhage. Chronic left MCA territory infarction centered in the left temporal lobe. Chronic left frontoparietal cortical infarcts. Chronic left thalamic infarct. Minimal additional foci of T2 hyperintensity in the supratentorial white matter likely reflect minor chronic microvascular ischemic changes. There is no intracranial mass or mass effect. There is no hydrocephalus or extra-axial fluid collection. Ventricles are stable in size. Vascular: Major vessel flow voids at the skull base are preserved. Skull and upper cervical spine: Normal marrow signal is preserved. Sinuses/Orbits: Paranasal sinuses are aerated. Orbits are unremarkable. Other: Sella is unremarkable.  Mastoid air cells are clear. MRA HEAD Intracranial internal carotid arteries are patent with atherosclerotic irregularity and moderate stenoses along cavernous and paraclinoid portions. Appearance is similar to the prior study. Middle and anterior cerebral arteries are patent. Intracranial vertebral arteries, basilar artery, posterior cerebral arteries are patent. There is no aneurysm. IMPRESSION: Small acute infarct of the left frontal lobe involving the primary motor cortex. Corresponding susceptibility reflecting petechial hemorrhage. No proximal intracranial  vessel occlusion. Similar bilateral ICA stenoses. Stable additional chronic findings detailed above. Electronically Signed   By: Guadlupe Spanish M.D.   On: 02/20/2020 18:45   CT HEAD CODE STROKE WO CONTRAST  Result Date: 02/20/2020 CLINICAL DATA:  Code stroke.  Prior CVA, slurred speech and aphasia. EXAM: CT HEAD WITHOUT CONTRAST TECHNIQUE: Contiguous axial images were obtained from the base of the skull through the vertex without intravenous contrast. COMPARISON:  Brain MRI 08/21/2019. FINDINGS: Brain: Stable, mild generalized parenchymal atrophy. Redemonstrated remote left MCA territory cortical/subcortical infarct affecting portions of the left temporal, parietal and occipital lobes as well as posterior left insula. Additional known small chronic cortically based infarcts within the left parietal and frontal lobes as previously demonstrated on the MRI of 08/21/2019. Redemonstrated chronic left thalamic lacunar infarct. There is no evidence of acute intracranial hemorrhage. No acute demarcated cortical infarction is identified. No extra-axial fluid collection. No evidence of intracranial mass. No midline shift. Vascular: No hyperdense vessel.  Atherosclerotic calcifications. Skull: Normal. Negative for fracture or focal lesion. Sinuses/Orbits: Visualized orbits show no acute finding. No significant paranasal sinus disease or mastoid effusion at the imaged levels. ASPECTS [facility] Stroke Program Early CT Score) - Ganglionic level infarction (caudate, lentiform nuclei, internal capsule, insula, M1-M3 cortex): 7 - Supraganglionic infarction (M4-M6 cortex): 3 Total score (0-10 with 10 being normal): 10 (when discounting chronic infarction changes). These results were communicated to Dr. Laurence Slate At 11:56 amon 7/22/2021by text page via the [facility] messaging system. IMPRESSION: No CT evidence of acute intracranial abnormality. ASPECTS  is 10. Redemonstrated chronic cortical/subcortical left MCA infarct affecting portions  of the left temporal, parietal and occipital lobes as well as posterior left insula. Additional small chronic cortical infarcts within the left parietal and frontal lobes, unchanged. Redemonstrated chronic left thalamic lacunar infarct. Stable, mild generalized parenchymal atrophy. Electronically Signed   By: Jackey Loge DO   On: 02/20/2020 11:57   VAS [place] LOWER EXTREMITY VENOUS (DVT)  Result Date: 02/21/2020  Lower Venous DVTStudy Indications: Stroke.  Limitations: Pain tolerance. Comparison Study: 01/04/16 previous Performing Technologist: Blanch Media RVS  Examination Guidelines: A complete evaluation includes B-mode imaging, spectral Doppler, color Doppler, and power Doppler as needed of all accessible portions of each vessel. Bilateral testing is considered an integral part of a complete examination. Limited examinations for reoccurring indications may be performed as noted. The reflux portion of the exam is performed with the patient in reverse Trendelenburg.  +---------+---------------+---------+-----------+----------+-------------------+ RIGHT    CompressibilityPhasicitySpontaneityPropertiesThrombus Aging      +---------+---------------+---------+-----------+----------+-------------------+ CFV      Full           Yes      Yes                                      +---------+---------------+---------+-----------+----------+-------------------+ SFJ      Full                                                             +---------+---------------+---------+-----------+----------+-------------------+ FV Prox                 Yes      Yes                  unable to compress                                                        due to patient pain                                                       tolerance           +---------+---------------+---------+-----------+----------+-------------------+ FV Mid                  Yes      Yes                  unable to  compress                                                        due to patient pain  tolerance           +---------+---------------+---------+-----------+----------+-------------------+ FV DistalFull                                                             +---------+---------------+---------+-----------+----------+-------------------+ PFV      Full                                                             +---------+---------------+---------+-----------+----------+-------------------+ POP      Full           Yes      Yes                                      +---------+---------------+---------+-----------+----------+-------------------+ PTV      Full                                                             +---------+---------------+---------+-----------+----------+-------------------+ PERO     Full                                                             +---------+---------------+---------+-----------+----------+-------------------+   +---------+---------------+---------+-----------+----------+-------------------+ LEFT     CompressibilityPhasicitySpontaneityPropertiesThrombus Aging      +---------+---------------+---------+-----------+----------+-------------------+ CFV      Full           Yes      Yes                                      +---------+---------------+---------+-----------+----------+-------------------+ SFJ      Full                                                             +---------+---------------+---------+-----------+----------+-------------------+ FV Prox  Full                                                             +---------+---------------+---------+-----------+----------+-------------------+ FV Mid                  Yes      Yes                  unable to compress  due to  patient pain                                                       tolerance           +---------+---------------+---------+-----------+----------+-------------------+ FV Distal               Yes      Yes                  unable to compress                                                        due to patient pain                                                       tolerance           +---------+---------------+---------+-----------+----------+-------------------+ PFV      Full                                                             +---------+---------------+---------+-----------+----------+-------------------+ POP      Full           Yes      Yes                                      +---------+---------------+---------+-----------+----------+-------------------+ PTV      Full                                                             +---------+---------------+---------+-----------+----------+-------------------+ PERO     Full                                                             +---------+---------------+---------+-----------+----------+-------------------+    Summary: BILATERAL: - No evidence of deep vein thrombosis seen in the lower extremities, bilaterally. - No evidence of superficial venous thrombosis in the lower extremities, bilaterally. -   *See table(s) above for measurements and observations.    Preliminary     PHYSICAL EXAM Pleasant obese middle-aged African-American lady not in distress. . Afebrile. Head is nontraumatic. Neck is supple without bruit.    Cardiac exam no murmur or gallop. Lungs are clear to auscultation. Distal pulses are well felt. Neurological Exam ;  Awake  Alert oriented x  3. Normal speech and language.eye movements full without nystagmus.fundi were not visualized. Vision acuity and fields appear normal. Hearing is normal. Palatal movements are normal. Face symmetric. Tongue midline. Normal strength, tone,  reflexes and coordination.  Mild diminished fine finger movements on the right.  Orbits left over right upper extremity.  Normal sensation. Gait deferred. NIH stroke scale score is 0.  Premorbid modified Rankin 0 ASSESSMENT/PLAN Catherine Bautista is a 69 y.o. female with history of strokes, obesity, hypertension, dyslipidemia, diabetes presenting with slurred speech headache and some aphasia.   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 Old L MCA infarcts. Old L parietal and frontal lobe cortical infarcts. Old L thalamic lacune. Atrophy. ASPECTS 10.     MRI  Small L frontal lobe infarct in primary motor cortex w/ petechial hemorrhage.  MRA  No LVO. B ICA stenoses  Carotid Doppler  B ICA 1-39% stenosis, VAs antegrade   2D Echo w/ bubble EF 60-65%. No source of embolus. LA dilated. No obvious intra-atrial shunt.  LE Doppler  No DVT  LDL 87  HgbA1c 9.2  VTE prophylaxis - Heparin 5000 units sq tid   aspirin 81 mg daily prior to admission, now on aspirin 81 mg daily and clopidogrel 75 mg daily following plavix load. Continue DAPT x 3 weeks then plavix - considering for multiple studies - Dr. Pearlean Brownie will follow up    Therapy recommendations:  pending   Disposition:  pending   Hypertension  Stable . Permissive hypertension (OK if < 220/120) but gradually normalize in 5-7 days . Long-term BP goal normotensive  Hyperlipidemia  Home meds:  lipitor 40 & fish oil, resumed in hospital  LDL 87, goal < 70  Increase lipitor to 80  Continue statin at discharge  Diabetes type II Uncontrolled  HgbA1c 9.2, goal < 7.0  DB coordinator following  Other Stroke Risk Factors  Advanced age  Morbid Obesity, Body mass index is 45.9 kg/m., recommend weight loss, diet and exercise as appropriate   Hx stroke/TIA  12/2018 - L posterior frontal infarcts embolic secondary to unknown  source in patient with previous cryptogenic infarcts. found to have an elevated factor VIII however hematology stated likely not clinically significant.  Loop recorder did not show any new A. fib.   12/2015 - punctate left frontal cortical infarct - possibly embolic, source unknown. EF  60 to 65% EF, carotids were normal, TEE  showed a questionable redundant chordae tendonae but could not rule out small vegetation.  loop recorder was placed.  enrolled in the RESPECT ESUS, once completed trial was placed on Plavix alone.    Family hx stroke (mother, cousin)  obstructive sleep apnea uses CPAP nightly   Other Active Problems  AKI  Graves disease  Hospital day # 1 68-year lady with prior history of recurrent cryptogenic strokes who presents with transient episode of expressive aphasia with MRI showing embolic left frontal MCA branch infarct.  She remains at risk for recurrent strokes recommend aspirin and Plavix for 3 weeks followed by aspirin alone.  She may also consider possible participation in the [employer] stroke prevention trial if interested and will be given information to review and decide.  Discussed with Dr. Catha Gosselin greater than 50% time during this 35-minute visit was spent on counseling and coordination of care about her recurrent cryptogenic stroke and answering questions. Delia Heady, MD To contact Stroke Continuity  provider, please refer to [email or web address] After hours, contact General Neurology

## 2020-02-21 NOTE — Progress Notes (Signed)
  Echocardiogram 2D Echocardiogram has been performed.  Delcie Roch 02/21/2020, 9:49 AM

## 2020-02-21 NOTE — Progress Notes (Signed)
Inpatient Diabetes Program Recommendations  AACE/ADA: New Consensus Statement on Inpatient Glycemic Control (2015)  Target Ranges:  Prepandial:   less than 140 mg/dL      Peak postprandial:   less than 180 mg/dL (1-2 hours)      Critically ill patients:  140 - 180 mg/dL   Lab Results  Component Value Date   GLUCAP 131 (H) 02/21/2020   HGBA1C 9.2 (H) 02/21/2020    Review of Glycemic Control  Diabetes history: DM 2 Outpatient Diabetes medications: Levemir 54 units qhs, Amaryl 4 mg Daily, Metformin 750 mg bid Current orders for Inpatient glycemic control:  Levemir 54 units qhs, Novolog 0-15 units tid  Spoke with pt at bedside regarding glucose control and A1c. Pt sees Dr. Nehemiah Settle outpatient for DM control and has worked closely with him. Dr. Nehemiah Settle titrating medications and she was just recently taken off of SGLT-2 as she could not tolerate it via kidney function and lethargy.  PT sees an Endocrinologist for her thyroid and would like to see them for her DM. She will call Dr. Nehemiah Settle for referral for DM with Endocrinology. Discussed importance of glucose control. 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 Pt very involved with her care.   Thanks,  Christena Deem RN, MSN, BC-ADM Inpatient Diabetes Coordinator Team Pager [phone] (8a-5p)

## 2020-02-21 NOTE — Progress Notes (Signed)
Lower extremity venous has been completed.   Preliminary results in CV Proc.   Blanch Media 02/21/2020 9:02 AM

## 2020-02-21 NOTE — Progress Notes (Signed)
PROGRESS NOTE    Catherine Bautista  BSW:[id number] DOB: 06-Apr-1951 DOA: 02/20/2020 PCP: Renford Dills, MD   Brief Narrative:  HPI On 02/20/2020 by Dr. Darreld Mclean Catherine Bautista is a 69 y.o. female with medical history significant for multiple CVAs/cryptogenic strokes, IDT2DM, HTN, HLD, Graves' disease, and OSA on CPAP who presents to the ED for evaluation of dysarthria.  Patient states she was in her usual state of health until around 10 AM this morning (02/20/2020) when she began to have word finding difficulty and slurred speech.  She says she was try to speak to her husband but was having difficulty getting the right words out.  She knew what she wanted to say but was unable to.  She eventually called a relative and was able to explain she was feeling unwell.  Her relative called EMS.  Patient otherwise denies any focal weakness, change in sensation, difficulty with ambulation, chest pain, dyspnea, nausea, vomiting, change in vision, abdominal pain, dysuria.  She has been on aspirin 81 mg daily.  She has a history of multiple prior strokes with significant work-up and no obvious etiology.  She has a known PFO on prior TEE.  She has had a loop recorder in place without evidence of arrhythmia.  Assessment & Plan   Acute CVA  -Left frontal lobe -Patient with history of cryptogenic stroke and multiple CVAs in the past.  Has known PFO without previous evidence of arrhythmia or clot. -Presented with transient dysarthria and expressive aphasia which have not resolved -No evidence of acute intracranial abnormality. -MRI brain showed small acute infarct of the left frontal lobe involving the primary motor cortex.  Corresponding susceptibility reflecting petechial hemorrhage -MRA head showed similar bilateral ICA stenosis.  No proximal intracranial vessel occlusion. -LDL 87, hemoglobin A1c 9.3 -Echocardiogram and carotid Doppler pending -Lower extremity Doppler showed no evidence of DVT or superficial  thrombosis in the lower extremities bilaterally -Neurology consulted and appreciated -Continue aspirin, patient loaded with Plavix 300 mg once followed by 75 mg daily per neurology -Continue statin -PT, OT, speech consulted  Acute kidney injury -Creatinine 1.80, baseline creatinine approximately 1.0-1.3 -Continue IV fluids and monitor BMP  Essential hypertension -Home medications of ramipril and HCTZ currently held to allow permissive hypertension given acute CVA  Hyperlipidemia -LDL 87 -Continue atorvastatin, may need to increase dose however will wait for neurology recommendations  Diabetes mellitus, type II, uncontrolled -Hemoglobin A1c 9.3 -Continue Levemir, insulin sliding scale and CBG monitoring -Metformin and Amaryl currently held  Graves disease -Patient was previously on methimazole which has been tapered off -Patient to follow-up with PCP and endocrinology  Obstructive sleep apnea -Use CPAP nightly  DVT Prophylaxis Heparin  Code Status: Full  Family Communication: None at bedside  Disposition Plan:  Status is: Inpatient  Remains inpatient appropriate because:Ongoing diagnostic testing needed not appropriate for outpatient work up   Dispo: The patient is from: Home              Anticipated d/c is to: Home              Anticipated d/c date is: 1 day              Patient currently is not medically stable to d/c.   Consultants Neurology  Procedures  None  Antibiotics   Anti-infectives (From admission, onward)   None      Subjective:   Ellysia Bautista seen and examined today.  Patient states that she noted she had a difficult  time finding her words and getting them out prior to admission.  Currently she feels that she has back to her baseline.  She denies current chest pain or shortness of breath, abdominal pain, nausea or vomiting, dizziness or headache.  States she has been trying to change her diet  and was recently started on Jardiance.   Objective:    Vitals:   02/21/20 0200 02/21/20 0300 02/21/20 0516 02/21/20 0518  BP: (!) 130/91 (!) 139/83 (!) 143/66   Pulse: 61 55  56  Resp: 14 18    Temp:      TempSrc:      SpO2: 96% 99%  100%  Weight:      Height:        Intake/Output Summary (Last 24 hours) at 02/21/2020 0912 Last data filed at 02/20/2020 2207 Gross per 24 hour  Intake 500 ml  Output --  Net 500 ml   Filed Weights   02/20/20 1100  Weight: (!) 129 kg    Exam  General: Well developed, well nourished, NAD, appears stated age  HEENT: NCAT, mucous membranes moist.   Cardiovascular: S1 S2 auscultated, no rubs, murmurs or gallops. Regular rate and rhythm.  Respiratory: Clear to auscultation bilaterally with equal chest rise  Abdomen: Soft, obese, nontender, nondistended, + bowel sounds  Extremities: warm dry without cyanosis clubbing or edema  Neuro: AAOx3, nonfocal  Psych: Pleasant, appropriate mood and affect   Data Reviewed: I have personally reviewed following labs and imaging studies  CBC: Recent Labs  Lab 02/20/20 1139 02/20/20 1141  WBC 6.7  --   NEUTROABS 3.8  --   HGB 11.6* 11.9*  HCT 37.4 35.0*  MCV 90.3  --   PLT 303  --    Basic Metabolic Panel: Recent Labs  Lab 02/20/20 1139 02/20/20 1141  NA 136 137  K 4.2 4.0  CL 104 105  CO2 22  --   GLUCOSE 187* 185*  BUN 30* 32*  CREATININE 1.67* 1.80*  CALCIUM 8.7*  --    GFR: Estimated Creatinine Clearance: 41.7 mL/min (A) (by C-G formula based on SCr of 1.8 mg/dL (H)). Liver Function Tests: Recent Labs  Lab 02/20/20 1139  AST 18  ALT 23  ALKPHOS 95  BILITOT 0.9  PROT 6.6  ALBUMIN 3.4*   No results for input(s): LIPASE, AMYLASE in the last 168 hours. Recent Labs  Lab 02/20/20 1240  AMMONIA 15   Coagulation Profile: Recent Labs  Lab 02/20/20 1139  INR 1.0   Cardiac Enzymes: No results for input(s): CKTOTAL, CKMB, CKMBINDEX, TROPONINI in the last 168 hours. BNP (last 3 results) No results for input(s): PROBNP in  the last 8760 hours. HbA1C: Recent Labs    02/20/20 1240 02/21/20 0502  HGBA1C 9.3* 9.2*   CBG: Recent Labs  Lab 02/20/20 1135 02/20/20 1557 02/20/20 2200 02/21/20 0819  GLUCAP 178* 120* 265* 116*   Lipid Profile: Recent Labs    02/21/20 0502  CHOL 140  HDL 43  LDLCALC 87  TRIG 50  CHOLHDL 3.3   Thyroid Function Tests: Recent Labs    02/20/20 2131  TSH 1.134   Anemia Panel: No results for input(s): VITAMINB12, FOLATE, FERRITIN, TIBC, IRON, RETICCTPCT in the last 72 hours. Urine analysis:    Component Value Date/Time   COLORURINE YELLOW 02/20/2020 1710   APPEARANCEUR CLEAR 02/20/2020 1710   LABSPEC 1.008 02/20/2020 1710   PHURINE 6.0 02/20/2020 1710   GLUCOSEU >=500 (A) 02/20/2020 1710   HGBUR NEGATIVE 02/20/2020  1710   BILIRUBINUR NEGATIVE 02/20/2020 1710   KETONESUR NEGATIVE 02/20/2020 1710   PROTEINUR NEGATIVE 02/20/2020 1710   NITRITE NEGATIVE 02/20/2020 1710   LEUKOCYTESUR NEGATIVE 02/20/2020 1710   Sepsis Labs: @LABRCNTIP (procalcitonin:4,lacticidven:4)  ) Recent Results (from the past 240 hour(s))  SARS Coronavirus 2 by RT PCR (hospital order, [facility]) Nasopharyngeal Nasopharyngeal Swab     Status: None   Collection Time: 02/20/20 12:40 PM   Specimen: Nasopharyngeal Swab  Result Value Ref Range Status   SARS Coronavirus 2 NEGATIVE NEGATIVE Final    Comment: (NOTE) SARS-CoV-2 target nucleic acids are NOT DETECTED.  The SARS-CoV-2 RNA is generally detectable in upper and lower respiratory specimens during the acute phase of infection. The lowest concentration of SARS-CoV-2 viral copies this assay can detect is 250 copies / mL. A negative result does not preclude SARS-CoV-2 infection and should not be used as the sole basis for treatment or other patient management decisions.  A negative result may occur with improper specimen collection / handling, submission of specimen other than nasopharyngeal swab, presence of  viral mutation(s) within the areas targeted by this assay, and inadequate number of viral copies (<250 copies / mL). A negative result must be combined with clinical observations, patient history, and epidemiological information.  Fact Sheet for Patients:   BoilerBrush.com.cyhttps://www.fda.gov/media/136312/download  Fact Sheet for Healthcare Providers: https://pope.com/https://www.fda.gov/media/136313/download  This test is not yet approved or  cleared by the [place] FDA and has been authorized for detection and/or diagnosis of SARS-CoV-2 by FDA under an Emergency Use Authorization (EUA).  This EUA will remain in effect (meaning this test can be used) for the duration of the COVID-19 declaration under Section 564(b)(1) of the Act, 21 U.S.C. section 360bbb-3(b)(1), unless the authorization is terminated or revoked sooner.  Performed at [facility], [address] [place], [place] [address]       Radiology Studies: MR ANGIO HEAD WO CONTRAST  Result Date: 02/20/2020 CLINICAL DATA:  Code stroke follow-up EXAM: MRI HEAD WITHOUT CONTRAST MRA HEAD WITHOUT CONTRAST TECHNIQUE: Multiplanar, multiecho pulse sequences of the brain and surrounding structures were obtained without intravenous contrast. Angiographic images of the head were obtained using MRA technique without contrast. COMPARISON:  08/21/2019 MRI head, 01/08/2019 MRA head FINDINGS: MRI HEAD Brain: There is small area of cortical/subcortical reduced diffusion along the left precentral gyrus at the lateral aspect of the hand motor region. There is some corresponding susceptibility likely reflecting petechial hemorrhage. Chronic left MCA territory infarction centered in the left temporal lobe. Chronic left frontoparietal cortical infarcts. Chronic left thalamic infarct. Minimal additional foci of T2 hyperintensity in the supratentorial white matter likely reflect minor chronic microvascular ischemic changes. There is no intracranial mass or mass effect. There  is no hydrocephalus or extra-axial fluid collection. Ventricles are stable in size. Vascular: Major vessel flow voids at the skull base are preserved. Skull and upper cervical spine: Normal marrow signal is preserved. Sinuses/Orbits: Paranasal sinuses are aerated. Orbits are unremarkable. Other: Sella is unremarkable.  Mastoid air cells are clear. MRA HEAD Intracranial internal carotid arteries are patent with atherosclerotic irregularity and moderate stenoses along cavernous and paraclinoid portions. Appearance is similar to the prior study. Middle and anterior cerebral arteries are patent. Intracranial vertebral arteries, basilar artery, posterior cerebral arteries are patent. There is no aneurysm. IMPRESSION: Small acute infarct of the left frontal lobe involving the primary motor cortex. Corresponding susceptibility reflecting petechial hemorrhage. No proximal intracranial vessel occlusion. Similar bilateral ICA stenoses. Stable additional  chronic findings detailed above. Electronically Signed   By: Guadlupe Spanish M.D.   On: 02/20/2020 18:45   MR BRAIN WO CONTRAST  Result Date: 02/20/2020 CLINICAL DATA:  Code stroke follow-up EXAM: MRI HEAD WITHOUT CONTRAST MRA HEAD WITHOUT CONTRAST TECHNIQUE: Multiplanar, multiecho pulse sequences of the brain and surrounding structures were obtained without intravenous contrast. Angiographic images of the head were obtained using MRA technique without contrast. COMPARISON:  08/21/2019 MRI head, 01/08/2019 MRA head FINDINGS: MRI HEAD Brain: There is small area of cortical/subcortical reduced diffusion along the left precentral gyrus at the lateral aspect of the hand motor region. There is some corresponding susceptibility likely reflecting petechial hemorrhage. Chronic left MCA territory infarction centered in the left temporal lobe. Chronic left frontoparietal cortical infarcts. Chronic left thalamic infarct. Minimal additional foci of T2 hyperintensity in the  supratentorial white matter likely reflect minor chronic microvascular ischemic changes. There is no intracranial mass or mass effect. There is no hydrocephalus or extra-axial fluid collection. Ventricles are stable in size. Vascular: Major vessel flow voids at the skull base are preserved. Skull and upper cervical spine: Normal marrow signal is preserved. Sinuses/Orbits: Paranasal sinuses are aerated. Orbits are unremarkable. Other: Sella is unremarkable.  Mastoid air cells are clear. MRA HEAD Intracranial internal carotid arteries are patent with atherosclerotic irregularity and moderate stenoses along cavernous and paraclinoid portions. Appearance is similar to the prior study. Middle and anterior cerebral arteries are patent. Intracranial vertebral arteries, basilar artery, posterior cerebral arteries are patent. There is no aneurysm. IMPRESSION: Small acute infarct of the left frontal lobe involving the primary motor cortex. Corresponding susceptibility reflecting petechial hemorrhage. No proximal intracranial vessel occlusion. Similar bilateral ICA stenoses. Stable additional chronic findings detailed above. Electronically Signed   By: Guadlupe Spanish M.D.   On: 02/20/2020 18:45   CT HEAD CODE STROKE WO CONTRAST  Result Date: 02/20/2020 CLINICAL DATA:  Code stroke.  Prior CVA, slurred speech and aphasia. EXAM: CT HEAD WITHOUT CONTRAST TECHNIQUE: Contiguous axial images were obtained from the base of the skull through the vertex without intravenous contrast. COMPARISON:  Brain MRI 08/21/2019. FINDINGS: Brain: Stable, mild generalized parenchymal atrophy. Redemonstrated remote left MCA territory cortical/subcortical infarct affecting portions of the left temporal, parietal and occipital lobes as well as posterior left insula. Additional known small chronic cortically based infarcts within the left parietal and frontal lobes as previously demonstrated on the MRI of 08/21/2019. Redemonstrated chronic left  thalamic lacunar infarct. There is no evidence of acute intracranial hemorrhage. No acute demarcated cortical infarction is identified. No extra-axial fluid collection. No evidence of intracranial mass. No midline shift. Vascular: No hyperdense vessel.  Atherosclerotic calcifications. Skull: Normal. Negative for fracture or focal lesion. Sinuses/Orbits: Visualized orbits show no acute finding. No significant paranasal sinus disease or mastoid effusion at the imaged levels. ASPECTS [facility] Stroke Program Early CT Score) - Ganglionic level infarction (caudate, lentiform nuclei, internal capsule, insula, M1-M3 cortex): 7 - Supraganglionic infarction (M4-M6 cortex): 3 Total score (0-10 with 10 being normal): 10 (when discounting chronic infarction changes). These results were communicated to Dr. Laurence Slate At 11:56 amon 7/22/2021by text page via the [facility] messaging system. IMPRESSION: No CT evidence of acute intracranial abnormality. ASPECTS is 10. Redemonstrated chronic cortical/subcortical left MCA infarct affecting portions of the left temporal, parietal and occipital lobes as well as posterior left insula. Additional small chronic cortical infarcts within the left parietal and frontal lobes, unchanged. Redemonstrated chronic left thalamic lacunar infarct. Stable, mild generalized parenchymal atrophy. Electronically Signed   By: Ronaldo Miyamoto  Golden DO   On: 02/20/2020 11:57   VAS [place] LOWER EXTREMITY VENOUS (DVT)  Result Date: 02/21/2020  Lower Venous DVTStudy Indications: Stroke.  Limitations: Pain tolerance. Comparison Study: 01/04/16 previous Performing Technologist: Blanch Media RVS  Examination Guidelines: A complete evaluation includes B-mode imaging, spectral Doppler, color Doppler, and power Doppler as needed of all accessible portions of each vessel. Bilateral testing is considered an integral part of a complete examination. Limited examinations for reoccurring indications may be performed as noted. The reflux portion  of the exam is performed with the patient in reverse Trendelenburg.  +---------+---------------+---------+-----------+----------+-------------------+ RIGHT    CompressibilityPhasicitySpontaneityPropertiesThrombus Aging      +---------+---------------+---------+-----------+----------+-------------------+ CFV      Full           Yes      Yes                                      +---------+---------------+---------+-----------+----------+-------------------+ SFJ      Full                                                             +---------+---------------+---------+-----------+----------+-------------------+ FV Prox                 Yes      Yes                  unable to compress                                                        due to patient pain                                                       tolerance           +---------+---------------+---------+-----------+----------+-------------------+ FV Mid                  Yes      Yes                  unable to compress                                                        due to patient pain                                                       tolerance           +---------+---------------+---------+-----------+----------+-------------------+ FV DistalFull                                                             +---------+---------------+---------+-----------+----------+-------------------+  PFV      Full                                                             +---------+---------------+---------+-----------+----------+-------------------+ POP      Full           Yes      Yes                                      +---------+---------------+---------+-----------+----------+-------------------+ PTV      Full                                                             +---------+---------------+---------+-----------+----------+-------------------+ PERO     Full                                                              +---------+---------------+---------+-----------+----------+-------------------+   +---------+---------------+---------+-----------+----------+-------------------+ LEFT     CompressibilityPhasicitySpontaneityPropertiesThrombus Aging      +---------+---------------+---------+-----------+----------+-------------------+ CFV      Full           Yes      Yes                                      +---------+---------------+---------+-----------+----------+-------------------+ SFJ      Full                                                             +---------+---------------+---------+-----------+----------+-------------------+ FV Prox  Full                                                             +---------+---------------+---------+-----------+----------+-------------------+ FV Mid                  Yes      Yes                  unable to compress                                                        due to patient pain  tolerance           +---------+---------------+---------+-----------+----------+-------------------+ FV Distal               Yes      Yes                  unable to compress                                                        due to patient pain                                                       tolerance           +---------+---------------+---------+-----------+----------+-------------------+ PFV      Full                                                             +---------+---------------+---------+-----------+----------+-------------------+ POP      Full           Yes      Yes                                      +---------+---------------+---------+-----------+----------+-------------------+ PTV      Full                                                              +---------+---------------+---------+-----------+----------+-------------------+ PERO     Full                                                             +---------+---------------+---------+-----------+----------+-------------------+    Summary: BILATERAL: - No evidence of deep vein thrombosis seen in the lower extremities, bilaterally. - No evidence of superficial venous thrombosis in the lower extremities, bilaterally. -   *See table(s) above for measurements and observations.    Preliminary      Scheduled Meds: . aspirin EC  81 mg Oral Daily  . atorvastatin  40 mg Oral QHS  . clopidogrel  75 mg Oral Daily  . heparin  5,000 Units Subcutaneous Q8H  . insulin aspart  0-15 Units Subcutaneous TID WC  . insulin detemir  54 Units Subcutaneous QHS   Continuous Infusions:   LOS: 1 day   Time Spent in minutes   45 minutes  Lakayla Barrington D.O. on 02/21/2020 at 9:12 AM  Between 7am to 7pm - Please see pager noted on [email or web address]  After 7pm go to [email or web address]  And look for the night  coverage person covering for me after hours  [facility] Office  [phone]  

## 2020-02-21 NOTE — Evaluation (Signed)
Physical Therapy Evaluation Patient Details Name: Catherine Bautista MRN: [id number] DOB: 12-05-1950 Today's Date: 02/21/2020   History of Present Illness  Pt is a 69 y/o female admitted secondary to difficulty speaking. Found to have L frontal lobe infarct. PMH includes HTN, DM, CVA, graves disease, and OSA on CPAP.   Clinical Impression  Pt admitted secondary to problem above with deficits below. Pt requiring min guard A for gait when performing dynamic gait tasks, otherwise requiring supervision. Pt reports she felt as if she drifted to the R during ambulation. Also reports R grip strength is weaker and that she had difficulty writing with her R hand. Anticipate pt will progress well and will not require follow up PT. Will continue to follow acutely.     Follow Up Recommendations No PT follow up    Equipment Recommendations  None recommended by PT    Recommendations for Other Services       Precautions / Restrictions Precautions Precautions: None Restrictions Weight Bearing Restrictions: No      Mobility  Bed Mobility Overal bed mobility: Modified Independent                Transfers Overall transfer level: Needs assistance Equipment used: None Transfers: Sit to/from Stand Sit to Stand: Supervision         General transfer comment: Supervision for safety.   Ambulation/Gait Ambulation/Gait assistance: Supervision;Min guard Gait Distance (Feet): 100 Feet Assistive device: None Gait Pattern/deviations: Drifts right/left;Decreased stride length;Step-through pattern Gait velocity: Decreased   General Gait Details: Mild unsteadiness and pt reports feeling as if she was drifting to the R. Guarded when performing dynamic gait tasks. Reports some lightheadedness following horizontal head turns.   Stairs            Wheelchair Mobility    Modified Rankin (Stroke Patients Only) Modified Rankin (Stroke Patients Only) Pre-Morbid Rankin Score: No symptoms Modified  Rankin: Moderately severe disability     Balance Overall balance assessment: Needs assistance Sitting-balance support: Feet supported;No upper extremity supported Sitting balance-Leahy Scale: Good     Standing balance support: No upper extremity supported;During functional activity Standing balance-Leahy Scale: Fair                   Standardized Balance Assessment Standardized Balance Assessment : Dynamic Gait Index   Dynamic Gait Index Level Surface: Mild Impairment Gait with Horizontal Head Turns: Mild Impairment Gait with Vertical Head Turns: Mild Impairment Gait and Pivot Turn: Mild Impairment       Pertinent Vitals/Pain Pain Assessment: No/denies pain    Home Living Family/patient expects to be discharged to:: Private residence Living Arrangements: Spouse/significant other Available Help at Discharge: Family Type of Home: House Home Access: Stairs to enter Entrance Stairs-Rails: None Entrance Stairs-Number of Steps: 2 Home Layout: Two level;Laundry or work area in [employer] Equipment: Production assistant, radio - single point;Walker - 2 wheels;Walker - 4 wheels Additional Comments: Husbands caregiver     Prior Function Level of Independence: Independent with assistive device(s)         Comments: 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 Uses cane if her back is hurting     Hand Dominance   Dominant Hand: Right    Extremity/Trunk Assessment   Upper Extremity Assessment Upper Extremity Assessment: Defer to OT evaluation;RUE deficits/detail RUE Deficits / Details: Did note weakened R grip strength and pt reports difficulty writing.     Lower Extremity Assessment Lower Extremity Assessment: Overall WFL for tasks assessed    Cervical /  Trunk Assessment Cervical / Trunk Assessment: Normal  Communication   Communication: No difficulties  Cognition Arousal/Alertness: Awake/alert Behavior During Therapy: WFL for tasks assessed/performed Overall Cognitive  Status: Within Functional Limits for tasks assessed                                        General Comments      Exercises     Assessment/Plan    PT Assessment Patient needs continued PT services  PT Problem List Decreased strength;Decreased balance;Decreased mobility       PT Treatment Interventions Gait training;Stair training;Functional mobility training;Therapeutic activities;Therapeutic exercise;Balance training;Patient/family education    PT Goals (Current goals can be found in the Care Plan section)  Acute Rehab PT Goals Patient Stated Goal: to go home PT Goal Formulation: With patient Time For Goal Achievement: 03/06/20 Potential to Achieve Goals: Good    Frequency Min 4X/week   Barriers to discharge        Co-evaluation               AM-PAC PT "6 Clicks" Mobility  Outcome Measure Help needed turning from your back to your side while in a flat bed without using bedrails?: None Help needed moving from lying on your back to sitting on the side of a flat bed without using bedrails?: None Help needed moving to and from a bed to a chair (including a wheelchair)?: A Little Help needed standing up from a chair using your arms (e.g., wheelchair or bedside chair)?: None Help needed to walk in hospital room?: A Little Help needed climbing 3-5 steps with a railing? : A Little 6 Click Score: 21    End of Session   Activity Tolerance: Patient tolerated treatment well Patient left: in bed;with call bell/phone within reach Nurse Communication: Mobility status PT Visit Diagnosis: Other abnormalities of gait and mobility (R26.89);Other symptoms and signs involving the nervous system (J57.017)    Time: 7939-0300 PT Time Calculation (min) (ACUTE ONLY): 20 min   Charges:   PT Evaluation $PT Eval Low Complexity: 1 Low          Cindee Salt, DPT  Acute Rehabilitation Services  Pager: [phone] Office: [phone]   Lehman Prom 02/21/2020, 5:54 PM

## 2020-02-21 NOTE — ED Notes (Signed)
Patient up walking to bathroom gait steady 

## 2020-02-21 NOTE — ED Notes (Signed)
Breakfast tray ordered 

## 2020-02-22 LAB — GLUCOSE, CAPILLARY
Glucose-Capillary: 138 mg/dL — ABNORMAL HIGH (ref 70–99)
Glucose-Capillary: 220 mg/dL — ABNORMAL HIGH (ref 70–99)
Glucose-Capillary: 209 mg/dL — ABNORMAL HIGH (ref 70–99)

## 2020-02-22 MED ORDER — ATORVASTATIN CALCIUM 80 MG PO TABS: 80.0000 mg | ORAL_TABLET | Freq: Every day | ORAL | 1 refills | Status: AC

## 2020-02-22 NOTE — Evaluation (Signed)
Speech Language Pathology Evaluation Patient Details Name: OWEN PAGNOTTA MRN: [id number] DOB: [birth year] Today's Date: 02/22/2020 Time: 1140-1230 SLP Time Calculation (min) (ACUTE ONLY): 50 min  Problem List:  Patient Active Problem List   Diagnosis Date Noted  . Acute cerebrovascular accident (CVA) (HCC) 02/20/2020  . Graves disease   . Right hand weakness   . AKI (acute kidney injury) (HCC) 01/07/2019  . Hyperthyroidism 06/08/2017  . History of stroke 03/23/2016  . Type 2 diabetes mellitus with hyperglycemia, with long-term current use of insulin (HCC)   . Essential hypertension   . Cerebrovascular accident (CVA) due to embolism of left middle cerebral artery (HCC)   . Acute CVA (cerebrovascular accident) (HCC) 01/03/2016  . Hyperlipidemia associated with type 2 diabetes mellitus (HCC)   . Hypertension associated with diabetes (HCC)   . Stroke (HCC)   . Anemia   . Depression with anxiety   . Right arm weakness   . Sleep apnea 01/31/2012  . Anemia, unspecified 05/21/2011   Past Medical History:  Past Medical History:  Diagnosis Date  . Anemia   . Anemia, vitamin B12 deficiency   . Depression with anxiety   . Diabetes mellitus   . Dyslipidemia   . Fibromyalgia   . Graves disease   . Hypertension   . Obesity   . Stroke (HCC)    "I've had 3 strokes"   Past Surgical History:  Past Surgical History:  Procedure Laterality Date  . ABDOMINAL HYSTERECTOMY  2007  . CHOLECYSTECTOMY     laproscopic  . EP IMPLANTABLE DEVICE N/A 01/05/2016   Procedure: Loop Recorder Insertion;  Surgeon: Will Jorja Loa, MD;  Location: [facility] INVASIVE CV LAB;  Service: Cardiovascular;  Laterality: N/A;  . GASTRIC BYPASS  1981  . TEE WITHOUT CARDIOVERSION N/A 01/05/2016   Procedure: TRANSESOPHAGEAL ECHOCARDIOGRAM (TEE);  Surgeon: Quintella Reichert, MD;  Location: [facility] ENDOSCOPY;  Service: Cardiovascular;  Laterality: N/A;  . TEE WITHOUT CARDIOVERSION N/A 04/18/2017   Procedure: TRANSESOPHAGEAL  ECHOCARDIOGRAM (TEE);  Surgeon: Lewayne Bunting, MD;  Location: [facility] ENDOSCOPY;  Service: Cardiovascular;  Laterality: N/A;  . therapuetic abortion  1986   associated with C-section   HPI:  Pt is a 69 y/o female admitted secondary to difficulty speaking. Found to have L frontal lobe infarct. PMH includes HTN, DM, multiple CVA's, graves disease, and OSA on CPAP. She has prior history of multiple cryptogenic strokes  cryptogenic strokes in left MCA in 2002, right frontal in 2011, left frontal cortical infarct 2017 and most recently 12/2018 with left frontal MCA branch infarct.   Assessment / Plan / Recommendation Clinical Impression  Patient presents at likely baseline for her cognitive-linguistic and speech function. She reported to SLP that she has had multiple CVA's and did receive speech-language therapy following the first CVA back in 2002. She reported that she never fully recovered with her expressive language and although she is able to have conversations and received her PhD in Cultural studies and Masters in [employer], she "has to work harder". Patient described all the compensatory strategies she utilizes. Her expressive language impairment was very minimal with SLP only observing intermittent halting of speech during conversation and infrequent instances of patient with delay in word finding at conversational level. As patient appears to be at baseline level of functioning, and is modified independent with all cognitive-linguistic and speech abilities, no therapy indicated at this time. SLP recommended patient work to reduce the stressors in her life, focus on self-care more and to  set aside time to relax as well as manage health to decrease risk of another CVA.    SLP Assessment  SLP Recommendation/Assessment: Patient does not need any further Speech Lanaguage Pathology Services SLP Visit Diagnosis: Aphasia (R47.01)    Follow Up Recommendations  None    Frequency and Duration       N/A     SLP Evaluation Cognition  Overall Cognitive Status: Within Functional Limits for tasks assessed       Comprehension  Auditory Comprehension Overall Auditory Comprehension: Appears within functional limits for tasks assessed    Expression Expression Primary Mode of Expression: Verbal Verbal Expression Overall Verbal Expression: Other (comment) (appeared with some intermittent instances of halting speech at conversational level as well as intermittent brief instances of delay in word retrieval at conversational level.) Repetition: No impairment Naming: No impairment Pragmatics: No impairment Written Expression Dominant Hand: Right Written Expression: Not tested   Oral / Motor  Oral Motor/Sensory Function Overall Oral Motor/Sensory Function: Within functional limits   GO                   Angela Nevin, MA, CCC-SLP Speech Therapy [facility] Acute Rehab

## 2020-02-22 NOTE — Discharge Summary (Addendum)
.  Physician Discharge Summary  Catherine Bautista:[id number] DOB: 12/06/1950 DOA: 02/20/2020  PCP: Renford Dills, MD  Admit date: 02/20/2020 Discharge date: 02/22/2020  Time spent: 45 minutes  Recommendations for Outpatient Follow-up:  Patient will be discharged to home.  Patient will need to follow up with primary care provider within one week of discharge.  Follow up with neurology. Patient should continue medications as prescribed.  Patient should follow a heart healthy/carb modified diet.   Discharge Diagnoses:  Acute CVA  Acute kidney injury Essential hypertension Hyperlipidemia Diabetes mellitus, type II, uncontrolled Graves disease Obstructive sleep apnea  Discharge Condition: Stable  Diet recommendation: Heart healthy/carb modified  Filed Weights   02/20/20 1100  Weight: (!) 129 kg    History of present illness:  On 02/20/2020 by Dr. Erik Obey a 69 y.o.femalewith medical history significant formultiple CVAs/cryptogenic strokes, IDT2DM, HTN, HLD, Graves' disease, and OSAon CPAP who presents to the ED for evaluation of dysarthria.  Patient states she was in her usual state of health until around 10 AM this morning (02/20/2020) when she began to have word finding difficulty and slurred speech. She says she was try to speak to her husband but was having difficulty getting the right words out. She knew what she wanted to say but was unable to. She eventually called arelative and was able to explain she was feeling unwell. Her relative called EMS.  Patient otherwise denies any focal weakness, change in sensation, difficulty with ambulation, chest pain, dyspnea, nausea, vomiting, change in vision, abdominal pain, dysuria.  She has been on aspirin 81 mg daily. She has a history of multiple prior strokes with significant work-up and no obvious etiology. She has a known PFO on prior TEE. She has had a loop recorder in place without evidence of  arrhythmia.  Hospital Course:  Acute CVA  -Left frontal lobe -Patient with history of cryptogenic stroke and multiple CVAs in the past.  Has known PFO without previous evidence of arrhythmia or clot. -Presented with transient dysarthria and expressive aphasia which have resolved -No evidence of acute intracranial abnormality. -MRI brain showed small acute infarct of the left frontal lobe involving the primary motor cortex.  Corresponding susceptibility reflecting petechial hemorrhage -MRA head showed similar bilateral ICA stenosis.  No proximal intracranial vessel occlusion. -LDL 87, hemoglobin A1c 9.3 -Echocardiogram/Bubble study: EF 60-65%, LV diastolic parameters indeterminate. Septal bounce in diastole. No apparent intra-arterial shunt. No intracardiac source of embolus detected.  -Carotid Doppler: Right ICA stenosis 1 to 39%. Nonhemodynamically significant plaque<50% in CCA. Left ICA stenosis 1 to 39%. -Lower extremity Doppler showed no evidence of DVT or superficial thrombosis in the lower extremities bilaterally -Neurology consulted and appreciated- discussed trial enrollment with patient given repeat CVA.  -Continue aspirin, patient loaded with Plavix 300 mg once followed by 75 mg daily per neurology -Patient decided to enroll in [employer] trial. -Repeat MRI showed small acute infarct of the left precentral gyrus -Continue statin -PT, OT, speech consulted- no follow up was recommended  Acute kidney injury -Creatinine 1.80, baseline creatinine approximately 1.0-1.3 -Continue IV fluids and monitor BMP  Essential hypertension -Home medications of ramipril and HCTZ currently held to allow permissive hypertension given acute CVA  Hyperlipidemia -LDL 87 -Continue atorvastatin, may need to increase dose however will wait for neurology recommendations  Diabetes mellitus, type II, uncontrolled -Hemoglobin A1c 9.3 -Continue Levemir, insulin sliding scale and CBG  monitoring -Metformin and Amaryl currently held- resume on dischage -discussed diet and eating  habbits with patient; she needs better control and should follow up with an endocrinologist for DM as well.   Graves disease -Patient was previously on methimazole which has been tapered off -Patient to follow-up with PCP and endocrinology  Obstructive sleep apnea -Use CPAP nightly  Procedures: Echocardiogram LE doppler Carotid doppler  Consultations: Neurology   Discharge Exam: Vitals:   02/22/20 0852 02/22/20 1142  BP: (!) 167/68 (!) 151/78  Pulse: 80 56  Resp: 20 20  Temp: 98 F (36.7 C) 98.2 F (36.8 C)  SpO2: 98% 100%     General: Well developed, well nourished, NAD, appears stated age  HEENT: NCAT,  mucous membranes moist.  Cardiovascular: S1 S2 auscultated, RRR, no murmur  Respiratory: Clear to auscultation bilaterally  Abdomen: Soft, obese, nontender, nondistended, + bowel sounds  Extremities: warm dry without cyanosis clubbing or edema  Neuro: AAOx3, nonfocal  Psych: Appropriate mood and affect, pleasant  Discharge Instructions Discharge Instructions    Ambulatory referral to Neurology   Complete by: As directed    Follow up with Dr. Pearlean Brownie at [facility] in 4-6 weeks. Too complicated for RN to follow. Thanks.     Allergies as of 02/22/2020      Reactions   Canagliflozin Other (See Comments)   Decreased renal function, dizziness, and extremely lethargy   Demerol Nausea And Vomiting   Excedrin Extra Strength [aspirin-acetaminophen-caffeine] Other (See Comments)   Weird feeling   Janumet [sitagliptin-metformin Hcl] Other (See Comments)   Bloating and gas   Meperidine Nausea And Vomiting   Morphine And Related Other (See Comments)   "Weird feelings"   Novocain [procaine Hcl] Nausea And Vomiting   Per pt "I never had a problem with lidocaine, just novocain."   Procaine Other (See Comments)   Reaction not noted      Medication List    TAKE these  medications   Accu-Chek SmartView test strip Generic drug: glucose blood USE TO CHECK YOUR BLOOD SUGAR THREE TIMES A DAY DX E13.9 IN VITRO 90 DAYS   atorvastatin 80 MG tablet Commonly known as: LIPITOR Take 1 tablet (80 mg total) by mouth at bedtime. What changed:   medication strength  how much to take  when to take this   b complex vitamins tablet Take 1 tablet by mouth daily. Reported on 01/13/2016   BD Pen Needle Nano 2nd Gen 32G X 4 MM Misc Generic drug: Insulin Pen Needle USE TO TEST BLOOD SUGAR 3 TIMES DAILY   ferrous sulfate 325 (65 FE) MG tablet Take 325 mg by mouth daily with breakfast.   glimepiride 4 MG tablet Commonly known as: AMARYL Take 4 mg by mouth daily with breakfast.   hydrochlorothiazide 25 MG tablet Commonly known as: HYDRODIURIL Take 1 tablet (25 mg total) by mouth daily.   Levemir FlexTouch 100 UNIT/ML FlexPen Generic drug: insulin detemir Inject 54 Units into the skin at bedtime.   Magnesium 300 MG Caps Take 300 mg by mouth daily.   metFORMIN 750 MG 24 hr tablet Commonly known as: GLUCOPHAGE-XR Take 750 mg by mouth 2 (two) times a day.   Omega 3 1200 MG Caps Take 1,200 mg by mouth at bedtime.   PRENATAL PO Take 1 tablet by mouth daily.   ramipril 10 MG capsule Commonly known as: ALTACE Take 10 mg by mouth daily.   vitamin B-12 1000 MCG tablet Commonly known as: CYANOCOBALAMIN Take 1,000 mcg by mouth daily.   vitamin C 500 MG tablet Commonly known as: ASCORBIC ACID  Take 500 mg by mouth daily.   Vitamin D3 50 MCG (2000 UT) Tabs Take 2,000 Units by mouth daily.      Allergies  Allergen Reactions  . Canagliflozin Other (See Comments)    Decreased renal function, dizziness, and extremely lethargy  . Demerol Nausea And Vomiting  . Excedrin Extra Strength [Aspirin-Acetaminophen-Caffeine] Other (See Comments)    Weird feeling  . Janumet [Sitagliptin-Metformin Hcl] Other (See Comments)    Bloating and gas  . Meperidine  Nausea And Vomiting  . Morphine And Related Other (See Comments)    "Weird feelings"  . Novocain [Procaine Hcl] Nausea And Vomiting    Per pt "I never had a problem with lidocaine, just novocain."  . Procaine Other (See Comments)    Reaction not noted    Follow-up Information    Micki Riley, MD. Schedule an appointment as soon as possible for a visit in 4 week(s).   Specialties: Neurology, Radiology Contact information: [address] [place] [address] [phone]        Julieanne Cotton, MD. Schedule an appointment as soon as possible for a visit in 1 week(s).   Specialties: Interventional Radiology, Radiology Why: Aneurysm follow up Contact information: [address] [place] [address] [phone]        Renford Dills, MD. Schedule an appointment as soon as possible for a visit in 1 week(s).   Specialty: Internal Medicine Why: follow up Contact information: [address] [place] [address] [phone]                The results of significant diagnostics from this hospitalization (including imaging, microbiology, ancillary and laboratory) are listed below for reference.    Significant Diagnostic Studies: MR ANGIO HEAD WO CONTRAST  Result Date: 02/20/2020 CLINICAL DATA:  Code stroke follow-up EXAM: MRI HEAD WITHOUT CONTRAST MRA HEAD WITHOUT CONTRAST TECHNIQUE: Multiplanar, multiecho pulse sequences of the brain and surrounding structures were obtained without intravenous contrast. Angiographic images of the head were obtained using MRA technique without contrast. COMPARISON:  08/21/2019 MRI head, 01/08/2019 MRA head FINDINGS: MRI HEAD Brain: There is small area of cortical/subcortical reduced diffusion along the left precentral gyrus at the lateral aspect of the hand motor region. There is some corresponding susceptibility likely reflecting petechial hemorrhage. Chronic left MCA territory infarction centered in the  left temporal lobe. Chronic left frontoparietal cortical infarcts. Chronic left thalamic infarct. Minimal additional foci of T2 hyperintensity in the supratentorial white matter likely reflect minor chronic microvascular ischemic changes. There is no intracranial mass or mass effect. There is no hydrocephalus or extra-axial fluid collection. Ventricles are stable in size. Vascular: Major vessel flow voids at the skull base are preserved. Skull and upper cervical spine: Normal marrow signal is preserved. Sinuses/Orbits: Paranasal sinuses are aerated. Orbits are unremarkable. Other: Sella is unremarkable.  Mastoid air cells are clear. MRA HEAD Intracranial internal carotid arteries are patent with atherosclerotic irregularity and moderate stenoses along cavernous and paraclinoid portions. Appearance is similar to the prior study. Middle and anterior cerebral arteries are patent. Intracranial vertebral arteries, basilar artery, posterior cerebral arteries are patent. There is no aneurysm. IMPRESSION: Small acute infarct of the left frontal lobe involving the primary motor cortex. Corresponding susceptibility reflecting petechial hemorrhage. No proximal intracranial vessel occlusion. Similar bilateral ICA stenoses. Stable additional chronic findings detailed above. Electronically Signed   By: Guadlupe Spanish M.D.   On: 02/20/2020 18:45   MR BRAIN WO CONTRAST  Result Date: 02/22/2020  CLINICAL DATA:  Stroke follow-up EXAM: MRI HEAD WITHOUT CONTRAST TECHNIQUE: Multiplanar, multiecho pulse sequences of the brain and surrounding structures were obtained without intravenous contrast. COMPARISON:  Brain MRI 02/20/2020 FINDINGS: Small acute infarct of the left precentral gyrus. There is an old infarct the left temporal lobe. No acute hemorrhage. There is multifocal periventricular white matter hyperintensity, most often a result of chronic microvascular ischemia. IMPRESSION: Small acute infarct of the left precentral gyrus.  Electronically Signed   By: Deatra Robinson M.D.   On: 02/22/2020 00:15   MR BRAIN WO CONTRAST  Result Date: 02/20/2020 CLINICAL DATA:  Code stroke follow-up EXAM: MRI HEAD WITHOUT CONTRAST MRA HEAD WITHOUT CONTRAST TECHNIQUE: Multiplanar, multiecho pulse sequences of the brain and surrounding structures were obtained without intravenous contrast. Angiographic images of the head were obtained using MRA technique without contrast. COMPARISON:  08/21/2019 MRI head, 01/08/2019 MRA head FINDINGS: MRI HEAD Brain: There is small area of cortical/subcortical reduced diffusion along the left precentral gyrus at the lateral aspect of the hand motor region. There is some corresponding susceptibility likely reflecting petechial hemorrhage. Chronic left MCA territory infarction centered in the left temporal lobe. Chronic left frontoparietal cortical infarcts. Chronic left thalamic infarct. Minimal additional foci of T2 hyperintensity in the supratentorial white matter likely reflect minor chronic microvascular ischemic changes. There is no intracranial mass or mass effect. There is no hydrocephalus or extra-axial fluid collection. Ventricles are stable in size. Vascular: Major vessel flow voids at the skull base are preserved. Skull and upper cervical spine: Normal marrow signal is preserved. Sinuses/Orbits: Paranasal sinuses are aerated. Orbits are unremarkable. Other: Sella is unremarkable.  Mastoid air cells are clear. MRA HEAD Intracranial internal carotid arteries are patent with atherosclerotic irregularity and moderate stenoses along cavernous and paraclinoid portions. Appearance is similar to the prior study. Middle and anterior cerebral arteries are patent. Intracranial vertebral arteries, basilar artery, posterior cerebral arteries are patent. There is no aneurysm. IMPRESSION: Small acute infarct of the left frontal lobe involving the primary motor cortex. Corresponding susceptibility reflecting petechial  hemorrhage. No proximal intracranial vessel occlusion. Similar bilateral ICA stenoses. Stable additional chronic findings detailed above. Electronically Signed   By: Guadlupe Spanish M.D.   On: 02/20/2020 18:45   ECHOCARDIOGRAM COMPLETE BUBBLE STUDY  Result Date: 02/21/2020    ECHOCARDIOGRAM REPORT   Patient Name:   BRADEN DELOACH Smoak  Date of Exam: 02/21/2020 Medical Rec #:  [id number]  Height:       66.0 in Accession #:    [id number] Weight:       284.4 lb Date of Birth:  1951/06/15 BSA:          2.323 m Patient Age:    68 years   BP:           157/73 mmHg Patient Gender: F          HR:           56 bpm. Exam Location:  Inpatient Procedure: 2D Echo and Saline Contrast Bubble Study Indications:    stroke 434.91  History:        Patient has prior history of Echocardiogram examinations, most                 recent 01/08/2019. Stroke.  Sonographer:    Delcie Roch Referring Phys: [id number] VISHAL R PATEL IMPRESSIONS  1. Left ventricular ejection fraction, by estimation, is 60 to 65%. The left ventricle has normal function. The left ventricle has no regional wall motion abnormalities. Left  ventricular diastolic parameters are indeterminate. There is Septal bounce in diastole.  2. Right ventricular systolic function is normal. The right ventricular size is normal. Tricuspid regurgitation signal is inadequate for assessing PA pressure.  3. Left atrial size was mildly dilated.  4. The mitral valve is normal in structure. Trivial mitral valve regurgitation. No evidence of mitral stenosis.  5. The aortic valve is tricuspid. Aortic valve regurgitation is trivial. Mild aortic valve sclerosis is present, with no evidence of aortic valve stenosis.  6. Aortic dilatation noted. There is mild dilatation of the ascending aorta measuring 40 mm.  7. The inferior vena cava is normal in size with greater than 50% respiratory variability, suggesting right atrial pressure of 3 mmHg.  8. No apparent intraatrial shunt, though difficult images.  Comparison(s): No significant change from prior study. Conclusion(s)/Recommendation(s): No intracardiac source of embolism detected on this transthoracic study. A transesophageal echocardiogram is recommended to exclude cardiac source of embolism if clinically indicated. FINDINGS  Left Ventricle: Left ventricular ejection fraction, by estimation, is 60 to 65%. The left ventricle has normal function. The left ventricle has no regional wall motion abnormalities. The left ventricular internal cavity size was normal in size. There is  no left ventricular hypertrophy. Septal bounce in diastole. Left ventricular diastolic parameters are indeterminate. Right Ventricle: The right ventricular size is normal. No increase in right ventricular wall thickness. Right ventricular systolic function is normal. Tricuspid regurgitation signal is inadequate for assessing PA pressure. Left Atrium: Left atrial size was mildly dilated. Right Atrium: Right atrial size was normal in size. Pericardium: Trivial pericardial effusion is present. Mitral Valve: The mitral valve is normal in structure. Mild mitral annular calcification. Trivial mitral valve regurgitation. No evidence of mitral valve stenosis. Tricuspid Valve: The tricuspid valve is normal in structure. Tricuspid valve regurgitation is trivial. No evidence of tricuspid stenosis. Aortic Valve: The aortic valve is tricuspid. Aortic valve regurgitation is trivial. Mild aortic valve sclerosis is present, with no evidence of aortic valve stenosis. There is mild calcification of the aortic valve. Pulmonic Valve: The pulmonic valve was not well visualized. Pulmonic valve regurgitation is trivial. Aorta: Aortic dilatation noted. There is mild dilatation of the ascending aorta measuring 40 mm. Venous: The inferior vena cava is normal in size with greater than 50% respiratory variability, suggesting right atrial pressure of 3 mmHg. IAS/Shunts: The atrial septum is grossly normal. Agitated  saline contrast was given intravenously to evaluate for intracardiac shunting. No apparent intraatrial shunt, though difficult images.  LEFT VENTRICLE PLAX 2D LVIDd:         4.70 cm     Diastology LVIDs:         3.00 cm     LV e' lateral: 7.83 cm/s LV PW:         1.10 cm     LV e' medial:  5.00 cm/s LV IVS:        1.00 cm LVOT diam:     2.20 cm LVOT Area:     3.80 cm  LV Volumes (MOD) LV vol d, MOD A2C: 72.6 ml LV vol d, MOD A4C: 89.4 ml LV vol s, MOD A2C: 35.3 ml LV vol s, MOD A4C: 38.4 ml LV SV MOD A2C:     37.3 ml LV SV MOD A4C:     89.4 ml LV SV MOD BP:      45.9 ml RIGHT VENTRICLE             IVC RV S prime:  12.70 cm/s  IVC diam: 1.70 cm TAPSE (M-mode): 1.7 cm LEFT ATRIUM             Index       RIGHT ATRIUM           Index LA diam:        3.30 cm 1.42 cm/m  RA Area:     14.30 cm LA Vol (A2C):   70.3 ml 30.27 ml/m RA Volume:   36.00 ml  15.50 ml/m LA Vol (A4C):   75.6 ml 32.55 ml/m LA Biplane Vol: 78.3 ml 33.71 ml/m   AORTA Ao Root diam: 3.90 cm Ao Asc diam:  4.00 cm  SHUNTS Systemic Diam: 2.20 cm Jodelle Red MD Electronically signed by Jodelle Red MD Signature Date/Time: 02/21/2020/1:14:38 PM    Final    CT HEAD CODE STROKE WO CONTRAST  Result Date: 02/20/2020 CLINICAL DATA:  Code stroke.  Prior CVA, slurred speech and aphasia. EXAM: CT HEAD WITHOUT CONTRAST TECHNIQUE: Contiguous axial images were obtained from the base of the skull through the vertex without intravenous contrast. COMPARISON:  Brain MRI 08/21/2019. FINDINGS: Brain: Stable, mild generalized parenchymal atrophy. Redemonstrated remote left MCA territory cortical/subcortical infarct affecting portions of the left temporal, parietal and occipital lobes as well as posterior left insula. Additional known small chronic cortically based infarcts within the left parietal and frontal lobes as previously demonstrated on the MRI of 08/21/2019. Redemonstrated chronic left thalamic lacunar infarct. There is no evidence of  acute intracranial hemorrhage. No acute demarcated cortical infarction is identified. No extra-axial fluid collection. No evidence of intracranial mass. No midline shift. Vascular: No hyperdense vessel.  Atherosclerotic calcifications. Skull: Normal. Negative for fracture or focal lesion. Sinuses/Orbits: Visualized orbits show no acute finding. No significant paranasal sinus disease or mastoid effusion at the imaged levels. ASPECTS [facility] Stroke Program Early CT Score) - Ganglionic level infarction (caudate, lentiform nuclei, internal capsule, insula, M1-M3 cortex): 7 - Supraganglionic infarction (M4-M6 cortex): 3 Total score (0-10 with 10 being normal): 10 (when discounting chronic infarction changes). These results were communicated to Dr. Laurence Slate At 11:56 amon 7/22/2021by text page via the [facility] messaging system. IMPRESSION: No CT evidence of acute intracranial abnormality. ASPECTS is 10. Redemonstrated chronic cortical/subcortical left MCA infarct affecting portions of the left temporal, parietal and occipital lobes as well as posterior left insula. Additional small chronic cortical infarcts within the left parietal and frontal lobes, unchanged. Redemonstrated chronic left thalamic lacunar infarct. Stable, mild generalized parenchymal atrophy. Electronically Signed   By: Jackey Loge DO   On: 02/20/2020 11:57   VAS US CAROTID (at [facility] and [facility] only)  Result Date: 02/21/2020 Carotid Arterial Duplex Study Indications:       CVA. Comparison Study:  Prior study 01-04-2016 Rt and LT 1-39% stenosis in ICA, mild                    plaque Performing Technologist: Jean Rosenthal  Examination Guidelines: A complete evaluation includes B-mode imaging, spectral Doppler, color Doppler, and power Doppler as needed of all accessible portions of each vessel. Bilateral testing is considered an integral part of a complete examination. Limited examinations for reoccurring indications may be performed as noted.  Right Carotid  Findings: +----------+--------+--------+--------+-----------------------+--------+           PSV cm/sEDV cm/sStenosisPlaque Description     Comments +----------+--------+--------+--------+-----------------------+--------+ CCA Prox  109     14              heterogenous and smooth         +----------+--------+--------+--------+-----------------------+--------+  CCA Distal111     17              heterogenous and smooth         +----------+--------+--------+--------+-----------------------+--------+ ICA Prox  64      7       1-39%   heterogenous                    +----------+--------+--------+--------+-----------------------+--------+ ICA Distal58      17                                              +----------+--------+--------+--------+-----------------------+--------+ ECA       117                                                     +----------+--------+--------+--------+-----------------------+--------+ +---------+--------+--+--------+--+ VertebralPSV cm/s47EDV cm/s11 +---------+--------+--+--------+--+  Left Carotid Findings: +----------+--------+--------+--------+------------------+------------------+           PSV cm/sEDV cm/sStenosisPlaque DescriptionComments           +----------+--------+--------+--------+------------------+------------------+ CCA Prox  112     13                                intimal thickening +----------+--------+--------+--------+------------------+------------------+ CCA Distal91      14                                intimal thickening +----------+--------+--------+--------+------------------+------------------+ ICA Prox  49      9       1-39%   heterogenous                         +----------+--------+--------+--------+------------------+------------------+ ICA Distal47      8                                                    +----------+--------+--------+--------+------------------+------------------+  ECA       115                                                          +----------+--------+--------+--------+------------------+------------------+ +----------+--------+--------+--------+-------------------+           PSV cm/sEDV cm/sDescribeArm Pressure (mmHG) +----------+--------+--------+--------+-------------------+ [id number]                                         +----------+--------+--------+--------+-------------------+ +---------+--------+--+--------+--+ VertebralPSV cm/s75EDV cm/s12 +---------+--------+--+--------+--+   Summary: Right Carotid: Velocities in the right ICA are consistent with a 1-39% stenosis.                Non-hemodynamically significant plaque <50% noted in the CCA. Left Carotid: Velocities in the left ICA are consistent with a 1-39% stenosis.  *See table(s) above for  measurements and observations.  Electronically signed by Delia Heady MD on 02/21/2020 at 2:19:11 PM.    Final    VAS [place] LOWER EXTREMITY VENOUS (DVT)  Result Date: 02/21/2020  Lower Venous DVTStudy Indications: Stroke.  Limitations: Pain tolerance. Comparison Study: 01/04/16 previous Performing Technologist: Blanch Media RVS  Examination Guidelines: A complete evaluation includes B-mode imaging, spectral Doppler, color Doppler, and power Doppler as needed of all accessible portions of each vessel. Bilateral testing is considered an integral part of a complete examination. Limited examinations for reoccurring indications may be performed as noted. The reflux portion of the exam is performed with the patient in reverse Trendelenburg.  +---------+---------------+---------+-----------+----------+-------------------+ RIGHT    CompressibilityPhasicitySpontaneityPropertiesThrombus Aging      +---------+---------------+---------+-----------+----------+-------------------+ CFV      Full           Yes      Yes                                       +---------+---------------+---------+-----------+----------+-------------------+ SFJ      Full                                                             +---------+---------------+---------+-----------+----------+-------------------+ FV Prox                 Yes      Yes                  unable to compress                                                        due to patient pain                                                       tolerance           +---------+---------------+---------+-----------+----------+-------------------+ FV Mid                  Yes      Yes                  unable to compress                                                        due to patient pain                                                       tolerance           +---------+---------------+---------+-----------+----------+-------------------+ FV DistalFull                                                             +---------+---------------+---------+-----------+----------+-------------------+  PFV      Full                                                             +---------+---------------+---------+-----------+----------+-------------------+ POP      Full           Yes      Yes                                      +---------+---------------+---------+-----------+----------+-------------------+ PTV      Full                                                             +---------+---------------+---------+-----------+----------+-------------------+ PERO     Full                                                             +---------+---------------+---------+-----------+----------+-------------------+   +---------+---------------+---------+-----------+----------+-------------------+ LEFT     CompressibilityPhasicitySpontaneityPropertiesThrombus Aging      +---------+---------------+---------+-----------+----------+-------------------+  CFV      Full           Yes      Yes                                      +---------+---------------+---------+-----------+----------+-------------------+ SFJ      Full                                                             +---------+---------------+---------+-----------+----------+-------------------+ FV Prox  Full                                                             +---------+---------------+---------+-----------+----------+-------------------+ FV Mid                  Yes      Yes                  unable to compress                                                        due to patient pain  tolerance           +---------+---------------+---------+-----------+----------+-------------------+ FV Distal               Yes      Yes                  unable to compress                                                        due to patient pain                                                       tolerance           +---------+---------------+---------+-----------+----------+-------------------+ PFV      Full                                                             +---------+---------------+---------+-----------+----------+-------------------+ POP      Full           Yes      Yes                                      +---------+---------------+---------+-----------+----------+-------------------+ PTV      Full                                                             +---------+---------------+---------+-----------+----------+-------------------+ PERO     Full                                                             +---------+---------------+---------+-----------+----------+-------------------+     Summary: BILATERAL: - No evidence of deep vein thrombosis seen in the lower extremities, bilaterally. - No evidence of superficial venous thrombosis in the lower  extremities, bilaterally. -   *See table(s) above for measurements and observations. Electronically signed by Sherald Hess MD on 02/21/2020 at 12:47:17 PM.    Final     Microbiology: Recent Results (from the past 240 hour(s))  SARS Coronavirus 2 by RT PCR (hospital order, performed in [facility] hospital lab) Nasopharyngeal Nasopharyngeal Swab     Status: None   Collection Time: 02/20/20 12:40 PM   Specimen: Nasopharyngeal Swab  Result Value Ref Range Status   SARS Coronavirus 2 NEGATIVE NEGATIVE Final    Comment: (NOTE) SARS-CoV-2 target nucleic acids are NOT DETECTED.  The SARS-CoV-2 RNA is generally detectable in upper and lower respiratory specimens during the acute phase of infection. The lowest concentration of SARS-CoV-2  viral copies this assay can detect is 250 copies / mL. A negative result does not preclude SARS-CoV-2 infection and should not be used as the sole basis for treatment or other patient management decisions.  A negative result may occur with improper specimen collection / handling, submission of specimen other than nasopharyngeal swab, presence of viral mutation(s) within the areas targeted by this assay, and inadequate number of viral copies (<250 copies / mL). A negative result must be combined with clinical observations, patient history, and epidemiological information.  Fact Sheet for Patients:   BoilerBrush.com.cyhttps://www.fda.gov/media/136312/download  Fact Sheet for Healthcare Providers: https://pope.com/https://www.fda.gov/media/136313/download  This test is not yet approved or  cleared by the [place] FDA and has been authorized for detection and/or diagnosis of SARS-CoV-2 by FDA under an Emergency Use Authorization (EUA).  This EUA will remain in effect (meaning this test can be used) for the duration of the COVID-19 declaration under Section 564(b)(1) of the Act, 21 U.S.C. section 360bbb-3(b)(1), unless the authorization is terminated or revoked sooner.  Performed at  [facility], [address] [place], [place] [address]      Labs: Basic Metabolic Panel: Recent Labs  Lab 02/20/20 1139 02/20/20 1141 02/21/20 0502 02/21/20 1655  NA 136 137 136  --   K 4.2 4.0 4.2  --   CL 104 105 106  --   CO2 22  --  23  --   GLUCOSE 187* 185* 152*  --   BUN 30* 32* 25*  --   CREATININE 1.67* 1.80* 1.35*  --   CALCIUM 8.7*  --  8.8*  --   MG  --   --   --  2.1  PHOS  --   --   --  3.3   Liver Function Tests: Recent Labs  Lab 02/20/20 1139  AST 18  ALT 23  ALKPHOS 95  BILITOT 0.9  PROT 6.6  ALBUMIN 3.4*   No results for input(s): LIPASE, AMYLASE in the last 168 hours. Recent Labs  Lab 02/20/20 1240  AMMONIA 15   CBC: Recent Labs  Lab 02/20/20 1139 02/20/20 1141  WBC 6.7  --   NEUTROABS 3.8  --   HGB 11.6* 11.9*  HCT 37.4 35.0*  MCV 90.3  --   PLT 303  --    Cardiac Enzymes: No results for input(s): CKTOTAL, CKMB, CKMBINDEX, TROPONINI in the last 168 hours. BNP: BNP (last 3 results) No results for input(s): BNP in the last 8760 hours.  ProBNP (last 3 results) No results for input(s): PROBNP in the last 8760 hours.  CBG: Recent Labs  Lab 02/21/20 1149 02/21/20 1700 02/21/20 2110 02/22/20 0611 02/22/20 1140  GLUCAP 131* 237* 334* 138* 220*       Signed:  Jerico Grisso  [facility] 02/22/2020, 3:51 PM

## 2020-02-22 NOTE — Progress Notes (Signed)
IV removed with catheter in tact. 2x2 gauze applied to site. No bleeding noted at time of discharge. All Discharge instructions reviewed with patient including proper administration instructions for trial drugs. Trial drug kit given, provided by pharmacy. Pt verbalized full understanding of all instructions.

## 2020-02-22 NOTE — Progress Notes (Signed)
STROKE TEAM PROGRESS NOTE   INTERVAL HISTORY Pt son at bedside. Pt sitting in bed, no acute event overnight. Pt has been enrolled in the BMS Axiomatic trial and has tolerated medication so far.  Vitals:   02/21/20 1948 02/22/20 0354 02/22/20 0852 02/22/20 1142  BP: (!) 146/72 (!) 141/72 (!) 167/68 (!) 151/78  Pulse: 64 57 80 56  Resp: 17 20 20 20   Temp: 98.5 F (36.9 C) 97.7 F (36.5 C) 98 F (36.7 C) 98.2 F (36.8 C)  TempSrc: Oral Oral Oral Oral  SpO2: 100% 100% 98% 100%  Weight:      Height:       CBC:  Recent Labs  Lab 02/20/20 1139 02/20/20 1141  WBC 6.7  --   NEUTROABS 3.8  --   HGB 11.6* 11.9*  HCT 37.4 35.0*  MCV 90.3  --   PLT 303  --    Basic Metabolic Panel:  Recent Labs  Lab 02/20/20 1139 02/20/20 1139 02/20/20 1141 02/21/20 0502 02/21/20 1655  NA 136   < > 137 136  --   K 4.2   < > 4.0 4.2  --   CL 104   < > 105 106  --   CO2 22  --   --  23  --   GLUCOSE 187*   < > 185* 152*  --   BUN 30*   < > 32* 25*  --   CREATININE 1.67*   < > 1.80* 1.35*  --   CALCIUM 8.7*  --   --  8.8*  --   MG  --   --   --   --  2.1  PHOS  --   --   --   --  3.3   < > = values in this interval not displayed.   Lipid Panel:  Recent Labs  Lab 02/21/20 0502  CHOL 140  TRIG 50  HDL 43  CHOLHDL 3.3  VLDL 10  LDLCALC 87   HgbA1c:  Recent Labs  Lab 02/21/20 0502  HGBA1C 9.2*   Urine Drug Screen:  Recent Labs  Lab 02/20/20 1710  LABOPIA NONE DETECTED  COCAINSCRNUR NONE DETECTED  LABBENZ NONE DETECTED  AMPHETMU NONE DETECTED  THCU NONE DETECTED  LABBARB NONE DETECTED    Alcohol Level  Recent Labs  Lab 02/20/20 1139  ETH <10    IMAGING past 24 hours MR BRAIN WO CONTRAST  Result Date: 02/22/2020 CLINICAL DATA:  Stroke follow-up EXAM: MRI HEAD WITHOUT CONTRAST TECHNIQUE: Multiplanar, multiecho pulse sequences of the brain and surrounding structures were obtained without intravenous contrast. COMPARISON:  Brain MRI 02/20/2020 FINDINGS: Small acute  infarct of the left precentral gyrus. There is an old infarct the left temporal lobe. No acute hemorrhage. There is multifocal periventricular white matter hyperintensity, most often a result of chronic microvascular ischemia. IMPRESSION: Small acute infarct of the left precentral gyrus. Electronically Signed   By: 02/22/2020 M.D.   On: 02/22/2020 00:15    PHYSICAL EXAM  Pleasant obese middle-aged African-American lady not in distress. . Afebrile. Head is nontraumatic. Neck is supple without bruit.    Cardiac exam no murmur or gallop. Lungs are clear to auscultation. Distal pulses are well felt.  Neurological Exam  Awake  Alert oriented x 3. Normal speech and language.eye movements full without nystagmus.fundi were not visualized. Vision acuity and fields appear normal. Hearing is normal. Palatal movements are normal. Face symmetric. Tongue midline. Normal strength, tone, reflexes and coordination.  Mild diminished fine finger movements on the right.  FTN intact. Normal sensation. Gait deferred.   ASSESSMENT/PLAN Ms. Catherine Bautista is a 69 y.o. female with history of strokes, obesity, hypertension, dyslipidemia, and diabetes presenting with slurred speech headache and some aphasia.   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 Old L MCA infarcts. Old L parietal and frontal lobe cortical infarcts. Old L thalamic lacune. Atrophy. ASPECTS 10.     MRI  Small L frontal lobe infarct in primary motor cortex w/ petechial hemorrhage.  MRA  No LVO. B ICA stenoses  Carotid Doppler  B ICA 1-39% stenosis, VAs antegrade   2D Echo w/ bubble EF 60-65%. No source of embolus. LA dilated. No obvious intra-atrial shunt.  LE Doppler  No DVT  LDL 87  HgbA1c 9.2  VTE prophylaxis - Heparin 5000 units sq tid   aspirin 81 mg daily prior to admission, now on BMS Axiomatic trial medications.    Therapy  recommendations:  None  Disposition:  Home today   Hx stroke/TIA  12/2018 - L posterior frontal infarcts. MRA b/l siphon 50-75% stenosis. EF 60-65%. 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  Loop recorder did not show any new A. fib. LDL 56 and A1C 8.8. put on DAPT  04/2017 TEE repeat showed redundant chordae tendonea and PFO. Hypercoagulable test negative.   12/2015 - punctate left frontal cortical infarct - possibly embolic, source unknown. MRA right ICA mild to moderate stenosis. DVT neg. EF 60 to 65%, carotids were normal, TEE  showed PFO and a questionable redundant chordae tendonae but could not rule out small vegetation.  loop recorder was placed. LDL 96 and A1C 9.9. Enrolled in the [employer], once completed trial was placed on Plavix alone.  2011 right frontal infarct  2002 left MCA infarct   Hypertension  Stable . Permissive hypertension (OK if < 220/120) but gradually normalize in 5-7 days . Long-term BP goal normotensive  Hyperlipidemia  Home meds:  lipitor 40 & fish oil, resumed in hospital  LDL 87, goal < 70  Increase lipitor to 80  Continue statin at discharge  Diabetes type II Uncontrolled  HgbA1c 9.2, goal < 7.0  SSI  CBGs  DM education provided again  Close PCP follow up for better DM control  Other Stroke Risk Factors  Advanced age  Morbid Obesity, Body mass index is 45.9 kg/m., recommend weight loss, diet and exercise as appropriate   Family hx stroke (mother, cousin)  Obstructive sleep apnea uses CPAP nightly   Other Active Problems  AKI  Graves disease   Hospital day # 2  Neurology will sign off. Please call with questions. Pt will follow up with stroke clinic Dr. Pearlean Brownie at [facility] in about 4 weeks. Thanks for the consult.  Marvel Plan, MD PhD Stroke Neurology 02/23/2020 6:41 AM   To contact Stroke Continuity provider, please refer to [email or web address]. After hours, contact General  Neurology

## 2020-02-22 NOTE — Evaluation (Signed)
Occupational Therapy Evaluation Patient Details Name: Catherine Bautista MRN: [id number] DOB: April 27, 1951 Today's Date: 02/22/2020    History of Present Illness Pt is a 69 y/o female admitted secondary to difficulty speaking. Found to have L frontal lobe infarct. PMH includes HTN, DM, CVA, graves disease, and OSA on CPAP.    Clinical Impression   PTA Pt is independent in mobility (uses SPC PRN for pain) and ADL/IADL. 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 Today Pt is greeted performing ADL standing at sink with RN- but able to perform all tasks at independent level. Transfers and mobility at supervision for safety - but no LOB. Pt reports improvement in R hand/arm. During fine motor assessment (MMT and fuctional) strength deficits noted 4/5 - but functional for all tasks witnessed. Pt is R handed. Educated Pt that if she gets home and is having trouble with bigger/heavier objects (like pots and pans during cooking) That she should follow up and request OPOT. Due to improvement in the past day I do not recommend acutely. Pt verbalized understanding. I also educated her on energy conservation and built up handles for Rhand. Pt was so pleasant and wonderful to work with. Pt with no questions or concerns at end of session, and OT to sign off at this time.     Follow Up Recommendations  No OT follow up    Equipment Recommendations  None recommended by OT    Recommendations for Other Services       Precautions / Restrictions Precautions Precautions: None Restrictions Weight Bearing Restrictions: No      Mobility Bed Mobility Overal bed mobility: Modified Independent                Transfers Overall transfer level: Needs assistance Equipment used: None Transfers: Sit to/from Stand Sit to Stand: Supervision         General transfer comment: Supervision for safety.     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 She is right handed. She was able to open containers, hold small objects, manipulate phone, however she reports fatigue in hand. Educated Pt on built up handles and that if problem persists (especially in IADL - cooking etc) that she would need to request neuro OPOT from primary care or neurologist     Vision Baseline Vision/History: Wears glasses Wears Glasses: At all times Patient Visual Report: No change from baseline       Perception     Praxis      Pertinent Vitals/Pain Pain Assessment: No/denies pain     Hand Dominance Right   Extremity/Trunk Assessment Upper Extremity Assessment Upper Extremity Assessment: RUE deficits/detail RUE Deficits / Details: Pt functionally able to perform ADL - but hand fatigues, educated on built up handles and  using functionally (Pt is right handed) able to perform fine motor activities for ADL and manipulate phone RUE Sensation: WNL RUE Coordination: WNL   Lower Extremity Assessment Lower Extremity Assessment: Defer to PT evaluation   Cervical / Trunk Assessment Cervical / Trunk Assessment: Normal   Communication Communication Communication: No difficulties   Cognition Arousal/Alertness: Awake/alert Behavior During Therapy: WFL for tasks assessed/performed Overall Cognitive Status: Within Functional Limits for tasks assessed                                     General Comments       Exercises Exercises: Other exercises Other Exercises Other Exercises:  able to complete fine motor coordination etx - reports fatigue in R hand   Shoulder Instructions      Home Living Family/patient expects to be discharged to:: Private residence Living Arrangements: Spouse/significant other Available Help at Discharge: Family Type of Home: House Home Access: Stairs to enter [employer] of Steps: 2 Entrance Stairs-Rails: None Home Layout: Two level;Laundry or work area in basement Alternate Teacher, music of Steps: flight Alternate Level Stairs-Rails: Right Bathroom Shower/Tub: Tub/shower unit;Walk-in shower   Bathroom Toilet: Handicapped height     Home Equipment: Shower seat;Cane - single point;Walker - 2 wheels;Walker - 4 wheels   Additional Comments: Husbands caregiver       Prior Functioning/Environment Level of Independence: Independent with assistive device(s)        Comments: 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 Uses cane if her back is hurting        OT Problem List: Decreased activity tolerance;Impaired UE functional use      OT Treatment/Interventions:      OT Goals(Current goals can be found in the care plan section) Acute Rehab OT Goals Patient Stated Goal: to go home OT Goal Formulation: With patient Time For Goal Achievement: 03/07/20 Potential to Achieve Goals: Good  OT Frequency:     Barriers to D/C:            Co-evaluation              AM-PAC OT "6 Clicks" Daily Activity     Outcome Measure Help from another person eating meals?: None Help from another person taking care of personal grooming?: None Help from another person toileting, which includes using toliet, bedpan, or urinal?: None Help from another person bathing (including washing, rinsing, drying)?: None Help from another person to put on and taking off regular upper body clothing?: None Help from another person to put on and taking off regular lower body clothing?: None 6 Click Score: 24   End of Session Equipment Utilized  During Treatment: Gait belt Nurse Communication: Mobility status  Activity Tolerance: Patient tolerated treatment well Patient left: in chair;with call bell/phone within reach  OT Visit Diagnosis: Other abnormalities of gait and mobility (R26.89);Other symptoms and signs involving the nervous system (R29.898)                Time: 2831-5176 OT Time Calculation (min): 30 min Charges:  OT General Charges $OT Visit: 1 Visit OT Evaluation $OT Eval Low Complexity: 1 Low OT Treatments $Self Care/Home Management : 8-22 mins  Nyoka Cowden OTR/L Acute Rehabilitation Services Pager: [phone] Office: [phone]  Evern Bio Jakaylah Schlafer 02/22/2020, 11:39 AM

## 2020-02-23 ENCOUNTER — Other Ambulatory Visit: Payer: Self-pay | Admitting: Neurology

## 2020-02-24 ENCOUNTER — Other Ambulatory Visit (HOSPITAL_COMMUNITY): Payer: Self-pay | Admitting: Interventional Radiology

## 2020-02-24 DIAGNOSIS — I671 Cerebral aneurysm, nonruptured: Secondary | ICD-10-CM

## 2020-02-28 DIAGNOSIS — G4730 Sleep apnea, unspecified: Secondary | ICD-10-CM | POA: Diagnosis not present

## 2020-02-28 DIAGNOSIS — Z7984 Long term (current) use of oral hypoglycemic drugs: Secondary | ICD-10-CM | POA: Diagnosis not present

## 2020-02-28 DIAGNOSIS — I639 Cerebral infarction, unspecified: Secondary | ICD-10-CM | POA: Diagnosis not present

## 2020-02-28 DIAGNOSIS — E1169 Type 2 diabetes mellitus with other specified complication: Secondary | ICD-10-CM | POA: Diagnosis not present

## 2020-02-28 DIAGNOSIS — E7800 Pure hypercholesterolemia, unspecified: Secondary | ICD-10-CM | POA: Diagnosis not present

## 2020-02-28 DIAGNOSIS — E0590 Thyrotoxicosis, unspecified without thyrotoxic crisis or storm: Secondary | ICD-10-CM | POA: Diagnosis not present

## 2020-03-16 ENCOUNTER — Other Ambulatory Visit (HOSPITAL_COMMUNITY): Payer: Self-pay | Admitting: Interventional Radiology

## 2020-03-16 ENCOUNTER — Other Ambulatory Visit: Payer: Self-pay

## 2020-03-16 ENCOUNTER — Ambulatory Visit (HOSPITAL_COMMUNITY)
Admission: RE | Admit: 2020-03-16 | Discharge: 2020-03-16 | Disposition: A | Discharge status: Home / Self Care | Payer: Medicare PPO | Source: Ambulatory Visit | Attending: Interventional Radiology | Admitting: Interventional Radiology

## 2020-03-16 DIAGNOSIS — I771 Stricture of artery: Secondary | ICD-10-CM

## 2020-03-16 DIAGNOSIS — I671 Cerebral aneurysm, nonruptured: Secondary | ICD-10-CM

## 2020-03-16 NOTE — Consult Note (Signed)
Chief Complaint: Patient was seen in consultation today for evaluation of bilateral intracranial atrial carotid artery stenosis. Referring Physician(s): Dr. Pearlean Brownie.  With neurology.  History of Present Illness: Catherine Bautista is a 69 y.o. female with a past medical history as below, with pertinent past medical history including history of multiple ischemic strokes...   Patient was recently admitted with symptoms of expressive aphasia and word finding difficulties and also with subtle weakness of the right hand on 02/21/2020.  Her symptoms had resolved by the time she reached the hospital.  She underwent an MRI scan which showed an embolic left frontal cortical infarct.  Evidence of a previous left middle cerebral artery territory ischemia was also evident.  Head MRA examination revealed bilateral internal carotid artery stenosis in the distal cavernous segment on the left, and the caval cavernous segment on the righ.  T   Also noted was dysplastic change involving the cavernous segment of the right internal carotid artery with dysplastic dilatation, and with an aneurysmal outpouching..  Patient gives a prior history of multiple cryptogenic ischemic strokes dating back to 2002, and  the right frontal area in 2011.  She had also had ischemic infarct in 2017.  During her recent admission the patient denied having symptoms of vision abnormalities, diplopia, blindness, nausea vomiting facial numbness, lower extremity weakness, or incoordination or gait difficulties..  The patient denied having had previous episodes of seizure-like activity, or loss of consciousness.  Presently the patient reports significant improvement of her  symptoms.  It appears her main difficulty now is word finding challenges, mildly unclear speech, and also difficulty with fine motor movements in her right hand..  The patient does have a history of diabetes mellitus which is better controlled as per  patient.          Past Medical History:  Diagnosis Date  . Anemia   . Anemia, vitamin B12 deficiency   . Depression with anxiety   . Diabetes mellitus   . Dyslipidemia   . Fibromyalgia   . Graves disease   . Hypertension   . Obesity   . Stroke (HCC)    "I've had 3 strokes"    Past Surgical History:  Procedure Laterality Date  . ABDOMINAL HYSTERECTOMY  2007  . CHOLECYSTECTOMY     laproscopic  . EP IMPLANTABLE DEVICE N/A 01/05/2016   Procedure: Loop Recorder Insertion;  Surgeon: Will Jorja Loa, MD;  Location: [facility] INVASIVE CV LAB;  Service: Cardiovascular;  Laterality: N/A;  . GASTRIC BYPASS  1981  . TEE WITHOUT CARDIOVERSION N/A 01/05/2016   Procedure: TRANSESOPHAGEAL ECHOCARDIOGRAM (TEE);  Surgeon: Quintella Reichert, MD;  Location: [facility] ENDOSCOPY;  Service: Cardiovascular;  Laterality: N/A;  . TEE WITHOUT CARDIOVERSION N/A 04/18/2017   Procedure: TRANSESOPHAGEAL ECHOCARDIOGRAM (TEE);  Surgeon: Lewayne Bunting, MD;  Location: [facility] ENDOSCOPY;  Service: Cardiovascular;  Laterality: N/A;  . therapuetic abortion  1986   associated with C-section    Allergies: Canagliflozin, Demerol, Excedrin extra strength [aspirin-acetaminophen-caffeine], Janumet [sitagliptin-metformin hcl], Meperidine, Morphine and related, Novocain [procaine hcl], and Procaine  Medications: Prior to Admission medications   Medication Sig Start Date End Date Taking? Authorizing Provider  ACCU-CHEK SMARTVIEW test strip USE TO CHECK YOUR BLOOD SUGAR THREE TIMES A DAY DX E13.9 IN VITRO 90 DAYS 06/24/19   [provider]  atorvastatin (LIPITOR) 80 MG tablet Take 1 tablet (80 mg total) by mouth at bedtime. 02/22/20   Edsel Petrin, DO  b complex vitamins tablet Take 1 tablet by mouth  daily. Reported on 01/13/2016    [provider]  BD PEN NEEDLE NANO 2ND GEN 32G X 4 MM MISC USE TO TEST BLOOD SUGAR 3 TIMES DAILY 07/01/19   [provider]  Cholecalciferol (VITAMIN D3) 50 MCG (2000 UT)  TABS Take 2,000 Units by mouth daily.    [provider]  ferrous sulfate 325 (65 FE) MG tablet Take 325 mg by mouth daily with breakfast.    [provider]  glimepiride (AMARYL) 4 MG tablet Take 4 mg by mouth daily with breakfast.    [provider]  hydrochlorothiazide (HYDRODIURIL) 25 MG tablet Take 1 tablet (25 mg total) by mouth daily. 01/11/19   Joseph Art, DO  Insulin Detemir (LEVEMIR FLEXTOUCH) 100 UNIT/ML Pen Inject 54 Units into the skin at bedtime.     [provider]  Magnesium 300 MG CAPS Take 300 mg by mouth daily.    [provider]  metFORMIN (GLUCOPHAGE-XR) 750 MG 24 hr tablet Take 750 mg by mouth 2 (two) times a day.    [provider]  OMEGA 3 1200 MG CAPS Take 1,200 mg by mouth at bedtime.     [provider]  Prenatal Vit-Fe Fumarate-FA (PRENATAL PO) Take 1 tablet by mouth daily.    [provider]  ramipril (ALTACE) 10 MG capsule Take 10 mg by mouth daily.      [provider]  vitamin B-12 (CYANOCOBALAMIN) 1000 MCG tablet Take 1,000 mcg by mouth daily.     [provider]  vitamin C (ASCORBIC ACID) 500 MG tablet Take 500 mg by mouth daily.    [provider]     Family History  Problem Relation Age of Onset  . Stroke Mother   . Heart failure Mother   . Lung cancer Father   . Kidney cancer Brother   . Stroke Cousin     Social History   Socioeconomic History  . Marital status: Married    Spouse name: Not on file  . Number of children: Not on file  . Years of education: Not on file  . Highest education level: Not on file  Occupational History  . Not on file  Tobacco Use  . Smoking status: Never Smoker  . Smokeless tobacco: Never Used  Vaping Use  . Vaping Use: Never used  Substance and Sexual Activity  . Alcohol use: No  . Drug use: No  . Sexual activity: Not on file  Other Topics Concern  . Not on file  Social History Narrative   Lives at home with  husband   Right handed   Caffeine: green tea and regular tea, about 24 oz daily   Social Determinants of Health   Financial Resource Strain:   . Difficulty of Paying Living Expenses:   Food Insecurity:   . Worried About Programme researcher, broadcasting/film/video in the Last Year:   . Barista in the Last Year:   Transportation Needs:   . Freight forwarder (Medical):   Marland Kitchen Lack of Transportation (Non-Medical):   Physical Activity:   . Days of Exercise per Week:   . Minutes of Exercise per Session:   Stress:   . Feeling of Stress :   Social Connections:   . Frequency of Communication with Friends and Family:   . Frequency of Social Gatherings with Friends and Family:   . Attends Religious Services:   . Active Member of Clubs or Organizations:   . Attends  Club or Organization Meetings:   Marland Kitchen Marital Status:      Review of Systems: A 12 point ROS discussed and pertinent positives are indicated in the HPI above.  All other systems are negative.  Review of Systems negative except for as mentioned above  Vital Signs: There were no vitals taken for this visit.  Physical Exam.  Neurologically Patient is alert awake oriented to time place space.  Appears in no acute distress.  Speech is mildly slurred with mild word finding difficulties.  Comprehension is intact.  Range of movements is grossly intact.  No facial asymmetry.  Tongue is in the midline.  Grossly no lateralizing motor or sensory or coordination findings.  Station and gait grossly intact.   Imaging: MR ANGIO HEAD WO CONTRAST  Result Date: 02/20/2020 CLINICAL DATA:  Code stroke follow-up EXAM: MRI HEAD WITHOUT CONTRAST MRA HEAD WITHOUT CONTRAST TECHNIQUE: Multiplanar, multiecho pulse sequences of the brain and surrounding structures were obtained without intravenous contrast. Angiographic images of the head were obtained using MRA technique without contrast. COMPARISON:  08/21/2019 MRI head, 01/08/2019 MRA head FINDINGS: MRI  HEAD Brain: There is small area of cortical/subcortical reduced diffusion along the left precentral gyrus at the lateral aspect of the hand motor region. There is some corresponding susceptibility likely reflecting petechial hemorrhage. Chronic left MCA territory infarction centered in the left temporal lobe. Chronic left frontoparietal cortical infarcts. Chronic left thalamic infarct. Minimal additional foci of T2 hyperintensity in the supratentorial white matter likely reflect minor chronic microvascular ischemic changes. There is no intracranial mass or mass effect. There is no hydrocephalus or extra-axial fluid collection. Ventricles are stable in size. Vascular: Major vessel flow voids at the skull base are preserved. Skull and upper cervical spine: Normal marrow signal is preserved. Sinuses/Orbits: Paranasal sinuses are aerated. Orbits are unremarkable. Other: Sella is unremarkable.  Mastoid air cells are clear. MRA HEAD Intracranial internal carotid arteries are patent with atherosclerotic irregularity and moderate stenoses along cavernous and paraclinoid portions. Appearance is similar to the prior study. Middle and anterior cerebral arteries are patent. Intracranial vertebral arteries, basilar artery, posterior cerebral arteries are patent. There is no aneurysm. IMPRESSION: Small acute infarct of the left frontal lobe involving the primary motor cortex. Corresponding susceptibility reflecting petechial hemorrhage. No proximal intracranial vessel occlusion. Similar bilateral ICA stenoses. Stable additional chronic findings detailed above. Electronically Signed   By: Guadlupe Spanish M.D.   On: 02/20/2020 18:45   MR BRAIN WO CONTRAST  Result Date: 02/22/2020 CLINICAL DATA:  Stroke follow-up EXAM: MRI HEAD WITHOUT CONTRAST TECHNIQUE: Multiplanar, multiecho pulse sequences of the brain and surrounding structures were obtained without intravenous contrast. COMPARISON:  Brain MRI 02/20/2020 FINDINGS: Small  acute infarct of the left precentral gyrus. There is an old infarct the left temporal lobe. No acute hemorrhage. There is multifocal periventricular white matter hyperintensity, most often a result of chronic microvascular ischemia. IMPRESSION: Small acute infarct of the left precentral gyrus. Electronically Signed   By: Deatra Robinson M.D.   On: 02/22/2020 00:15   MR BRAIN WO CONTRAST  Result Date: 02/20/2020 CLINICAL DATA:  Code stroke follow-up EXAM: MRI HEAD WITHOUT CONTRAST MRA HEAD WITHOUT CONTRAST TECHNIQUE: Multiplanar, multiecho pulse sequences of the brain and surrounding structures were obtained without intravenous contrast. Angiographic images of the head were obtained using MRA technique without contrast. COMPARISON:  08/21/2019 MRI head, 01/08/2019 MRA head FINDINGS: MRI HEAD Brain: There is small area of cortical/subcortical reduced diffusion along the left precentral gyrus at the lateral aspect  of the hand motor region. There is some corresponding susceptibility likely reflecting petechial hemorrhage. Chronic left MCA territory infarction centered in the left temporal lobe. Chronic left frontoparietal cortical infarcts. Chronic left thalamic infarct. Minimal additional foci of T2 hyperintensity in the supratentorial white matter likely reflect minor chronic microvascular ischemic changes. There is no intracranial mass or mass effect. There is no hydrocephalus or extra-axial fluid collection. Ventricles are stable in size. Vascular: Major vessel flow voids at the skull base are preserved. Skull and upper cervical spine: Normal marrow signal is preserved. Sinuses/Orbits: Paranasal sinuses are aerated. Orbits are unremarkable. Other: Sella is unremarkable.  Mastoid air cells are clear. MRA HEAD Intracranial internal carotid arteries are patent with atherosclerotic irregularity and moderate stenoses along cavernous and paraclinoid portions. Appearance is similar to the prior study. Middle and anterior  cerebral arteries are patent. Intracranial vertebral arteries, basilar artery, posterior cerebral arteries are patent. There is no aneurysm. IMPRESSION: Small acute infarct of the left frontal lobe involving the primary motor cortex. Corresponding susceptibility reflecting petechial hemorrhage. No proximal intracranial vessel occlusion. Similar bilateral ICA stenoses. Stable additional chronic findings detailed above. Electronically Signed   By: Guadlupe Spanish M.D.   On: 02/20/2020 18:45   ECHOCARDIOGRAM COMPLETE BUBBLE STUDY  Result Date: 02/21/2020    ECHOCARDIOGRAM REPORT   Patient Name:   JAKYRIA BLEAU Plass  Date of Exam: 02/21/2020 Medical Rec #:  [id number]  Height:       66.0 in Accession #:    [id number] Weight:       284.4 lb Date of Birth:  [birth year] BSA:          2.323 m Patient Age:    68 years   BP:           157/73 mmHg Patient Gender: F          HR:           56 bpm. Exam Location:  Inpatient Procedure: 2D Echo and Saline Contrast Bubble Study Indications:    stroke 434.91  History:        Patient has prior history of Echocardiogram examinations, most                 recent 01/08/2019. Stroke.  Sonographer:    Delcie Roch Referring Phys: [id number] VISHAL R PATEL IMPRESSIONS  1. Left ventricular ejection fraction, by estimation, is 60 to 65%. The left ventricle has normal function. The left ventricle has no regional wall motion abnormalities. Left ventricular diastolic parameters are indeterminate. There is Septal bounce in diastole.  2. Right ventricular systolic function is normal. The right ventricular size is normal. Tricuspid regurgitation signal is inadequate for assessing PA pressure.  3. Left atrial size was mildly dilated.  4. The mitral valve is normal in structure. Trivial mitral valve regurgitation. No evidence of mitral stenosis.  5. The aortic valve is tricuspid. Aortic valve regurgitation is trivial. Mild aortic valve sclerosis is present, with no evidence of aortic valve stenosis.  6.  Aortic dilatation noted. There is mild dilatation of the ascending aorta measuring 40 mm.  7. The inferior vena cava is normal in size with greater than 50% respiratory variability, suggesting right atrial pressure of 3 mmHg.  8. No apparent intraatrial shunt, though difficult images. Comparison(s): No significant change from prior study. Conclusion(s)/Recommendation(s): No intracardiac source of embolism detected on this transthoracic study. A transesophageal echocardiogram is recommended to exclude cardiac source of embolism if clinically indicated. FINDINGS  Left Ventricle: Left ventricular ejection  fraction, by estimation, is 60 to 65%. The left ventricle has normal function. The left ventricle has no regional wall motion abnormalities. The left ventricular internal cavity size was normal in size. There is  no left ventricular hypertrophy. Septal bounce in diastole. Left ventricular diastolic parameters are indeterminate. Right Ventricle: The right ventricular size is normal. No increase in right ventricular wall thickness. Right ventricular systolic function is normal. Tricuspid regurgitation signal is inadequate for assessing PA pressure. Left Atrium: Left atrial size was mildly dilated. Right Atrium: Right atrial size was normal in size. Pericardium: Trivial pericardial effusion is present. Mitral Valve: The mitral valve is normal in structure. Mild mitral annular calcification. Trivial mitral valve regurgitation. No evidence of mitral valve stenosis. Tricuspid Valve: The tricuspid valve is normal in structure. Tricuspid valve regurgitation is trivial. No evidence of tricuspid stenosis. Aortic Valve: The aortic valve is tricuspid. Aortic valve regurgitation is trivial. Mild aortic valve sclerosis is present, with no evidence of aortic valve stenosis. There is mild calcification of the aortic valve. Pulmonic Valve: The pulmonic valve was not well visualized. Pulmonic valve regurgitation is trivial. Aorta:  Aortic dilatation noted. There is mild dilatation of the ascending aorta measuring 40 mm. Venous: The inferior vena cava is normal in size with greater than 50% respiratory variability, suggesting right atrial pressure of 3 mmHg. IAS/Shunts: The atrial septum is grossly normal. Agitated saline contrast was given intravenously to evaluate for intracardiac shunting. No apparent intraatrial shunt, though difficult images.  LEFT VENTRICLE PLAX 2D LVIDd:         4.70 cm     Diastology LVIDs:         3.00 cm     LV e' lateral: 7.83 cm/s LV PW:         1.10 cm     LV e' medial:  5.00 cm/s LV IVS:        1.00 cm LVOT diam:     2.20 cm LVOT Area:     3.80 cm  LV Volumes (MOD) LV vol d, MOD A2C: 72.6 ml LV vol d, MOD A4C: 89.4 ml LV vol s, MOD A2C: 35.3 ml LV vol s, MOD A4C: 38.4 ml LV SV MOD A2C:     37.3 ml LV SV MOD A4C:     89.4 ml LV SV MOD BP:      45.9 ml RIGHT VENTRICLE             IVC RV S prime:     12.70 cm/s  IVC diam: 1.70 cm TAPSE (M-mode): 1.7 cm LEFT ATRIUM             Index       RIGHT ATRIUM           Index LA diam:        3.30 cm 1.42 cm/m  RA Area:     14.30 cm LA Vol (A2C):   70.3 ml 30.27 ml/m RA Volume:   36.00 ml  15.50 ml/m LA Vol (A4C):   75.6 ml 32.55 ml/m LA Biplane Vol: 78.3 ml 33.71 ml/m   AORTA Ao Root diam: 3.90 cm Ao Asc diam:  4.00 cm  SHUNTS Systemic Diam: 2.20 cm Jodelle Red MD Electronically signed by Jodelle Red MD Signature Date/Time: 02/21/2020/1:14:38 PM    Final    CT HEAD CODE STROKE WO CONTRAST  Result Date: 02/20/2020 CLINICAL DATA:  Code stroke.  Prior CVA, slurred speech and aphasia. EXAM: CT HEAD WITHOUT CONTRAST TECHNIQUE: Contiguous axial  images were obtained from the base of the skull through the vertex without intravenous contrast. COMPARISON:  Brain MRI 08/21/2019. FINDINGS: Brain: Stable, mild generalized parenchymal atrophy. Redemonstrated remote left MCA territory cortical/subcortical infarct affecting portions of the left temporal, parietal  and occipital lobes as well as posterior left insula. Additional known small chronic cortically based infarcts within the left parietal and frontal lobes as previously demonstrated on the MRI of 08/21/2019. Redemonstrated chronic left thalamic lacunar infarct. There is no evidence of acute intracranial hemorrhage. No acute demarcated cortical infarction is identified. No extra-axial fluid collection. No evidence of intracranial mass. No midline shift. Vascular: No hyperdense vessel.  Atherosclerotic calcifications. Skull: Normal. Negative for fracture or focal lesion. Sinuses/Orbits: Visualized orbits show no acute finding. No significant paranasal sinus disease or mastoid effusion at the imaged levels. ASPECTS [facility]([place] Stroke Program Early CT Score) - Ganglionic level infarction (caudate, lentiform nuclei, internal capsule, insula, M1-M3 cortex): 7 - Supraganglionic infarction (M4-M6 cortex): 3 Total score (0-10 with 10 being normal): 10 (when discounting chronic infarction changes). These results were communicated to Dr. Laurence SlateAroor At 11:56 amon 7/22/2021by text page via the [facility] messaging system. IMPRESSION: No CT evidence of acute intracranial abnormality. ASPECTS is 10. Redemonstrated chronic cortical/subcortical left MCA infarct affecting portions of the left temporal, parietal and occipital lobes as well as posterior left insula. Additional small chronic cortical infarcts within the left parietal and frontal lobes, unchanged. Redemonstrated chronic left thalamic lacunar infarct. Stable, mild generalized parenchymal atrophy. Electronically Signed   By: Jackey LogeKyle  Golden DO   On: 02/20/2020 11:57   [facility])  Result Date: 02/21/2020 Carotid Arterial Duplex Study Indications:       CVA. Comparison Study:  Prior study 01-04-2016 Rt and LT 1-39% stenosis in ICA, mild                    plaque Performing Technologist: Jean Rosenthalachel Hodge  Examination Guidelines: A complete evaluation includes B-mode  imaging, spectral Doppler, color Doppler, and power Doppler as needed of all accessible portions of each vessel. Bilateral testing is considered an integral part of a complete examination. Limited examinations for reoccurring indications may be performed as noted.  Right Carotid Findings: +----------+--------+--------+--------+-----------------------+--------+           PSV cm/sEDV cm/sStenosisPlaque Description     Comments +----------+--------+--------+--------+-----------------------+--------+ CCA Prox  109     14              heterogenous and smooth         +----------+--------+--------+--------+-----------------------+--------+ CCA Distal111     17              heterogenous and smooth         +----------+--------+--------+--------+-----------------------+--------+ ICA Prox  64      7       1-39%   heterogenous                    +----------+--------+--------+--------+-----------------------+--------+ ICA Distal58      17                                              +----------+--------+--------+--------+-----------------------+--------+ ECA       117                                                     +----------+--------+--------+--------+-----------------------+--------+ +---------+--------+--+--------+--+  VertebralPSV cm/s47EDV cm/s11 +---------+--------+--+--------+--+  Left Carotid Findings: +----------+--------+--------+--------+------------------+------------------+           PSV cm/sEDV cm/sStenosisPlaque DescriptionComments           +----------+--------+--------+--------+------------------+------------------+ CCA Prox  112     13                                intimal thickening +----------+--------+--------+--------+------------------+------------------+ CCA Distal91      14                                intimal thickening +----------+--------+--------+--------+------------------+------------------+ ICA Prox  49      9        1-39%   heterogenous                         +----------+--------+--------+--------+------------------+------------------+ ICA Distal47      8                                                    +----------+--------+--------+--------+------------------+------------------+ ECA       115                                                          +----------+--------+--------+--------+------------------+------------------+ +----------+--------+--------+--------+-------------------+           PSV cm/sEDV cm/sDescribeArm Pressure (mmHG) +----------+--------+--------+--------+-------------------+ [id number]                                         +----------+--------+--------+--------+-------------------+ +---------+--------+--+--------+--+ VertebralPSV cm/s75EDV cm/s12 +---------+--------+--+--------+--+   Summary: Right Carotid: Velocities in the right ICA are consistent with a 1-39% stenosis.                Non-hemodynamically significant plaque <50% noted in the CCA. Left Carotid: Velocities in the left ICA are consistent with a 1-39% stenosis.  *See table(s) above for measurements and observations.  Electronically signed by Delia Heady MD on 02/21/2020 at 2:19:11 PM.    Final    VAS [place] LOWER EXTREMITY VENOUS (DVT)  Result Date: 02/21/2020  Lower Venous DVTStudy Indications: Stroke.  Limitations: Pain tolerance. Comparison Study: 01/04/16 previous Performing Technologist: Blanch Media RVS  Examination Guidelines: A complete evaluation includes B-mode imaging, spectral Doppler, color Doppler, and power Doppler as needed of all accessible portions of each vessel. Bilateral testing is considered an integral part of a complete examination. Limited examinations for reoccurring indications may be performed as noted. The reflux portion of the exam is performed with the patient in reverse Trendelenburg.  +---------+---------------+---------+-----------+----------+-------------------+  RIGHT    CompressibilityPhasicitySpontaneityPropertiesThrombus Aging      +---------+---------------+---------+-----------+----------+-------------------+ CFV      Full           Yes      Yes                                      +---------+---------------+---------+-----------+----------+-------------------+  SFJ      Full                                                             +---------+---------------+---------+-----------+----------+-------------------+ FV Prox                 Yes      Yes                  unable to compress                                                        due to patient pain                                                       tolerance           +---------+---------------+---------+-----------+----------+-------------------+ FV Mid                  Yes      Yes                  unable to compress                                                        due to patient pain                                                       tolerance           +---------+---------------+---------+-----------+----------+-------------------+ FV DistalFull                                                             +---------+---------------+---------+-----------+----------+-------------------+ PFV      Full                                                             +---------+---------------+---------+-----------+----------+-------------------+ POP      Full           Yes      Yes                                      +---------+---------------+---------+-----------+----------+-------------------+  PTV      Full                                                             +---------+---------------+---------+-----------+----------+-------------------+ PERO     Full                                                             +---------+---------------+---------+-----------+----------+-------------------+    +---------+---------------+---------+-----------+----------+-------------------+ LEFT     CompressibilityPhasicitySpontaneityPropertiesThrombus Aging      +---------+---------------+---------+-----------+----------+-------------------+ CFV      Full           Yes      Yes                                      +---------+---------------+---------+-----------+----------+-------------------+ SFJ      Full                                                             +---------+---------------+---------+-----------+----------+-------------------+ FV Prox  Full                                                             +---------+---------------+---------+-----------+----------+-------------------+ FV Mid                  Yes      Yes                  unable to compress                                                        due to patient pain                                                       tolerance           +---------+---------------+---------+-----------+----------+-------------------+ FV Distal               Yes      Yes                  unable to compress  due to patient pain                                                       tolerance           +---------+---------------+---------+-----------+----------+-------------------+ PFV      Full                                                             +---------+---------------+---------+-----------+----------+-------------------+ POP      Full           Yes      Yes                                      +---------+---------------+---------+-----------+----------+-------------------+ PTV      Full                                                             +---------+---------------+---------+-----------+----------+-------------------+ PERO     Full                                                              +---------+---------------+---------+-----------+----------+-------------------+     Summary: BILATERAL: - No evidence of deep vein thrombosis seen in the lower extremities, bilaterally. - No evidence of superficial venous thrombosis in the lower extremities, bilaterally. -   *See table(s) above for measurements and observations. Electronically signed by Sherald Hess MD on 02/21/2020 at 12:47:17 PM.    Final     Labs:  CBC: Recent Labs    08/14/19 1339 02/20/20 1139 02/20/20 1141  WBC 7.8 6.7  --   HGB 11.5 11.6* 11.9*  HCT 35.8 37.4 35.0*  PLT 332 303  --     COAGS: Recent Labs    02/20/20 1139  INR 1.0  APTT 27    BMP: Recent Labs    08/14/19 1339 02/20/20 1139 02/20/20 1141 02/21/20 0502  NA 141 136 137 136  K 4.4 4.2 4.0 4.2  CL 107* 104 105 106  CO2 24 22  --  23  GLUCOSE 123* 187* 185* 152*  BUN 15 30* 32* 25*  CALCIUM 9.0 8.7*  --  8.8*  CREATININE 1.09* 1.67* 1.80* 1.35*  GFRNONAA 53* 31*  --  41*  GFRAA 61 36*  --  47*    LIVER FUNCTION TESTS: Recent Labs    08/14/19 1339 02/20/20 1139  BILITOT 0.3 0.9  AST 27 18  ALT 38* 23  ALKPHOS 131* 95  PROT 6.5 6.6  ALBUMIN 3.8 3.4*    TUMOR MARKERS: No results for input(s): AFPTM, CEA, CA199, CHROMGRNA in the last 8760 hours.  Assessment and Plan:  Patients MRI  brain MRA of brain and CT of the brain during the recent admission, and previous studies were reviewed.  The most recent MRI reveals a cortical diffusion-weighted abnormality in the left frontal cortical motor strip.  Old ischemic changes involving the left MCA posterior frontal parietal and temporal cortical and subcortical areas with gliosis are also evident.Marland Kitchen  MRI of the brain reveals a tapered severe narrowing involving the junction of the supraclinoid and the distal cavernous segments of the left internal carotid artery.  Moderate stenosis is also noted in the caval cavernous segment of the right internal carotid.  Dysplastic changes  are noted in involving the cavernous ICA segments bilaterally..     Plan for follow-up  Given the patient's above neuroimaging findings, and clinical history it was recommended for better evaluation and characterization of the stenosis of the internal carotid arteries bilaterally, a diagnostic cath arteriogram would be most appropriate.  The procedure, the reasons, the potential complications were extensively reviewed.  Alternatives were also discussed.  The patient expressed her desire to proceed with that diagnostic cath arteriogram in order to optimize management to prevent further ischemic strokes.  Informed patient that our schedulers will call to schedule a diagnostic cath arteriogram of the carotids extracranially and intracranially as soon as possible. All questions answered and concerns addressed. Patient conveys understanding and agrees with plan.  Thank you for this interesting consult.  I greatly enjoyed meeting CALLIE FACEY and look forward to participating in their care.  A copy of this report was sent to the requesting provider on this date.  Electronically Signed: Oneal Grout, MD 03/16/2020, 2:07 PM   I spent a total of 40 minutes in face to face in clinical consultation, greater than 50% of which was counseling/coordinating care for discussing the imaging findings, reviewing the patient's electronic records and answering patient's questions

## 2020-03-17 ENCOUNTER — Telehealth: Payer: Self-pay

## 2020-03-17 NOTE — Telephone Encounter (Signed)
Morrie Sheldon with Dr. Corliss Skains called because the patient is scheduled for an Angiogram on Friday and there is some confusion as to whether or not the patient should she be on plavix. The patient says that she's taking aspirin and plavix, the last OV note only mentions aspirin and atorvastatin, but then patient was also in the hospital. Can you please clarify this and contact Morrie Sheldon back?

## 2020-03-17 NOTE — Telephone Encounter (Addendum)
Ted JM/NP last ofv note on aspirin 81mg  daily.  Last seen [facility] for stroke 02-20-20 Saw Neurology placed in aspirin 81mg  and plavix 75mg .  Has appt Dr. 08-19-2000 04-09-20 per Dr. .

## 2020-03-18 ENCOUNTER — Telehealth (HOSPITAL_COMMUNITY): Payer: Self-pay | Admitting: Radiology

## 2020-03-18 NOTE — Telephone Encounter (Signed)
Patient currently enrolled in BMS trial.  Forwarded information to [facility] research Lindaann Pascal for further input

## 2020-03-18 NOTE — Telephone Encounter (Signed)
Called pt, told her per Dr. Pearlean Brownie and Dr. Corliss Skains to hold her "study" medication from now until her cerebral angiogram is performed on 03/20/20. Patient agrees to this plan of care. JM

## 2020-03-19 ENCOUNTER — Other Ambulatory Visit: Payer: Self-pay | Admitting: Physician Assistant

## 2020-03-20 ENCOUNTER — Encounter (HOSPITAL_COMMUNITY): Payer: Self-pay

## 2020-03-20 ENCOUNTER — Ambulatory Visit (HOSPITAL_COMMUNITY)
Admission: RE | Admit: 2020-03-20 | Discharge: 2020-03-20 | Disposition: A | Discharge status: Home / Self Care | Payer: Medicare PPO | Source: Ambulatory Visit | Attending: Interventional Radiology | Admitting: Interventional Radiology

## 2020-03-20 ENCOUNTER — Other Ambulatory Visit: Payer: Self-pay

## 2020-03-20 ENCOUNTER — Other Ambulatory Visit (HOSPITAL_COMMUNITY): Payer: Self-pay | Admitting: Interventional Radiology

## 2020-03-20 DIAGNOSIS — E538 Deficiency of other specified B group vitamins: Secondary | ICD-10-CM | POA: Insufficient documentation

## 2020-03-20 DIAGNOSIS — F329 Major depressive disorder, single episode, unspecified: Secondary | ICD-10-CM | POA: Diagnosis not present

## 2020-03-20 DIAGNOSIS — Z8673 Personal history of transient ischemic attack (TIA), and cerebral infarction without residual deficits: Secondary | ICD-10-CM | POA: Insufficient documentation

## 2020-03-20 DIAGNOSIS — I671 Cerebral aneurysm, nonruptured: Secondary | ICD-10-CM | POA: Diagnosis not present

## 2020-03-20 DIAGNOSIS — E119 Type 2 diabetes mellitus without complications: Secondary | ICD-10-CM | POA: Diagnosis not present

## 2020-03-20 DIAGNOSIS — Z6841 Body Mass Index (BMI) 40.0 and over, adult: Secondary | ICD-10-CM | POA: Diagnosis not present

## 2020-03-20 DIAGNOSIS — D649 Anemia, unspecified: Secondary | ICD-10-CM | POA: Insufficient documentation

## 2020-03-20 DIAGNOSIS — I63233 Cerebral infarction due to unspecified occlusion or stenosis of bilateral carotid arteries: Secondary | ICD-10-CM | POA: Diagnosis not present

## 2020-03-20 DIAGNOSIS — Z79899 Other long term (current) drug therapy: Secondary | ICD-10-CM | POA: Insufficient documentation

## 2020-03-20 DIAGNOSIS — Z794 Long term (current) use of insulin: Secondary | ICD-10-CM | POA: Insufficient documentation

## 2020-03-20 DIAGNOSIS — I771 Stricture of artery: Secondary | ICD-10-CM

## 2020-03-20 DIAGNOSIS — E669 Obesity, unspecified: Secondary | ICD-10-CM | POA: Insufficient documentation

## 2020-03-20 DIAGNOSIS — E785 Hyperlipidemia, unspecified: Secondary | ICD-10-CM | POA: Diagnosis not present

## 2020-03-20 DIAGNOSIS — I6523 Occlusion and stenosis of bilateral carotid arteries: Secondary | ICD-10-CM | POA: Insufficient documentation

## 2020-03-20 DIAGNOSIS — F419 Anxiety disorder, unspecified: Secondary | ICD-10-CM | POA: Insufficient documentation

## 2020-03-20 DIAGNOSIS — M797 Fibromyalgia: Secondary | ICD-10-CM | POA: Insufficient documentation

## 2020-03-20 DIAGNOSIS — I10 Essential (primary) hypertension: Secondary | ICD-10-CM | POA: Diagnosis not present

## 2020-03-20 HISTORY — PX: IR ANGIO INTRA EXTRACRAN SEL COM CAROTID INNOMINATE BILAT MOD SED: IMG5360

## 2020-03-20 HISTORY — PX: IR ANGIO VERTEBRAL SEL VERTEBRAL BILAT MOD SED: IMG5369

## 2020-03-20 HISTORY — PX: IR US GUIDE VASC ACCESS RIGHT: IMG2390

## 2020-03-20 LAB — PROTIME-INR
INR: 1 (ref 0.8–1.2)
Prothrombin Time: 13.1 seconds (ref 11.4–15.2)

## 2020-03-20 LAB — CBC
HCT: 37.6 % (ref 36.0–46.0)
Hemoglobin: 11.9 g/dL — ABNORMAL LOW (ref 12.0–15.0)
MCH: 28.3 pg (ref 26.0–34.0)
MCHC: 31.6 g/dL (ref 30.0–36.0)
MCV: 89.5 fL (ref 80.0–100.0)
Platelets: 368 10*3/uL (ref 150–400)
RBC: 4.2 MIL/uL (ref 3.87–5.11)
RDW: 14 % (ref 11.5–15.5)
WBC: 7.2 10*3/uL (ref 4.0–10.5)
nRBC: 0 % (ref 0.0–0.2)

## 2020-03-20 LAB — BASIC METABOLIC PANEL
Anion gap: 7 (ref 5–15)
BUN: 31 mg/dL — ABNORMAL HIGH (ref 8–23)
CO2: 22 mmol/L (ref 22–32)
Calcium: 8.9 mg/dL (ref 8.9–10.3)
Chloride: 106 mmol/L (ref 98–111)
Creatinine, Ser: 1.79 mg/dL — ABNORMAL HIGH (ref 0.44–1.00)
GFR calc Af Amer: 33 mL/min — ABNORMAL LOW (ref >60)
GFR calc non Af Amer: 29 mL/min — ABNORMAL LOW (ref >60)
Glucose, Bld: 99 mg/dL (ref 70–99)
Potassium: 3.9 mmol/L (ref 3.5–5.1)
Sodium: 135 mmol/L (ref 135–145)

## 2020-03-20 MED ORDER — LIDOCAINE HCL 1 % IJ SOLN
INTRAMUSCULAR | Status: AC
  Filled 2020-03-20: qty 20

## 2020-03-20 MED ORDER — MIDAZOLAM HCL 2 MG/2ML IJ SOLN
INTRAMUSCULAR | Status: AC | PRN
  Administered 2020-03-20: 1 mg via INTRAVENOUS
  Administered 2020-03-20: 0.5 mg via INTRAVENOUS

## 2020-03-20 MED ORDER — NITROGLYCERIN 1 MG/10 ML FOR IR/CATH LAB
INTRA_ARTERIAL | Status: AC
  Administered 2020-03-20: 200 ug via INTRA_ARTERIAL
  Filled 2020-03-20: qty 10

## 2020-03-20 MED ORDER — FENTANYL CITRATE (PF) 100 MCG/2ML IJ SOLN
INTRAMUSCULAR | Status: AC
  Filled 2020-03-20: qty 2

## 2020-03-20 MED ORDER — MIDAZOLAM HCL 2 MG/2ML IJ SOLN
INTRAMUSCULAR | Status: AC
  Filled 2020-03-20: qty 2

## 2020-03-20 MED ORDER — SODIUM CHLORIDE 0.9 % IV SOLN
INTRAVENOUS | Status: DC
  Administered 2020-03-20: 250 mL via INTRAVENOUS

## 2020-03-20 MED ORDER — VERAPAMIL HCL 2.5 MG/ML IV SOLN
INTRAVENOUS | Status: AC
  Filled 2020-03-20: qty 2

## 2020-03-20 MED ORDER — IOHEXOL 300 MG/ML  SOLN
150.0000 mL | Freq: Once | INTRAMUSCULAR | Status: AC | PRN
  Administered 2020-03-20: 75 mL via INTRA_ARTERIAL

## 2020-03-20 MED ORDER — SODIUM CHLORIDE 0.9 % IV SOLN: INTRAVENOUS | Status: AC

## 2020-03-20 MED ORDER — HEPARIN SODIUM (PORCINE) 1000 UNIT/ML IJ SOLN
INTRAMUSCULAR | Status: AC
  Filled 2020-03-20: qty 1

## 2020-03-20 MED ORDER — FENTANYL CITRATE (PF) 100 MCG/2ML IJ SOLN
INTRAMUSCULAR | Status: AC | PRN
  Administered 2020-03-20: 12.5 ug via INTRAVENOUS
  Administered 2020-03-20: 25 ug via INTRAVENOUS

## 2020-03-20 MED ORDER — VERAPAMIL HCL 2.5 MG/ML IV SOLN: INTRA_ARTERIAL | Status: AC | PRN

## 2020-03-20 NOTE — Sedation Documentation (Signed)
5 Fr Exoseal deployed 

## 2020-03-20 NOTE — H&P (Signed)
Chief Complaint: Patient was seen in consultation today for cerebral arteriogram at the request of Dr Lina Sayre   Supervising Physician: Julieanne Cotton  Patient Status: [facility] - Out-pt  History of Present Illness: Catherine Bautista is a 69 y.o. female   Consult with Dr Corliss Skains per Dr Pearlean Brownie referral 03/16/20 Hx 5 CVAs prior to hospitalization  Admitted to [facility] with symptoms of expressive aphasia and word finding difficulty; minimal weakness Rt hand 02/21/20 Symptoms resolved within 24-48 hrs She underwent an MRI scan which showed an embolic left frontal cortical infarct.  Evidence of a previous left middle cerebral artery territory ischemia was also evident. Head MRA examination revealed bilateral internal carotid artery stenosis in the distal cavernous segment on the left, and the caval cavernous segment on the right. Also noted was dysplastic change involving the cavernous segment of the right internal carotid artery with dysplastic dilatation, and with an aneurysmal outpouching  Denies numbness; tingling; weakness Denies N/V Denies vision changes Speech and word finding is back to wnl for pt  Dr Corliss Skains note: 03/16/20: Patients MRI brain MRA of brain and CT of the brain during the recent admission, and previous studies were reviewed. The most recent MRI reveals a cortical diffusion-weighted abnormality in the left frontal cortical motor strip.  Old ischemic changes involving the left MCA posterior frontal parietal and temporal cortical and subcortical areas with gliosis are also evident.Marland Kitchen MRI of the brain reveals a tapered severe narrowing involving the junction of the supraclinoid and the distal cavernous segments of the left internal carotid artery.  Moderate stenosis is also noted in the caval cavernous segment of the right internal carotid.  Dysplastic changes are noted in involving the cavernous ICA segments bilaterally..  Plan for follow-up: Given the patient's above  neuroimaging findings, and clinical history it was recommended for better evaluation and characterization of the stenosis of the internal carotid arteries bilaterally, a diagnostic cath arteriogram would be most appropriate. The procedure, the reasons, the potential complications were extensively reviewed.  Alternatives were also discussed. The patient expressed her desire to proceed with that diagnostic cath arteriogram in order to optimize management to prevent further ischemic strokes.  Scheduled today for cerebral arteriogram   Past Medical History:  Diagnosis Date  . Anemia   . Anemia, vitamin B12 deficiency   . Depression with anxiety   . Diabetes mellitus   . Dyslipidemia   . Fibromyalgia   . Graves disease   . Hypertension   . Obesity   . Stroke (HCC)    "I've had 3 strokes"    Past Surgical History:  Procedure Laterality Date  . ABDOMINAL HYSTERECTOMY  2007  . CHOLECYSTECTOMY     laproscopic  . EP IMPLANTABLE DEVICE N/A 01/05/2016   Procedure: Loop Recorder Insertion;  Surgeon: Will Jorja Loa, MD;  Location: [facility] INVASIVE CV LAB;  Service: Cardiovascular;  Laterality: N/A;  . GASTRIC BYPASS  1981  . TEE WITHOUT CARDIOVERSION N/A 01/05/2016   Procedure: TRANSESOPHAGEAL ECHOCARDIOGRAM (TEE);  Surgeon: Quintella Reichert, MD;  Location: [facility] ENDOSCOPY;  Service: Cardiovascular;  Laterality: N/A;  . TEE WITHOUT CARDIOVERSION N/A 04/18/2017   Procedure: TRANSESOPHAGEAL ECHOCARDIOGRAM (TEE);  Surgeon: Lewayne Bunting, MD;  Location: [facility] ENDOSCOPY;  Service: Cardiovascular;  Laterality: N/A;  . therapuetic abortion  1986   associated with C-section    Allergies: Canagliflozin, Demerol, Excedrin extra strength [aspirin-acetaminophen-caffeine], Janumet [sitagliptin-metformin hcl], Melatonin, Morphine and related, Novocain [procaine hcl], and Procaine  Medications: Prior to Admission medications  Medication Sig Start Date End Date Taking? Authorizing Provider  atorvastatin  (LIPITOR) 80 MG tablet Take 1 tablet (80 mg total) by mouth at bedtime. 02/22/20  Yes Mikhail, [place], DO  b complex vitamins tablet Take 1 tablet by mouth daily.    Yes [provider]  Cholecalciferol (VITAMIN D3) 50 MCG (2000 UT) TABS Take 2,000 Units by mouth daily.   Yes [provider]  empagliflozin (JARDIANCE) 10 MG TABS tablet Take 10 mg by mouth daily.   Yes [provider]  ferrous sulfate 325 (65 FE) MG tablet Take 325 mg by mouth daily with breakfast.   Yes [provider]  glimepiride (AMARYL) 4 MG tablet Take 4 mg by mouth daily with breakfast.   Yes [provider]  hydrochlorothiazide (HYDRODIURIL) 25 MG tablet Take 1 tablet (25 mg total) by mouth daily. 01/11/19  Yes Joseph Art, DO  Insulin Detemir (LEVEMIR FLEXTOUCH) 100 UNIT/ML Pen Inject 54 Units into the skin at bedtime.    Yes [provider]  Investigational - Study Medication Take 2 tablets by mouth 2 (two) times daily. Study name: BMS [id number] 25 mg, 100 mg, or placebo  Additional study details: Through Dr. Pearlean Brownie   Yes [provider]  loratadine (CLARITIN) 10 MG tablet Take 10 mg by mouth daily.   Yes [provider]  Magnesium 300 MG CAPS Take 300 mg by mouth daily.   Yes [provider]  metFORMIN (GLUCOPHAGE-XR) 750 MG 24 hr tablet Take 750 mg by mouth 2 (two) times a day.   Yes [provider]  neomycin-bacitracin-polymyxin (NEOSPORIN) ointment Apply 1 application topically daily as needed for wound care.   Yes [provider]  OMEGA 3 1200 MG CAPS Take 1,200 mg by mouth at bedtime.    Yes [provider]  Prenatal Vit-Fe Fumarate-FA (PRENATAL PO) Take 1 tablet by mouth daily.   Yes [provider]  ramipril (ALTACE) 10 MG capsule Take 10 mg by mouth daily.     Yes [provider]  STUDY - AXIOMATIC - aspirin 100mg  (PI - Sethi) Take 100 mg by mouth daily.   Yes [provider]    vitamin C (ASCORBIC ACID) 500 MG tablet Take 500 mg by mouth daily.   Yes [provider]  ACCU-CHEK SMARTVIEW test strip USE TO CHECK YOUR BLOOD SUGAR THREE TIMES A DAY DX E13.9 IN VITRO 90 DAYS 06/24/19   [provider]  BD PEN NEEDLE NANO 2ND GEN 32G X 4 MM MISC USE TO TEST BLOOD SUGAR 3 TIMES DAILY 07/01/19   [provider]  empagliflozin (JARDIANCE) 25 MG TABS tablet Take 25 mg by mouth daily.    [provider]     Family History  Problem Relation Age of Onset  . Stroke Mother   . Heart failure Mother   . Lung cancer Father   . Kidney cancer Brother   . Stroke Cousin     Social History   Socioeconomic History  . Marital status: Married    Spouse name: Not on file  . Number of children: Not on file  . Years of education: Not on file  . Highest education level: Not on file  Occupational History  . Not on file  Tobacco Use  . Smoking status: Never Smoker  . Smokeless tobacco: Never Used  Vaping Use  . Vaping Use: Never used  Substance and Sexual Activity  . Alcohol use: No  . Drug use: No  .  Sexual activity: Not on file  Other Topics Concern  . Not on file  Social History Narrative   Lives at home with husband   Right handed   Caffeine: green tea and regular tea, about 24 oz daily   Social Determinants of Health   Financial Resource Strain:   . Difficulty of Paying Living Expenses: Not on file  Food Insecurity:   . Worried About Programme researcher, broadcasting/film/video in the Last Year: Not on file  . Ran Out of Food in the Last Year: Not on file  Transportation Needs:   . Lack of Transportation (Medical): Not on file  . Lack of Transportation (Non-Medical): Not on file  Physical Activity:   . Days of Exercise per Week: Not on file  . Minutes of Exercise per Session: Not on file  Stress:   . Feeling of Stress : Not on file  Social Connections:   . Frequency of Communication with Friends and Family: Not on file  . Frequency of Social  Gatherings with Friends and Family: Not on file  . Attends Religious Services: Not on file  . Active Member of Clubs or Organizations: Not on file  . Attends Banker Meetings: Not on file  . Marital Status: Not on file    Review of Systems: A 12 point ROS discussed and pertinent positives are indicated in the HPI above.  All other systems are negative.  Review of Systems  Constitutional: Negative for activity change, appetite change, fatigue and fever.  HENT: Negative for tinnitus, trouble swallowing and voice change.   Eyes: Negative for visual disturbance.  Respiratory: Negative for shortness of breath.   Cardiovascular: Negative for chest pain and leg swelling.  Gastrointestinal: Negative for abdominal pain.  Musculoskeletal: Negative for back pain.  Neurological: Negative for dizziness, tremors, seizures, syncope, facial asymmetry, speech difficulty, weakness, light-headedness, numbness and headaches.  Psychiatric/Behavioral: Negative for behavioral problems and confusion.    Vital Signs: BP 133/70   Pulse 70   Temp 97.8 F (36.6 C) (Oral)   Resp 16   Ht 5\' 6"  (1.676 m)   Wt 283 lb (128.4 kg)   SpO2 99%   BMI 45.68 kg/m   Physical Exam Vitals reviewed.  Constitutional:      Comments: Face symmetrical Tongue midline  HENT:     Head: Atraumatic.     Mouth/Throat:     Mouth: Mucous membranes are moist.  Eyes:     Extraocular Movements: Extraocular movements intact.  Cardiovascular:     Rate and Rhythm: Normal rate and regular rhythm.     Heart sounds: Normal heart sounds.  Pulmonary:     Effort: Pulmonary effort is normal.     Breath sounds: Normal breath sounds.  Abdominal:     Palpations: Abdomen is soft.     Tenderness: There is no abdominal tenderness.  Musculoskeletal:        General: Normal range of motion.     Cervical back: Normal range of motion.  Skin:    General: Skin is warm.  Neurological:     Mental Status: She is alert and  oriented to person, place, and time.  Psychiatric:        Behavior: Behavior normal.     Imaging: MR ANGIO HEAD WO CONTRAST  Result Date: 02/20/2020 CLINICAL DATA:  Code stroke follow-up EXAM: MRI HEAD WITHOUT CONTRAST MRA HEAD WITHOUT CONTRAST TECHNIQUE: Multiplanar, multiecho pulse sequences of the brain and surrounding structures were obtained without intravenous contrast.  Angiographic images of the head were obtained using MRA technique without contrast. COMPARISON:  08/21/2019 MRI head, 01/08/2019 MRA head FINDINGS: MRI HEAD Brain: There is small area of cortical/subcortical reduced diffusion along the left precentral gyrus at the lateral aspect of the hand motor region. There is some corresponding susceptibility likely reflecting petechial hemorrhage. Chronic left MCA territory infarction centered in the left temporal lobe. Chronic left frontoparietal cortical infarcts. Chronic left thalamic infarct. Minimal additional foci of T2 hyperintensity in the supratentorial white matter likely reflect minor chronic microvascular ischemic changes. There is no intracranial mass or mass effect. There is no hydrocephalus or extra-axial fluid collection. Ventricles are stable in size. Vascular: Major vessel flow voids at the skull base are preserved. Skull and upper cervical spine: Normal marrow signal is preserved. Sinuses/Orbits: Paranasal sinuses are aerated. Orbits are unremarkable. Other: Sella is unremarkable.  Mastoid air cells are clear. MRA HEAD Intracranial internal carotid arteries are patent with atherosclerotic irregularity and moderate stenoses along cavernous and paraclinoid portions. Appearance is similar to the prior study. Middle and anterior cerebral arteries are patent. Intracranial vertebral arteries, basilar artery, posterior cerebral arteries are patent. There is no aneurysm. IMPRESSION: Small acute infarct of the left frontal lobe involving the primary motor cortex. Corresponding  susceptibility reflecting petechial hemorrhage. No proximal intracranial vessel occlusion. Similar bilateral ICA stenoses. Stable additional chronic findings detailed above. Electronically Signed   By: Guadlupe Spanish M.D.   On: 02/20/2020 18:45   MR BRAIN WO CONTRAST  Result Date: 02/22/2020 CLINICAL DATA:  Stroke follow-up EXAM: MRI HEAD WITHOUT CONTRAST TECHNIQUE: Multiplanar, multiecho pulse sequences of the brain and surrounding structures were obtained without intravenous contrast. COMPARISON:  Brain MRI 02/20/2020 FINDINGS: Small acute infarct of the left precentral gyrus. There is an old infarct the left temporal lobe. No acute hemorrhage. There is multifocal periventricular white matter hyperintensity, most often a result of chronic microvascular ischemia. IMPRESSION: Small acute infarct of the left precentral gyrus. Electronically Signed   By: Deatra Robinson M.D.   On: 02/22/2020 00:15   MR BRAIN WO CONTRAST  Result Date: 02/20/2020 CLINICAL DATA:  Code stroke follow-up EXAM: MRI HEAD WITHOUT CONTRAST MRA HEAD WITHOUT CONTRAST TECHNIQUE: Multiplanar, multiecho pulse sequences of the brain and surrounding structures were obtained without intravenous contrast. Angiographic images of the head were obtained using MRA technique without contrast. COMPARISON:  08/21/2019 MRI head, 01/08/2019 MRA head FINDINGS: MRI HEAD Brain: There is small area of cortical/subcortical reduced diffusion along the left precentral gyrus at the lateral aspect of the hand motor region. There is some corresponding susceptibility likely reflecting petechial hemorrhage. Chronic left MCA territory infarction centered in the left temporal lobe. Chronic left frontoparietal cortical infarcts. Chronic left thalamic infarct. Minimal additional foci of T2 hyperintensity in the supratentorial white matter likely reflect minor chronic microvascular ischemic changes. There is no intracranial mass or mass effect. There is no hydrocephalus or  extra-axial fluid collection. Ventricles are stable in size. Vascular: Major vessel flow voids at the skull base are preserved. Skull and upper cervical spine: Normal marrow signal is preserved. Sinuses/Orbits: Paranasal sinuses are aerated. Orbits are unremarkable. Other: Sella is unremarkable.  Mastoid air cells are clear. MRA HEAD Intracranial internal carotid arteries are patent with atherosclerotic irregularity and moderate stenoses along cavernous and paraclinoid portions. Appearance is similar to the prior study. Middle and anterior cerebral arteries are patent. Intracranial vertebral arteries, basilar artery, posterior cerebral arteries are patent. There is no aneurysm. IMPRESSION: Small acute infarct of the left frontal lobe  involving the primary motor cortex. Corresponding susceptibility reflecting petechial hemorrhage. No proximal intracranial vessel occlusion. Similar bilateral ICA stenoses. Stable additional chronic findings detailed above. Electronically Signed   By: Guadlupe Spanish M.D.   On: 02/20/2020 18:45   ECHOCARDIOGRAM COMPLETE BUBBLE STUDY  Result Date: 02/21/2020    ECHOCARDIOGRAM REPORT   Patient Name:   CHANEKA TREFZ Hartland  Date of Exam: 02/21/2020 Medical Rec #:  [id number]  Height:       66.0 in Accession #:    [id number] Weight:       284.4 lb Date of Birth:  1951/01/26 BSA:          2.323 m Patient Age:    68 years   BP:           157/73 mmHg Patient Gender: F          HR:           56 bpm. Exam Location:  Inpatient Procedure: 2D Echo and Saline Contrast Bubble Study Indications:    stroke 434.91  History:        Patient has prior history of Echocardiogram examinations, most                 recent 01/08/2019. Stroke.  Sonographer:    Delcie Roch Referring Phys: [id number] VISHAL R PATEL IMPRESSIONS  1. Left ventricular ejection fraction, by estimation, is 60 to 65%. The left ventricle has normal function. The left ventricle has no regional wall motion abnormalities. Left ventricular diastolic  parameters are indeterminate. There is Septal bounce in diastole.  2. Right ventricular systolic function is normal. The right ventricular size is normal. Tricuspid regurgitation signal is inadequate for assessing PA pressure.  3. Left atrial size was mildly dilated.  4. The mitral valve is normal in structure. Trivial mitral valve regurgitation. No evidence of mitral stenosis.  5. The aortic valve is tricuspid. Aortic valve regurgitation is trivial. Mild aortic valve sclerosis is present, with no evidence of aortic valve stenosis.  6. Aortic dilatation noted. There is mild dilatation of the ascending aorta measuring 40 mm.  7. The inferior vena cava is normal in size with greater than 50% respiratory variability, suggesting right atrial pressure of 3 mmHg.  8. No apparent intraatrial shunt, though difficult images. Comparison(s): No significant change from prior study. Conclusion(s)/Recommendation(s): No intracardiac source of embolism detected on this transthoracic study. A transesophageal echocardiogram is recommended to exclude cardiac source of embolism if clinically indicated. FINDINGS  Left Ventricle: Left ventricular ejection fraction, by estimation, is 60 to 65%. The left ventricle has normal function. The left ventricle has no regional wall motion abnormalities. The left ventricular internal cavity size was normal in size. There is  no left ventricular hypertrophy. Septal bounce in diastole. Left ventricular diastolic parameters are indeterminate. Right Ventricle: The right ventricular size is normal. No increase in right ventricular wall thickness. Right ventricular systolic function is normal. Tricuspid regurgitation signal is inadequate for assessing PA pressure. Left Atrium: Left atrial size was mildly dilated. Right Atrium: Right atrial size was normal in size. Pericardium: Trivial pericardial effusion is present. Mitral Valve: The mitral valve is normal in structure. Mild mitral annular  calcification. Trivial mitral valve regurgitation. No evidence of mitral valve stenosis. Tricuspid Valve: The tricuspid valve is normal in structure. Tricuspid valve regurgitation is trivial. No evidence of tricuspid stenosis. Aortic Valve: The aortic valve is tricuspid. Aortic valve regurgitation is trivial. Mild aortic valve sclerosis is present, with no evidence of aortic valve  stenosis. There is mild calcification of the aortic valve. Pulmonic Valve: The pulmonic valve was not well visualized. Pulmonic valve regurgitation is trivial. Aorta: Aortic dilatation noted. There is mild dilatation of the ascending aorta measuring 40 mm. Venous: The inferior vena cava is normal in size with greater than 50% respiratory variability, suggesting right atrial pressure of 3 mmHg. IAS/Shunts: The atrial septum is grossly normal. Agitated saline contrast was given intravenously to evaluate for intracardiac shunting. No apparent intraatrial shunt, though difficult images.  LEFT VENTRICLE PLAX 2D LVIDd:         4.70 cm     Diastology LVIDs:         3.00 cm     LV e' lateral: 7.83 cm/s LV PW:         1.10 cm     LV e' medial:  5.00 cm/s LV IVS:        1.00 cm LVOT diam:     2.20 cm LVOT Area:     3.80 cm  LV Volumes (MOD) LV vol d, MOD A2C: 72.6 ml LV vol d, MOD A4C: 89.4 ml LV vol s, MOD A2C: 35.3 ml LV vol s, MOD A4C: 38.4 ml LV SV MOD A2C:     37.3 ml LV SV MOD A4C:     89.4 ml LV SV MOD BP:      45.9 ml RIGHT VENTRICLE             IVC RV S prime:     12.70 cm/s  IVC diam: 1.70 cm TAPSE (M-mode): 1.7 cm LEFT ATRIUM             Index       RIGHT ATRIUM           Index LA diam:        3.30 cm 1.42 cm/m  RA Area:     14.30 cm LA Vol (A2C):   70.3 ml 30.27 ml/m RA Volume:   36.00 ml  15.50 ml/m LA Vol (A4C):   75.6 ml 32.55 ml/m LA Biplane Vol: 78.3 ml 33.71 ml/m   AORTA Ao Root diam: 3.90 cm Ao Asc diam:  4.00 cm  SHUNTS Systemic Diam: 2.20 cm Jodelle Red MD Electronically signed by Jodelle Red MD  Signature Date/Time: 02/21/2020/1:14:38 PM    Final    CT HEAD CODE STROKE WO CONTRAST  Result Date: 02/20/2020 CLINICAL DATA:  Code stroke.  Prior CVA, slurred speech and aphasia. EXAM: CT HEAD WITHOUT CONTRAST TECHNIQUE: Contiguous axial images were obtained from the base of the skull through the vertex without intravenous contrast. COMPARISON:  Brain MRI 08/21/2019. FINDINGS: Brain: Stable, mild generalized parenchymal atrophy. Redemonstrated remote left MCA territory cortical/subcortical infarct affecting portions of the left temporal, parietal and occipital lobes as well as posterior left insula. Additional known small chronic cortically based infarcts within the left parietal and frontal lobes as previously demonstrated on the MRI of 08/21/2019. Redemonstrated chronic left thalamic lacunar infarct. There is no evidence of acute intracranial hemorrhage. No acute demarcated cortical infarction is identified. No extra-axial fluid collection. No evidence of intracranial mass. No midline shift. Vascular: No hyperdense vessel.  Atherosclerotic calcifications. Skull: Normal. Negative for fracture or focal lesion. Sinuses/Orbits: Visualized orbits show no acute finding. No significant paranasal sinus disease or mastoid effusion at the imaged levels. ASPECTS [facility] Stroke Program Early CT Score) - Ganglionic level infarction (caudate, lentiform nuclei, internal capsule, insula, M1-M3 cortex): 7 - Supraganglionic infarction (M4-M6 cortex): 3 Total score (0-10  with 10 being normal): 10 (when discounting chronic infarction changes). These results were communicated to Dr. Laurence Slate At 11:56 amon 7/22/2021by text page via the [facility] messaging system. IMPRESSION: No CT evidence of acute intracranial abnormality. ASPECTS is 10. Redemonstrated chronic cortical/subcortical left MCA infarct affecting portions of the left temporal, parietal and occipital lobes as well as posterior left insula. Additional small chronic cortical  infarcts within the left parietal and frontal lobes, unchanged. Redemonstrated chronic left thalamic lacunar infarct. Stable, mild generalized parenchymal atrophy. Electronically Signed   By: Jackey Loge DO   On: 02/20/2020 11:57   VAS US CAROTID (at [facility] and [facility] only)  Result Date: 02/21/2020 Carotid Arterial Duplex Study Indications:       CVA. Comparison Study:  Prior study 01-04-2016 Rt and LT 1-39% stenosis in ICA, mild                    plaque Performing Technologist: Jean Rosenthal  Examination Guidelines: A complete evaluation includes B-mode imaging, spectral Doppler, color Doppler, and power Doppler as needed of all accessible portions of each vessel. Bilateral testing is considered an integral part of a complete examination. Limited examinations for reoccurring indications may be performed as noted.  Right Carotid Findings: +----------+--------+--------+--------+-----------------------+--------+           PSV cm/sEDV cm/sStenosisPlaque Description     Comments +----------+--------+--------+--------+-----------------------+--------+ CCA Prox  109     14              heterogenous and smooth         +----------+--------+--------+--------+-----------------------+--------+ CCA Distal111     17              heterogenous and smooth         +----------+--------+--------+--------+-----------------------+--------+ ICA Prox  64      7       1-39%   heterogenous                    +----------+--------+--------+--------+-----------------------+--------+ ICA Distal58      17                                              +----------+--------+--------+--------+-----------------------+--------+ ECA       117                                                     +----------+--------+--------+--------+-----------------------+--------+ +---------+--------+--+--------+--+ VertebralPSV cm/s47EDV cm/s11 +---------+--------+--+--------+--+  Left Carotid Findings:  +----------+--------+--------+--------+------------------+------------------+           PSV cm/sEDV cm/sStenosisPlaque DescriptionComments           +----------+--------+--------+--------+------------------+------------------+ CCA Prox  112     13                                intimal thickening +----------+--------+--------+--------+------------------+------------------+ CCA Distal91      14                                intimal thickening +----------+--------+--------+--------+------------------+------------------+ ICA Prox  49      9       1-39%   heterogenous                         +----------+--------+--------+--------+------------------+------------------+  ICA Distal47      8                                                    +----------+--------+--------+--------+------------------+------------------+ ECA       115                                                          +----------+--------+--------+--------+------------------+------------------+ +----------+--------+--------+--------+-------------------+           PSV cm/sEDV cm/sDescribeArm Pressure (mmHG) +----------+--------+--------+--------+-------------------+ [id number]                                         +----------+--------+--------+--------+-------------------+ +---------+--------+--+--------+--+ VertebralPSV cm/s75EDV cm/s12 +---------+--------+--+--------+--+   Summary: Right Carotid: Velocities in the right ICA are consistent with a 1-39% stenosis.                Non-hemodynamically significant plaque <50% noted in the CCA. Left Carotid: Velocities in the left ICA are consistent with a 1-39% stenosis.  *See table(s) above for measurements and observations.  Electronically signed by Delia HeadyPramod Sethi MD on 02/21/2020 at 2:19:11 PM.    Final    VAS US LOWER EXTREMITY VENOUS (DVT)  Result Date: 02/21/2020  Lower Venous DVTStudy Indications: Stroke.  Limitations: Pain tolerance.  Comparison Study: 01/04/16 previous Performing Technologist: Blanch MediaMegan Riddle RVS  Examination Guidelines: A complete evaluation includes B-mode imaging, spectral Doppler, color Doppler, and power Doppler as needed of all accessible portions of each vessel. Bilateral testing is considered an integral part of a complete examination. Limited examinations for reoccurring indications may be performed as noted. The reflux portion of the exam is performed with the patient in reverse Trendelenburg.  +---------+---------------+---------+-----------+----------+-------------------+ RIGHT    CompressibilityPhasicitySpontaneityPropertiesThrombus Aging      +---------+---------------+---------+-----------+----------+-------------------+ CFV      Full           Yes      Yes                                      +---------+---------------+---------+-----------+----------+-------------------+ SFJ      Full                                                             +---------+---------------+---------+-----------+----------+-------------------+ FV Prox                 Yes      Yes                  unable to compress  due to patient pain                                                       tolerance           +---------+---------------+---------+-----------+----------+-------------------+ FV Mid                  Yes      Yes                  unable to compress                                                        due to patient pain                                                       tolerance           +---------+---------------+---------+-----------+----------+-------------------+ FV DistalFull                                                             +---------+---------------+---------+-----------+----------+-------------------+ PFV      Full                                                              +---------+---------------+---------+-----------+----------+-------------------+ POP      Full           Yes      Yes                                      +---------+---------------+---------+-----------+----------+-------------------+ PTV      Full                                                             +---------+---------------+---------+-----------+----------+-------------------+ PERO     Full                                                             +---------+---------------+---------+-----------+----------+-------------------+   +---------+---------------+---------+-----------+----------+-------------------+ LEFT     CompressibilityPhasicitySpontaneityPropertiesThrombus Aging      +---------+---------------+---------+-----------+----------+-------------------+ CFV      Full  Yes      Yes                                      +---------+---------------+---------+-----------+----------+-------------------+ SFJ      Full                                                             +---------+---------------+---------+-----------+----------+-------------------+ FV Prox  Full                                                             +---------+---------------+---------+-----------+----------+-------------------+ FV Mid                  Yes      Yes                  unable to compress                                                        due to patient pain                                                       tolerance           +---------+---------------+---------+-----------+----------+-------------------+ FV Distal               Yes      Yes                  unable to compress                                                        due to patient pain                                                       tolerance           +---------+---------------+---------+-----------+----------+-------------------+  PFV      Full                                                             +---------+---------------+---------+-----------+----------+-------------------+ POP      Full  Yes      Yes                                      +---------+---------------+---------+-----------+----------+-------------------+ PTV      Full                                                             +---------+---------------+---------+-----------+----------+-------------------+ PERO     Full                                                             +---------+---------------+---------+-----------+----------+-------------------+     Summary: BILATERAL: - No evidence of deep vein thrombosis seen in the lower extremities, bilaterally. - No evidence of superficial venous thrombosis in the lower extremities, bilaterally. -   *See table(s) above for measurements and observations. Electronically signed by Sherald Hess MD on 02/21/2020 at 12:47:17 PM.    Final     Labs:  CBC: Recent Labs    08/14/19 1339 02/20/20 1139 02/20/20 1141  WBC 7.8 6.7  --   HGB 11.5 11.6* 11.9*  HCT 35.8 37.4 35.0*  PLT 332 303  --     COAGS: Recent Labs    02/20/20 1139  INR 1.0  APTT 27    BMP: Recent Labs    08/14/19 1339 02/20/20 1139 02/20/20 1141 02/21/20 0502  NA 141 136 137 136  K 4.4 4.2 4.0 4.2  CL 107* 104 105 106  CO2 24 22  --  23  GLUCOSE 123* 187* 185* 152*  BUN 15 30* 32* 25*  CALCIUM 9.0 8.7*  --  8.8*  CREATININE 1.09* 1.67* 1.80* 1.35*  GFRNONAA 53* 31*  --  41*  GFRAA 61 36*  --  47*    LIVER FUNCTION TESTS: Recent Labs    08/14/19 1339 02/20/20 1139  BILITOT 0.3 0.9  AST 27 18  ALT 38* 23  ALKPHOS 131* 95  PROT 6.5 6.6  ALBUMIN 3.8 3.4*    TUMOR MARKERS: No results for input(s): AFPTM, CEA, CA199, CHROMGRNA in the last 8760 hours.  Assessment and Plan:  CVA Bilateral carotid artery stenosis per imaging Seen in consultation with Dr  Corliss Skains Scheduled for cerebral arteriogram in IR Risks and benefits of cerebral angiogram with intervention were discussed with the patient including, but not limited to bleeding, infection, vascular injury, contrast induced renal failure, stroke or even death.  This interventional procedure involves the use of X-rays and because of the nature of the planned procedure, it is possible that we will have prolonged use of X-ray fluoroscopy.  Potential radiation risks to you include (but are not limited to) the following: - A slightly elevated risk for cancer  several years later in life. This risk is typically less than 0.5% percent. This risk is low in comparison to the normal incidence of human cancer, which is 33% for women and 50% for men according to the [employer]. - Radiation induced injury can include skin redness, resembling a rash, tissue breakdown / ulcers  and hair loss (which can be temporary or permanent).   The likelihood of either of these occurring depends on the difficulty of the procedure and whether you are sensitive to radiation due to previous procedures, disease, or genetic conditions.   IF your procedure requires a prolonged use of radiation, you will be notified and given written instructions for further action.  It is your responsibility to monitor the irradiated area for the 2 weeks following the procedure and to notify your physician if you are concerned that you have suffered a radiation induced injury.    All of the patient's questions were answered, patient is agreeable to proceed.  Consent signed and in chart.  Thank you for this interesting consult.  I greatly enjoyed meeting MALEIAH DULA and look forward to participating in their care.  A copy of this report was sent to the requesting provider on this date.  Electronically Signed: Robet Leu, PA-C 03/20/2020, 7:39 AM   I spent a total of  30 Minutes   in face to face in clinical consultation,  greater than 50% of which was counseling/coordinating care for cerebral arteriogram

## 2020-03-20 NOTE — Progress Notes (Signed)
Up and walked and tolerated well; right groin stable, no bleeding or hematoma 

## 2020-03-20 NOTE — Procedures (Signed)
S/P  4 vessel cerebral arteriogram RT rad and RT CFA approach. Findings. 1.Appro 40 to 45 % stenosis of RT ICA caval cav seg. 2.Approx 30 to 40 % stenosis of Lt ICA distal cav seg. 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 S.Kymberly Blomberg MD

## 2020-03-20 NOTE — Discharge Instructions (Signed)
NO METFORMIN/ GLUCOPHAGE FOR 2 DAYS   Radial Site Care  This sheet gives you information about how to care for yourself after your procedure. Your health care provider may also give you more specific instructions. If you have problems or questions, contact your health care provider. What can I expect after the procedure? After the procedure, it is common to have:  Bruising and tenderness at the catheter insertion area. Follow these instructions at home: Medicines  Take over-the-counter and prescription medicines only as told by your health care provider. Insertion site care  Follow instructions from your health care provider about how to take care of your insertion site. Make sure you: ? Wash your hands with soap and water before you change your bandage (dressing). If soap and water are not available, use hand sanitizer. ? Change your dressing as told by your health care provider. ? Leave stitches (sutures), skin glue, or adhesive strips in place. These skin closures may need to stay in place for 2 weeks or longer. If adhesive strip edges start to loosen and curl up, you may trim the loose edges. Do not remove adhesive strips completely unless your health care provider tells you to do that.  Check your insertion site every day for signs of infection. Check for: ? Redness, swelling, or pain. ? Fluid or blood. ? Pus or a bad smell. ? Warmth.  Do not take baths, swim, or use a hot tub until your health care provider approves.  You may shower 24-48 hours after the procedure, or as directed by your health care provider. ? Remove the dressing and gently wash the site with plain soap and water. ? Pat the area dry with a clean towel. ? Do not rub the site. That could cause bleeding.  Do not apply powder or lotion to the site. Activity   For 24 hours after the procedure, or as directed by your health care provider: ? Do not flex or bend the affected arm. ? Do not push or pull heavy  objects with the affected arm. ? Do not drive yourself home from the hospital or clinic. You may drive 24 hours after the procedure unless your health care provider tells you not to. ? Do not operate machinery or power tools.  Do not lift anything that is heavier than 10 lb (4.5 kg), or the limit that you are told, until your health care provider says that it is safe.  Ask your health care provider when it is okay to: ? Return to work or school. ? Resume usual physical activities or sports. ? Resume sexual activity. General instructions  If the catheter site starts to bleed, raise your arm and put firm pressure on the site. If the bleeding does not stop, get help right away. This is a medical emergency.  If you went home on the same day as your procedure, a responsible adult should be with you for the first 24 hours after you arrive home.  Keep all follow-up visits as told by your health care provider. This is important. Contact a health care provider if:  You have a fever.  You have redness, swelling, or yellow drainage around your insertion site. Get help right away if:  You have unusual pain at the radial site.  The catheter insertion area swells very fast.  The insertion area is bleeding, and the bleeding does not stop when you hold steady pressure on the area.  Your arm or hand becomes pale, cool, tingly, or  numb. These symptoms may represent a serious problem that is an emergency. Do not wait to see if the symptoms will go away. Get medical help right away. Call your local emergency services (911 in the [place] Do not drive yourself to the hospital. Summary  After the procedure, it is common to have bruising and tenderness at the site.  Follow instructions from your health care provider about how to take care of your radial site wound. Check the wound every day for signs of infection.  Do not lift anything that is heavier than 10 lb (4.5 kg), or the limit that you are told,  until your health care provider says that it is safe. This information is not intended to replace advice given to you by your health care provider. Make sure you discuss any questions you have with your health care provider. Document Revised: 08/23/2017 Document Reviewed: 08/23/2017 Elsevier Patient Education  2020 [employer]   Femoral Site Care This sheet gives you information about how to care for yourself after your procedure. Your health care provider may also give you more specific instructions. If you have problems or questions, contact your health care provider. What can I expect after the procedure? After the procedure, it is common to have:  Bruising that usually fades within 1-2 weeks.  Tenderness at the site. Follow these instructions at home: Wound care  Follow instructions from your health care provider about how to take care of your insertion site. Make sure you: ? Wash your hands with soap and water before you change your bandage (dressing). If soap and water are not available, use hand sanitizer. ? Change your dressing as told by your health care provider. ? Leave stitches (sutures), skin glue, or adhesive strips in place. These skin closures may need to stay in place for 2 weeks or longer. If adhesive strip edges start to loosen and curl up, you may trim the loose edges. Do not remove adhesive strips completely unless your health care provider tells you to do that.  Do not take baths, swim, or use a hot tub until your health care provider approves.  You may shower 24-48 hours after the procedure or as told by your health care provider. ? Gently wash the site with plain soap and water. ? Pat the area dry with a clean towel. ? Do not rub the site. This may cause bleeding.  Do not apply powder or lotion to the site. Keep the site clean and dry.  Check your femoral site every day for signs of infection. Check for: ? Redness, swelling, or pain. ? Fluid or  blood. ? Warmth. ? Pus or a bad smell. Activity  For the first 2-3 days after your procedure, or as long as directed: ? Avoid climbing stairs as much as possible. ? Do not squat.  Do not lift anything that is heavier than 10 lb (4.5 kg), or the limit that you are told, until your health care provider says that it is safe.  Rest as directed. ? Avoid sitting for a long time without moving. Get up to take short walks every 1-2 hours.  Do not drive for 24 hours if you were given a medicine to help you relax (sedative). General instructions  Take over-the-counter and prescription medicines only as told by your health care provider.  Keep all follow-up visits as told by your health care provider. This is important. Contact a health care provider if you have:  A fever or chills.  You have redness, swelling, or pain around your insertion site. Get help right away if:  The catheter insertion area swells very fast.  You pass out.  You suddenly start to sweat or your skin gets clammy.  The catheter insertion area is bleeding, and the bleeding does not stop when you hold steady pressure on the area.  The area near or just beyond the catheter insertion site becomes pale, cool, tingly, or numb. These symptoms may represent a serious problem that is an emergency. Do not wait to see if the symptoms will go away. Get medical help right away. Call your local emergency services (911 in the [place] Do not drive yourself to the hospital. Summary  After the procedure, it is common to have bruising that usually fades within 1-2 weeks.  Check your femoral site every day for signs of infection.  Do not lift anything that is heavier than 10 lb (4.5 kg), or the limit that you are told, until your health care provider says that it is safe. This information is not intended to replace advice given to you by your health care provider. Make sure you discuss any questions you have with your health care  provider. Document Revised: 07/31/2017 Document Reviewed: 07/31/2017 Elsevier Patient Education  2020 [employer]    Cerebral Angiogram, Care After This sheet gives you information about how to care for yourself after your procedure. Your health care provider may also give you more specific instructions. If you have problems or questions, contact your health care provider. What can I expect after the procedure? After the procedure, it is common to have:  Bruising and tenderness at the catheter insertion site.  A mild headache. Follow these instructions at home: Insertion site care  Follow instructions from your health care provider about how to take care of the insertion site. Make sure you: ? Wash your hands with soap and water before and after you change your bandage (dressing). If soap and water are not available, use hand sanitizer. ? Change your dressing as told by your health care provider.  Do not take baths, swim, or use a hot tub until your health care provider approves. You may shower 24-48 hours after the procedure, or as told by your health care provider.  To clean your insertion site: ? Gently wash the site with plain soap and water. ? Pat the area dry with a clean towel. ? Do not rub the site. This may cause bleeding.  Do not apply powder or lotion to the site. Keep the site clean and dry. Infection signs Check your incision area every day for signs of infection. Check for:  Redness, swelling, or pain.  Fluid or blood.  Warmth.  Pus or a bad smell.  Activity  Do not drive for 24 hours if you were given a sedative during your procedure.  Rest as told by your health care provider.  Do not lift anything that is heavier than 10 lb (4.5 kg), or the limit that you are told, until your health care provider says that it is safe.  Return to your normal activities as told by your health care provider, usually in about a week. Ask your health care provider what  activities are safe for you. General instructions   If your insertion site starts to bleed, lie flat and put pressure on the site. If the bleeding does not stop, get help right away. This is a medical emergency.  Do not use any products that contain  nicotine or tobacco, such as cigarettes, e-cigarettes, and chewing tobacco. If you need help quitting, ask your health care provider.  Take over-the-counter and prescription medicines only as told by your health care provider.  Drink enough fluid to keep your urine pale yellow. This helps flush the contrast dye from your body.  Keep all follow-up visits as directed by your health care provider. This is important. Contact a health care provider if:  You have a fever or chills.  You have redness, swelling, or pain around your insertion site.  You have fluid or blood coming from your insertion site.  The insertion site feels warm to the touch.  You have pus or a bad smell coming from your insertion site.  You notice blood collecting in the tissue around the insertion site (hematoma). The hematoma may be painful to the touch. Get help right away if:  You have chest pain or trouble breathing.  You have severe pain or swelling at the insertion site.  The insertion area bleeds, and bleeding continues after 30 minutes of holding steady pressure on the site.  The arm or leg where the catheter was inserted is pale, cold, numb, tingling, or weak.  You have a rash.  You have any symptoms of a stroke. "BE FAST" is an easy way to remember the main warning signs of a stroke: ? B - Balance. Signs are dizziness, sudden trouble walking, or loss of balance. ? E - Eyes. Signs are trouble seeing or a sudden change in vision. ? F - Face. Signs are sudden weakness or numbness of the face, or the face or eyelid drooping on one side. ? A - Arms. Signs are weakness or numbness in an arm. This happens suddenly and usually on one side of the body. ? S -  Speech. Signs are sudden trouble speaking, slurred speech, or trouble understanding what people say. ? T - Time. Time to call emergency services. Write down what time symptoms started.  You have other signs of a stroke, such as: ? A sudden, severe headache with no known cause. ? Nausea or vomiting. ? Seizure. These symptoms may represent a serious problem that is an emergency. Do not wait to see if the symptoms will go away. Get medical help right away. Call your local emergency services (911 in the [place] Do not drive yourself to the hospital. Summary  Bruising and tenderness at the insertion site are common.  Follow your health care provider's instructions about caring for your insertion site. Change dressing and clean the area as instructed.  If your insertion site bleeds, apply direct pressure until bleeding stops.  Return to your normal activities as told by your health care provider. Ask what activities are safe.  Rest and drink plenty of fluids. This information is not intended to replace advice given to you by your health care provider. Make sure you discuss any questions you have with your health care provider. Document Revised: 02/05/2019 Document Reviewed: 02/05/2019 Elsevier Patient Education  2020 [employer].

## 2020-03-20 NOTE — Progress Notes (Signed)
Patient having difficult time voiding using the female external catheter (Pur wick) and states she does not wish to use the bed pan. Paged the PA and received order for in and out cath. Patient agrees to in and out cath.

## 2020-03-24 ENCOUNTER — Other Ambulatory Visit: Payer: Self-pay

## 2020-03-24 ENCOUNTER — Encounter: Payer: Medicare PPO | Attending: Internal Medicine | Admitting: Nutrition

## 2020-03-24 DIAGNOSIS — Z794 Long term (current) use of insulin: Secondary | ICD-10-CM | POA: Diagnosis not present

## 2020-03-24 DIAGNOSIS — E1165 Type 2 diabetes mellitus with hyperglycemia: Secondary | ICD-10-CM

## 2020-03-25 ENCOUNTER — Encounter: Payer: Self-pay | Admitting: Nutrition

## 2020-03-25 NOTE — Progress Notes (Signed)
Catherine Bautista was identified by name and DOB.  She says she is here for a review of her diet, to help her loose weight, achieve better blood sugar control.  Says since her last stroke 2 weeks ago, she has reduced her carbs, and is eating more vegetables  and believes she has lost weight.  Wants specifics on how much she should be eating, with the goal of coming off the insulin.  Says is testing her blood sugar 3-4X/day, but does not know what it should be.  Has significantly reduced family stresses on her in last 2 weeks, family always asking her to do things for them.  Says is going to devote more time to herself. Medication dose: Levemir: 54u q HS, Jardiance 25mg ., Glimeperide 4mg . Meformin 750 BID SBGM: 65-80 acB (waking up with sweating and feeling weak "3-4X/wk.)  Treats with juice and peanut butter. 82, acL today,  100-120 acS, HS 200s (2-3 hr. Pc) Exercise: used to walk, has not started back since last stroke.  Wants to go to the gym and do water aerobics.  Says will start this next week.   Diet:  Typical day: 7AM: 20 min. Medication  8:30 up  9:30 first meal: smoothie with greens, fruit (30-40 carbs), and oat milk, or 2-3 egg  Whites with veg., cookies in olive oil, and 1 toast with vegan spread butter. 12:30-1 lunch: salad with , or chicken, dressing, and unsweet tea or water to drink, fruit, or sandwich with fruit and raw veg. 4PM: rarely eats fruit for snack of small tangelo  6-8PM: 3-4 ounces protein, no read meat, but pork, and 2-3 veg., usually one is starchy (corn or peas), has given up potatoes. Discussion: 1. Progression of type 2 diabetes, and the need for insullin.  Also discussion of how the 3 macronutrients (carbs, protein, and fat) affect blood sugar, and the need for all 3 food groups in each meal, with limited fat for calorie control 2.  Need to reduce insulin dose, to prevent low  blood sugar control, which can be dangerous, and cause weight gain. 3.  Need for exercise to  help to decrease insulin resistance, and aid in weight reduction.  But must first clear this with MD, since she is forbidden to go up/down stairs for 2 weeks at this time. 4.  Discussed ways to reduce fat, when eating out and in meals.  Meal plan given for 1800 calories with 1 fat serving and 15-30 grams of carb at each meal and 1 snack.  Reviewed this and handouts given for Meat portions sizes for each meal, balancing meals, blood sugar goals,  Reading labels, appropriate snack sizes. 5. Possibility that Levemir is not coving her blood sugar control for 24 hours.  Discussed other insulin- that may help with this, or the need to use Humalog to cover HS blood sugar rise.  She was told to discuss this with Dr. Malawi at next visit.  Continue to monitor reaidngs at HS.

## 2020-03-25 NOTE — Patient Instructions (Signed)
1.  Please reduce Levemir dose from 54 to 50u, or call Dr. Idelle Crouch office today to let him know of low blood sugars acB and acL 2  Follow 1600-1800 calorie meal plan as dicussed, and call if questions 3.  Clear with family doctor before beginning water aerobics or walking program.   4. Need for more carbs (30-45 grams ) at before meal prior to exercise 5.  Return 4 weeks.

## 2020-03-26 DIAGNOSIS — G4730 Sleep apnea, unspecified: Secondary | ICD-10-CM | POA: Diagnosis not present

## 2020-03-26 DIAGNOSIS — E1169 Type 2 diabetes mellitus with other specified complication: Secondary | ICD-10-CM | POA: Diagnosis not present

## 2020-03-26 DIAGNOSIS — Z8673 Personal history of transient ischemic attack (TIA), and cerebral infarction without residual deficits: Secondary | ICD-10-CM | POA: Diagnosis not present

## 2020-03-26 DIAGNOSIS — Z7984 Long term (current) use of oral hypoglycemic drugs: Secondary | ICD-10-CM | POA: Diagnosis not present

## 2020-03-26 DIAGNOSIS — E039 Hypothyroidism, unspecified: Secondary | ICD-10-CM | POA: Diagnosis not present

## 2020-04-09 ENCOUNTER — Institutional Professional Consult (permissible substitution): Payer: Medicare PPO | Admitting: Neurology

## 2020-04-21 ENCOUNTER — Ambulatory Visit: Payer: Medicare PPO | Admitting: Nutrition

## 2020-05-11 DIAGNOSIS — G4733 Obstructive sleep apnea (adult) (pediatric): Secondary | ICD-10-CM | POA: Diagnosis not present

## 2020-05-15 DIAGNOSIS — Z1231 Encounter for screening mammogram for malignant neoplasm of breast: Secondary | ICD-10-CM | POA: Diagnosis not present

## 2020-05-20 ENCOUNTER — Other Ambulatory Visit: Payer: Self-pay | Admitting: Neurology

## 2020-05-20 DIAGNOSIS — I6300 Cerebral infarction due to thrombosis of unspecified precerebral artery: Secondary | ICD-10-CM

## 2020-05-25 DIAGNOSIS — G4733 Obstructive sleep apnea (adult) (pediatric): Secondary | ICD-10-CM | POA: Diagnosis not present

## 2020-05-26 DIAGNOSIS — E1169 Type 2 diabetes mellitus with other specified complication: Secondary | ICD-10-CM | POA: Diagnosis not present

## 2020-05-26 DIAGNOSIS — R490 Dysphonia: Secondary | ICD-10-CM | POA: Diagnosis not present

## 2020-05-30 ENCOUNTER — Ambulatory Visit (HOSPITAL_COMMUNITY)
Admission: RE | Admit: 2020-05-30 | Discharge: 2020-05-30 | Disposition: A | Discharge status: Home / Self Care | Payer: Self-pay | Source: Ambulatory Visit | Attending: Neurology | Admitting: Neurology

## 2020-05-30 ENCOUNTER — Other Ambulatory Visit: Payer: Self-pay

## 2020-05-30 DIAGNOSIS — Z8673 Personal history of transient ischemic attack (TIA), and cerebral infarction without residual deficits: Secondary | ICD-10-CM | POA: Diagnosis not present

## 2020-05-30 DIAGNOSIS — G9389 Other specified disorders of brain: Secondary | ICD-10-CM | POA: Diagnosis not present

## 2020-06-12 NOTE — Progress Notes (Signed)
Kindly inform the patient her MRI scan of the brain appears stable and shows recent stroke she had in July showing expected changes.  No new or worrisome finding

## 2020-06-16 ENCOUNTER — Encounter: Payer: Self-pay | Admitting: *Deleted

## 2020-06-23 ENCOUNTER — Other Ambulatory Visit (HOSPITAL_BASED_OUTPATIENT_CLINIC_OR_DEPARTMENT_OTHER): Payer: Self-pay

## 2020-06-23 DIAGNOSIS — G4733 Obstructive sleep apnea (adult) (pediatric): Secondary | ICD-10-CM

## 2020-07-09 DIAGNOSIS — E1169 Type 2 diabetes mellitus with other specified complication: Secondary | ICD-10-CM | POA: Diagnosis not present

## 2020-07-09 DIAGNOSIS — R490 Dysphonia: Secondary | ICD-10-CM | POA: Diagnosis not present

## 2020-07-09 DIAGNOSIS — I10 Essential (primary) hypertension: Secondary | ICD-10-CM | POA: Diagnosis not present

## 2020-07-09 DIAGNOSIS — Z794 Long term (current) use of insulin: Secondary | ICD-10-CM | POA: Diagnosis not present

## 2020-07-09 DIAGNOSIS — E7800 Pure hypercholesterolemia, unspecified: Secondary | ICD-10-CM | POA: Diagnosis not present

## 2020-07-09 DIAGNOSIS — Z8673 Personal history of transient ischemic attack (TIA), and cerebral infarction without residual deficits: Secondary | ICD-10-CM | POA: Diagnosis not present

## 2020-07-12 ENCOUNTER — Other Ambulatory Visit: Payer: Self-pay

## 2020-07-12 ENCOUNTER — Ambulatory Visit (HOSPITAL_BASED_OUTPATIENT_CLINIC_OR_DEPARTMENT_OTHER): Payer: Medicare PPO | Attending: Internal Medicine | Admitting: Internal Medicine

## 2020-07-12 DIAGNOSIS — G4733 Obstructive sleep apnea (adult) (pediatric): Secondary | ICD-10-CM | POA: Diagnosis not present

## 2020-07-20 DIAGNOSIS — G4733 Obstructive sleep apnea (adult) (pediatric): Secondary | ICD-10-CM | POA: Diagnosis not present

## 2020-07-20 NOTE — Procedures (Signed)
NAME: Catherine Bautista DATE OF BIRTH:  07-14-1951 MEDICAL RECORD NUMBER [id number]  LOCATION: [facility]  PHYSICIAN: Deretha Emory  DATE OF STUDY: 07/12/2020  SLEEP STUDY TYPE: Positive Airway Pressure Titration               REFERRING PHYSICIAN: Deretha Emory, MD  EPWORTH SLEEPINESS SCORE:  11 HEIGHT: 5\' 6"  (167.6 cm)  WEIGHT: 274 lb (124.3 kg)    Body mass index is 44.22 kg/m.  NECK SIZE: 15 in.  CLINICAL INFORMATION The patient was referred to the sleep center for CPAP titration. She had a recent stroke, from which she has recovered well. However, she is having more problems being successful at using her CPAP all night. Therefore, PAP study in the lab was recommended. Her current settings are APAP 4-16. Her 95%ile pressure is 12.3 and her AHI is 2.9 events/hour. Study was ordered on amara medium, but other masks were fitted as well.   MEDICATIONS Patient self administered medications include: LEVEMIR, METFORMIN, OMEGA-3, ATORVASTATIN. No sleep medications administered during study.  SLEEP STUDY TECHNIQUE The patient underwent an attended overnight polysomnography titration to assess the effects of cpap therapy. The following variables were monitored: EEG(C4-A1, C3-A2, O1-A2, O2-A1), EOG, submental and leg EMG, ECG, oxyhemoglobin saturation by pulse oximetry, thoracic and abdominal respiratory effort belts, nasal/oral airflow by pressure sensor, body position sensor and snoring sensor. CPAP pressure was titrated to eliminate apneas, hypopneas and oxygen desaturation.  TECHNICAL COMMENTS Comments added by Technician: SIMPLUS FULL FACE MEDIUM MASK WAS USED FOR THIS STUDY Comments added by Scorer: N/A  SLEEP ARCHITECTURE The study was initiated at 9:49:25 PM and terminated at 4:55:35 AM. Total recorded time was 426.2 minutes. EEG confirmed total sleep time was 296.5 minutes yielding a sleep efficiency of 69.6%%. Sleep onset after lights out was 33.7 minutes with  a REM latency of 61.5 minutes. The patient spent 1.5%% of the night in stage N1 sleep, 77.9%% in stage N2 sleep, 0.0%% in stage N3 and 20.6% in REM. The Arousal Index was 4.2/hour.  RESPIRATORY PARAMETERS The overall AHI was 0.4 per hour, and the RDI was 0.4 events/hour with a central apnea index of 0.2per hour. The most appropriate setting of CPAP was IPAP/EPAP 12/12 cm H2O. At this setting, the sleep efficiency was 96% and the patient was supine for 0%. The AHI was 0.0 events per hour, and the RDI was 0.0 events/hour; the arousal index was 2.4 per hour.The oxygen nadir was 95.0% during sleep at optimal pressure. A Medium size [employer] Full Face Mask Simplus mask was used in the study.   The patient reported that her sleep during this study was better than her usual sleep at home.   LEG MOVEMENT DATA The total leg movements were 0 with a resulting leg movement index of 0.0/hr. Associated arousal with leg movement index was 0.0/hr.  CARDIAC DATA The underlying cardiac rhythm was most consistent with sinus rhythm. Mean heart rate during sleep was 56.0 bpm. Additional rhythm abnormalities include None.  IMPRESSIONS - Obstructive Sleep apnea(OSA). - Optimal CPAP titration.  - No Central Sleep Apnea (CSA) developed despite h/o stroke  DIAGNOSIS - Obstructive Sleep Apnea (G47.33)  RECOMMENDATIONS - Continue use of APAP 4-16 or use of CPAP therapy at 12 cm H2O.  - Mask used in this study was a medium sized Fisher[employer]Paykel Full Face Mask Simplus mask   Sleep specialist, American Board of Internal Medicine  ELECTRONICALLY SIGNED ON:  07/20/2020, 8:37  [facility] PH: [phone]   FX: [phone] ACCREDITED BY THE AMERICAN AC[employer]EMY OF SLEEP MEDICINE

## 2020-07-29 DIAGNOSIS — Z8673 Personal history of transient ischemic attack (TIA), and cerebral infarction without residual deficits: Secondary | ICD-10-CM | POA: Diagnosis not present

## 2020-07-29 DIAGNOSIS — Z7902 Long term (current) use of antithrombotics/antiplatelets: Secondary | ICD-10-CM | POA: Diagnosis not present

## 2020-07-29 DIAGNOSIS — J383 Other diseases of vocal cords: Secondary | ICD-10-CM | POA: Diagnosis not present

## 2020-07-29 DIAGNOSIS — K219 Gastro-esophageal reflux disease without esophagitis: Secondary | ICD-10-CM | POA: Diagnosis not present

## 2020-08-05 DIAGNOSIS — J383 Other diseases of vocal cords: Secondary | ICD-10-CM | POA: Diagnosis not present

## 2020-08-05 DIAGNOSIS — R490 Dysphonia: Secondary | ICD-10-CM | POA: Diagnosis not present

## 2020-08-09 DIAGNOSIS — Z03818 Encounter for observation for suspected exposure to other biological agents ruled out: Secondary | ICD-10-CM | POA: Diagnosis not present

## 2020-08-09 DIAGNOSIS — J069 Acute upper respiratory infection, unspecified: Secondary | ICD-10-CM | POA: Diagnosis not present

## 2020-08-09 DIAGNOSIS — M791 Myalgia, unspecified site: Secondary | ICD-10-CM | POA: Diagnosis not present

## 2020-08-09 DIAGNOSIS — R0981 Nasal congestion: Secondary | ICD-10-CM | POA: Diagnosis not present

## 2020-08-21 DIAGNOSIS — G4733 Obstructive sleep apnea (adult) (pediatric): Secondary | ICD-10-CM | POA: Diagnosis not present

## 2020-09-03 DIAGNOSIS — H2513 Age-related nuclear cataract, bilateral: Secondary | ICD-10-CM | POA: Diagnosis not present

## 2020-09-03 DIAGNOSIS — H52223 Regular astigmatism, bilateral: Secondary | ICD-10-CM | POA: Diagnosis not present

## 2020-09-03 DIAGNOSIS — H5213 Myopia, bilateral: Secondary | ICD-10-CM | POA: Diagnosis not present

## 2020-09-03 DIAGNOSIS — H524 Presbyopia: Secondary | ICD-10-CM | POA: Diagnosis not present

## 2020-09-03 DIAGNOSIS — H25013 Cortical age-related cataract, bilateral: Secondary | ICD-10-CM | POA: Diagnosis not present

## 2020-09-03 DIAGNOSIS — E119 Type 2 diabetes mellitus without complications: Secondary | ICD-10-CM | POA: Diagnosis not present

## 2020-09-09 DIAGNOSIS — J383 Other diseases of vocal cords: Secondary | ICD-10-CM | POA: Diagnosis not present

## 2020-09-16 DIAGNOSIS — J383 Other diseases of vocal cords: Secondary | ICD-10-CM | POA: Diagnosis not present

## 2020-09-21 DIAGNOSIS — E0500 Thyrotoxicosis with diffuse goiter without thyrotoxic crisis or storm: Secondary | ICD-10-CM | POA: Diagnosis not present

## 2020-09-22 DIAGNOSIS — R42 Dizziness and giddiness: Secondary | ICD-10-CM | POA: Diagnosis not present

## 2020-09-22 DIAGNOSIS — I639 Cerebral infarction, unspecified: Secondary | ICD-10-CM | POA: Diagnosis not present

## 2020-09-23 DIAGNOSIS — J383 Other diseases of vocal cords: Secondary | ICD-10-CM | POA: Diagnosis not present

## 2020-09-29 DIAGNOSIS — R42 Dizziness and giddiness: Secondary | ICD-10-CM | POA: Diagnosis not present

## 2020-09-29 DIAGNOSIS — N1831 Chronic kidney disease, stage 3a: Secondary | ICD-10-CM | POA: Diagnosis not present

## 2020-10-02 DIAGNOSIS — E875 Hyperkalemia: Secondary | ICD-10-CM | POA: Diagnosis not present

## 2020-10-13 DIAGNOSIS — I951 Orthostatic hypotension: Secondary | ICD-10-CM | POA: Diagnosis not present

## 2020-10-13 DIAGNOSIS — M5430 Sciatica, unspecified side: Secondary | ICD-10-CM | POA: Diagnosis not present

## 2020-10-23 ENCOUNTER — Other Ambulatory Visit: Payer: Self-pay | Admitting: Neurology

## 2020-10-27 DIAGNOSIS — R490 Dysphonia: Secondary | ICD-10-CM | POA: Diagnosis not present

## 2020-10-27 DIAGNOSIS — J383 Other diseases of vocal cords: Secondary | ICD-10-CM | POA: Diagnosis not present

## 2020-10-27 DIAGNOSIS — K219 Gastro-esophageal reflux disease without esophagitis: Secondary | ICD-10-CM | POA: Diagnosis not present

## 2020-11-05 DIAGNOSIS — H25011 Cortical age-related cataract, right eye: Secondary | ICD-10-CM | POA: Diagnosis not present

## 2020-11-05 DIAGNOSIS — H43813 Vitreous degeneration, bilateral: Secondary | ICD-10-CM | POA: Diagnosis not present

## 2020-11-05 DIAGNOSIS — H25013 Cortical age-related cataract, bilateral: Secondary | ICD-10-CM | POA: Diagnosis not present

## 2020-11-17 DIAGNOSIS — Z20822 Contact with and (suspected) exposure to covid-19: Secondary | ICD-10-CM | POA: Diagnosis not present

## 2020-11-30 DIAGNOSIS — G4733 Obstructive sleep apnea (adult) (pediatric): Secondary | ICD-10-CM | POA: Diagnosis not present

## 2020-12-03 DIAGNOSIS — E1169 Type 2 diabetes mellitus with other specified complication: Secondary | ICD-10-CM | POA: Diagnosis not present

## 2020-12-03 DIAGNOSIS — R197 Diarrhea, unspecified: Secondary | ICD-10-CM | POA: Diagnosis not present

## 2020-12-10 DIAGNOSIS — R748 Abnormal levels of other serum enzymes: Secondary | ICD-10-CM | POA: Diagnosis not present

## 2020-12-21 DIAGNOSIS — H2511 Age-related nuclear cataract, right eye: Secondary | ICD-10-CM | POA: Diagnosis not present

## 2020-12-21 DIAGNOSIS — G4733 Obstructive sleep apnea (adult) (pediatric): Secondary | ICD-10-CM | POA: Diagnosis not present

## 2020-12-21 DIAGNOSIS — E1136 Type 2 diabetes mellitus with diabetic cataract: Secondary | ICD-10-CM | POA: Diagnosis not present

## 2020-12-21 DIAGNOSIS — H25011 Cortical age-related cataract, right eye: Secondary | ICD-10-CM | POA: Diagnosis not present

## 2020-12-21 DIAGNOSIS — E785 Hyperlipidemia, unspecified: Secondary | ICD-10-CM | POA: Diagnosis not present

## 2020-12-21 DIAGNOSIS — G4730 Sleep apnea, unspecified: Secondary | ICD-10-CM | POA: Diagnosis not present

## 2020-12-22 DIAGNOSIS — H25012 Cortical age-related cataract, left eye: Secondary | ICD-10-CM | POA: Diagnosis not present

## 2020-12-28 DIAGNOSIS — H25011 Cortical age-related cataract, right eye: Secondary | ICD-10-CM | POA: Diagnosis not present

## 2020-12-29 DIAGNOSIS — R748 Abnormal levels of other serum enzymes: Secondary | ICD-10-CM | POA: Diagnosis not present

## 2021-01-04 DIAGNOSIS — E0590 Thyrotoxicosis, unspecified without thyrotoxic crisis or storm: Secondary | ICD-10-CM | POA: Diagnosis not present

## 2021-01-04 DIAGNOSIS — E1136 Type 2 diabetes mellitus with diabetic cataract: Secondary | ICD-10-CM | POA: Diagnosis not present

## 2021-01-04 DIAGNOSIS — Z888 Allergy status to other drugs, medicaments and biological substances status: Secondary | ICD-10-CM | POA: Diagnosis not present

## 2021-01-04 DIAGNOSIS — H25012 Cortical age-related cataract, left eye: Secondary | ICD-10-CM | POA: Diagnosis not present

## 2021-01-04 DIAGNOSIS — E039 Hypothyroidism, unspecified: Secondary | ICD-10-CM | POA: Diagnosis not present

## 2021-01-04 DIAGNOSIS — G4733 Obstructive sleep apnea (adult) (pediatric): Secondary | ICD-10-CM | POA: Diagnosis not present

## 2021-01-04 DIAGNOSIS — H25812 Combined forms of age-related cataract, left eye: Secondary | ICD-10-CM | POA: Diagnosis not present

## 2021-01-04 DIAGNOSIS — H2512 Age-related nuclear cataract, left eye: Secondary | ICD-10-CM | POA: Diagnosis not present

## 2021-01-04 DIAGNOSIS — M797 Fibromyalgia: Secondary | ICD-10-CM | POA: Diagnosis not present

## 2021-01-04 DIAGNOSIS — G4730 Sleep apnea, unspecified: Secondary | ICD-10-CM | POA: Diagnosis not present

## 2021-01-04 DIAGNOSIS — E785 Hyperlipidemia, unspecified: Secondary | ICD-10-CM | POA: Diagnosis not present

## 2021-01-12 DIAGNOSIS — R748 Abnormal levels of other serum enzymes: Secondary | ICD-10-CM | POA: Diagnosis not present

## 2021-01-12 DIAGNOSIS — N1831 Chronic kidney disease, stage 3a: Secondary | ICD-10-CM | POA: Diagnosis not present

## 2021-01-12 DIAGNOSIS — G5603 Carpal tunnel syndrome, bilateral upper limbs: Secondary | ICD-10-CM | POA: Diagnosis not present

## 2021-01-12 DIAGNOSIS — E039 Hypothyroidism, unspecified: Secondary | ICD-10-CM | POA: Diagnosis not present

## 2021-01-12 DIAGNOSIS — E1122 Type 2 diabetes mellitus with diabetic chronic kidney disease: Secondary | ICD-10-CM | POA: Diagnosis not present

## 2021-01-12 DIAGNOSIS — Z1389 Encounter for screening for other disorder: Secondary | ICD-10-CM | POA: Diagnosis not present

## 2021-01-12 DIAGNOSIS — I639 Cerebral infarction, unspecified: Secondary | ICD-10-CM | POA: Diagnosis not present

## 2021-01-12 DIAGNOSIS — Z0000 Encounter for general adult medical examination without abnormal findings: Secondary | ICD-10-CM | POA: Diagnosis not present

## 2021-01-12 DIAGNOSIS — G4730 Sleep apnea, unspecified: Secondary | ICD-10-CM | POA: Diagnosis not present

## 2021-01-22 DIAGNOSIS — R748 Abnormal levels of other serum enzymes: Secondary | ICD-10-CM | POA: Diagnosis not present

## 2021-01-22 DIAGNOSIS — R945 Abnormal results of liver function studies: Secondary | ICD-10-CM | POA: Diagnosis not present

## 2021-03-09 DIAGNOSIS — Z20822 Contact with and (suspected) exposure to covid-19: Secondary | ICD-10-CM | POA: Diagnosis not present

## 2021-03-12 DIAGNOSIS — G4733 Obstructive sleep apnea (adult) (pediatric): Secondary | ICD-10-CM | POA: Diagnosis not present

## 2021-03-25 DIAGNOSIS — M79602 Pain in left arm: Secondary | ICD-10-CM | POA: Diagnosis not present

## 2021-03-25 DIAGNOSIS — W19XXXA Unspecified fall, initial encounter: Secondary | ICD-10-CM | POA: Diagnosis not present

## 2021-03-31 DIAGNOSIS — D509 Iron deficiency anemia, unspecified: Secondary | ICD-10-CM | POA: Diagnosis not present

## 2021-03-31 DIAGNOSIS — N1831 Chronic kidney disease, stage 3a: Secondary | ICD-10-CM | POA: Diagnosis not present

## 2021-03-31 DIAGNOSIS — E7800 Pure hypercholesterolemia, unspecified: Secondary | ICD-10-CM | POA: Diagnosis not present

## 2021-03-31 DIAGNOSIS — E1169 Type 2 diabetes mellitus with other specified complication: Secondary | ICD-10-CM | POA: Diagnosis not present

## 2021-03-31 DIAGNOSIS — I10 Essential (primary) hypertension: Secondary | ICD-10-CM | POA: Diagnosis not present

## 2021-03-31 DIAGNOSIS — E785 Hyperlipidemia, unspecified: Secondary | ICD-10-CM | POA: Diagnosis not present

## 2021-03-31 DIAGNOSIS — E1122 Type 2 diabetes mellitus with diabetic chronic kidney disease: Secondary | ICD-10-CM | POA: Diagnosis not present

## 2021-03-31 DIAGNOSIS — E139 Other specified diabetes mellitus without complications: Secondary | ICD-10-CM | POA: Diagnosis not present

## 2021-03-31 DIAGNOSIS — E039 Hypothyroidism, unspecified: Secondary | ICD-10-CM | POA: Diagnosis not present

## 2021-03-31 DIAGNOSIS — G4700 Insomnia, unspecified: Secondary | ICD-10-CM | POA: Diagnosis not present

## 2021-03-31 DIAGNOSIS — E1165 Type 2 diabetes mellitus with hyperglycemia: Secondary | ICD-10-CM | POA: Diagnosis not present

## 2021-05-15 ENCOUNTER — Encounter (HOSPITAL_BASED_OUTPATIENT_CLINIC_OR_DEPARTMENT_OTHER): Payer: Self-pay

## 2021-05-15 ENCOUNTER — Emergency Department (HOSPITAL_BASED_OUTPATIENT_CLINIC_OR_DEPARTMENT_OTHER): Payer: Medicare Other

## 2021-05-15 ENCOUNTER — Emergency Department (HOSPITAL_BASED_OUTPATIENT_CLINIC_OR_DEPARTMENT_OTHER)
Admission: EM | Admit: 2021-05-15 | Discharge: 2021-05-15 | Disposition: A | Discharge status: Home / Self Care | Payer: Medicare Other | Attending: Emergency Medicine | Admitting: Emergency Medicine

## 2021-05-15 ENCOUNTER — Other Ambulatory Visit: Payer: Self-pay

## 2021-05-15 DIAGNOSIS — G44319 Acute post-traumatic headache, not intractable: Secondary | ICD-10-CM

## 2021-05-15 DIAGNOSIS — R519 Headache, unspecified: Secondary | ICD-10-CM | POA: Diagnosis not present

## 2021-05-15 DIAGNOSIS — Z794 Long term (current) use of insulin: Secondary | ICD-10-CM | POA: Insufficient documentation

## 2021-05-15 DIAGNOSIS — I10 Essential (primary) hypertension: Secondary | ICD-10-CM | POA: Insufficient documentation

## 2021-05-15 DIAGNOSIS — Z7982 Long term (current) use of aspirin: Secondary | ICD-10-CM | POA: Diagnosis not present

## 2021-05-15 DIAGNOSIS — Z7984 Long term (current) use of oral hypoglycemic drugs: Secondary | ICD-10-CM | POA: Insufficient documentation

## 2021-05-15 DIAGNOSIS — G8929 Other chronic pain: Secondary | ICD-10-CM | POA: Diagnosis not present

## 2021-05-15 DIAGNOSIS — Z79899 Other long term (current) drug therapy: Secondary | ICD-10-CM | POA: Insufficient documentation

## 2021-05-15 DIAGNOSIS — M25552 Pain in left hip: Secondary | ICD-10-CM | POA: Diagnosis not present

## 2021-05-15 DIAGNOSIS — E1165 Type 2 diabetes mellitus with hyperglycemia: Secondary | ICD-10-CM | POA: Diagnosis not present

## 2021-05-15 DIAGNOSIS — R079 Chest pain, unspecified: Secondary | ICD-10-CM | POA: Insufficient documentation

## 2021-05-15 MED ORDER — OXYCODONE HCL 5 MG PO TABS
5.0000 mg | ORAL_TABLET | Freq: Once | ORAL | Status: AC
  Administered 2021-05-15: 5 mg via ORAL
  Filled 2021-05-15: qty 1

## 2021-05-15 MED ORDER — ACETAMINOPHEN 500 MG PO TABS
1000.0000 mg | ORAL_TABLET | Freq: Once | ORAL | Status: AC
  Administered 2021-05-15: 1000 mg via ORAL
  Filled 2021-05-15: qty 2
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
## 2021-05-15 NOTE — Discharge Instructions (Signed)
You will hurt worse tomorrow.  This is normal.  Please return for difficulty breathing excruciating abdominal pain confusion vomiting or worsening headache.  Take 4 over the counter ibuprofen tablets 3 times a day or 2 over-the-counter naproxen tablets twice a day for pain. Also take tylenol 1000mg (2 extra strength) four times a day.

## 2021-05-15 NOTE — ED Notes (Signed)
Patient transported to radiology
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
## 2021-05-18 DIAGNOSIS — Z1231 Encounter for screening mammogram for malignant neoplasm of breast: Secondary | ICD-10-CM | POA: Diagnosis not present

## 2021-05-27 DIAGNOSIS — E1169 Type 2 diabetes mellitus with other specified complication: Secondary | ICD-10-CM | POA: Diagnosis not present

## 2021-05-27 DIAGNOSIS — D509 Iron deficiency anemia, unspecified: Secondary | ICD-10-CM | POA: Diagnosis not present

## 2021-05-27 DIAGNOSIS — E139 Other specified diabetes mellitus without complications: Secondary | ICD-10-CM | POA: Diagnosis not present

## 2021-05-27 DIAGNOSIS — I10 Essential (primary) hypertension: Secondary | ICD-10-CM | POA: Diagnosis not present

## 2021-05-27 DIAGNOSIS — E039 Hypothyroidism, unspecified: Secondary | ICD-10-CM | POA: Diagnosis not present

## 2021-05-27 DIAGNOSIS — E1122 Type 2 diabetes mellitus with diabetic chronic kidney disease: Secondary | ICD-10-CM | POA: Diagnosis not present

## 2021-05-27 DIAGNOSIS — E7800 Pure hypercholesterolemia, unspecified: Secondary | ICD-10-CM | POA: Diagnosis not present

## 2021-05-27 DIAGNOSIS — E1165 Type 2 diabetes mellitus with hyperglycemia: Secondary | ICD-10-CM | POA: Diagnosis not present

## 2021-05-27 DIAGNOSIS — N1831 Chronic kidney disease, stage 3a: Secondary | ICD-10-CM | POA: Diagnosis not present

## 2021-05-27 DIAGNOSIS — G4700 Insomnia, unspecified: Secondary | ICD-10-CM | POA: Diagnosis not present

## 2021-06-16 DIAGNOSIS — E785 Hyperlipidemia, unspecified: Secondary | ICD-10-CM | POA: Diagnosis not present

## 2021-06-16 DIAGNOSIS — I10 Essential (primary) hypertension: Secondary | ICD-10-CM | POA: Diagnosis not present

## 2021-06-16 DIAGNOSIS — G4700 Insomnia, unspecified: Secondary | ICD-10-CM | POA: Diagnosis not present

## 2021-06-16 DIAGNOSIS — E1122 Type 2 diabetes mellitus with diabetic chronic kidney disease: Secondary | ICD-10-CM | POA: Diagnosis not present

## 2021-06-16 DIAGNOSIS — E1165 Type 2 diabetes mellitus with hyperglycemia: Secondary | ICD-10-CM | POA: Diagnosis not present

## 2021-06-16 DIAGNOSIS — E7800 Pure hypercholesterolemia, unspecified: Secondary | ICD-10-CM | POA: Diagnosis not present

## 2021-06-16 DIAGNOSIS — N1831 Chronic kidney disease, stage 3a: Secondary | ICD-10-CM | POA: Diagnosis not present

## 2021-06-16 DIAGNOSIS — E039 Hypothyroidism, unspecified: Secondary | ICD-10-CM | POA: Diagnosis not present

## 2021-06-16 DIAGNOSIS — E1169 Type 2 diabetes mellitus with other specified complication: Secondary | ICD-10-CM | POA: Diagnosis not present

## 2021-07-08 DIAGNOSIS — E0500 Thyrotoxicosis with diffuse goiter without thyrotoxic crisis or storm: Secondary | ICD-10-CM | POA: Diagnosis not present

## 2021-07-12 DIAGNOSIS — E0500 Thyrotoxicosis with diffuse goiter without thyrotoxic crisis or storm: Secondary | ICD-10-CM | POA: Diagnosis not present

## 2021-07-14 DIAGNOSIS — Z7984 Long term (current) use of oral hypoglycemic drugs: Secondary | ICD-10-CM | POA: Diagnosis not present

## 2021-07-14 DIAGNOSIS — E7800 Pure hypercholesterolemia, unspecified: Secondary | ICD-10-CM | POA: Diagnosis not present

## 2021-07-14 DIAGNOSIS — I639 Cerebral infarction, unspecified: Secondary | ICD-10-CM | POA: Diagnosis not present

## 2021-07-14 DIAGNOSIS — E1122 Type 2 diabetes mellitus with diabetic chronic kidney disease: Secondary | ICD-10-CM | POA: Diagnosis not present

## 2021-07-14 DIAGNOSIS — N1831 Chronic kidney disease, stage 3a: Secondary | ICD-10-CM | POA: Diagnosis not present

## 2021-07-14 DIAGNOSIS — R110 Nausea: Secondary | ICD-10-CM | POA: Diagnosis not present

## 2021-07-14 DIAGNOSIS — E0500 Thyrotoxicosis with diffuse goiter without thyrotoxic crisis or storm: Secondary | ICD-10-CM | POA: Diagnosis not present

## 2021-07-14 DIAGNOSIS — G4730 Sleep apnea, unspecified: Secondary | ICD-10-CM | POA: Diagnosis not present

## 2021-07-14 DIAGNOSIS — Z794 Long term (current) use of insulin: Secondary | ICD-10-CM | POA: Diagnosis not present

## 2021-07-14 DIAGNOSIS — Z7985 Long-term (current) use of injectable non-insulin antidiabetic drugs: Secondary | ICD-10-CM | POA: Diagnosis not present

## 2021-07-15 DIAGNOSIS — G4733 Obstructive sleep apnea (adult) (pediatric): Secondary | ICD-10-CM | POA: Diagnosis not present

## 2021-08-20 DIAGNOSIS — G4700 Insomnia, unspecified: Secondary | ICD-10-CM | POA: Diagnosis not present

## 2021-08-20 DIAGNOSIS — E1165 Type 2 diabetes mellitus with hyperglycemia: Secondary | ICD-10-CM | POA: Diagnosis not present

## 2021-08-20 DIAGNOSIS — E1122 Type 2 diabetes mellitus with diabetic chronic kidney disease: Secondary | ICD-10-CM | POA: Diagnosis not present

## 2021-08-20 DIAGNOSIS — E785 Hyperlipidemia, unspecified: Secondary | ICD-10-CM | POA: Diagnosis not present

## 2021-08-20 DIAGNOSIS — E7800 Pure hypercholesterolemia, unspecified: Secondary | ICD-10-CM | POA: Diagnosis not present

## 2021-08-20 DIAGNOSIS — E1169 Type 2 diabetes mellitus with other specified complication: Secondary | ICD-10-CM | POA: Diagnosis not present

## 2021-08-20 DIAGNOSIS — I10 Essential (primary) hypertension: Secondary | ICD-10-CM | POA: Diagnosis not present

## 2021-08-20 DIAGNOSIS — N1831 Chronic kidney disease, stage 3a: Secondary | ICD-10-CM | POA: Diagnosis not present

## 2021-09-12 DIAGNOSIS — Z20822 Contact with and (suspected) exposure to covid-19: Secondary | ICD-10-CM | POA: Diagnosis not present

## 2021-09-12 DIAGNOSIS — R059 Cough, unspecified: Secondary | ICD-10-CM | POA: Diagnosis not present

## 2021-09-12 DIAGNOSIS — J029 Acute pharyngitis, unspecified: Secondary | ICD-10-CM | POA: Diagnosis not present

## 2021-09-12 DIAGNOSIS — J069 Acute upper respiratory infection, unspecified: Secondary | ICD-10-CM | POA: Diagnosis not present

## 2021-09-20 DIAGNOSIS — E1122 Type 2 diabetes mellitus with diabetic chronic kidney disease: Secondary | ICD-10-CM | POA: Diagnosis not present

## 2021-09-20 DIAGNOSIS — E1169 Type 2 diabetes mellitus with other specified complication: Secondary | ICD-10-CM | POA: Diagnosis not present

## 2021-09-20 DIAGNOSIS — N1831 Chronic kidney disease, stage 3a: Secondary | ICD-10-CM | POA: Diagnosis not present

## 2021-09-20 DIAGNOSIS — I10 Essential (primary) hypertension: Secondary | ICD-10-CM | POA: Diagnosis not present

## 2021-09-24 DIAGNOSIS — R197 Diarrhea, unspecified: Secondary | ICD-10-CM | POA: Diagnosis not present

## 2021-09-27 DIAGNOSIS — R197 Diarrhea, unspecified: Secondary | ICD-10-CM | POA: Diagnosis not present

## 2021-10-13 DIAGNOSIS — G4733 Obstructive sleep apnea (adult) (pediatric): Secondary | ICD-10-CM | POA: Diagnosis not present

## 2021-11-01 DIAGNOSIS — R197 Diarrhea, unspecified: Secondary | ICD-10-CM | POA: Diagnosis not present

## 2021-11-02 DIAGNOSIS — R197 Diarrhea, unspecified: Secondary | ICD-10-CM | POA: Diagnosis not present

## 2021-11-03 DIAGNOSIS — R197 Diarrhea, unspecified: Secondary | ICD-10-CM | POA: Diagnosis not present

## 2021-11-04 DIAGNOSIS — E039 Hypothyroidism, unspecified: Secondary | ICD-10-CM | POA: Diagnosis not present

## 2021-11-04 DIAGNOSIS — E1169 Type 2 diabetes mellitus with other specified complication: Secondary | ICD-10-CM | POA: Diagnosis not present

## 2021-11-04 DIAGNOSIS — E785 Hyperlipidemia, unspecified: Secondary | ICD-10-CM | POA: Diagnosis not present

## 2021-11-04 DIAGNOSIS — I639 Cerebral infarction, unspecified: Secondary | ICD-10-CM | POA: Diagnosis not present

## 2021-11-04 DIAGNOSIS — I10 Essential (primary) hypertension: Secondary | ICD-10-CM | POA: Diagnosis not present

## 2021-11-04 DIAGNOSIS — N1831 Chronic kidney disease, stage 3a: Secondary | ICD-10-CM | POA: Diagnosis not present

## 2021-12-17 DIAGNOSIS — M545 Low back pain, unspecified: Secondary | ICD-10-CM | POA: Diagnosis not present

## 2022-01-12 DIAGNOSIS — G4733 Obstructive sleep apnea (adult) (pediatric): Secondary | ICD-10-CM | POA: Diagnosis not present

## 2022-01-20 DIAGNOSIS — I10 Essential (primary) hypertension: Secondary | ICD-10-CM | POA: Diagnosis not present

## 2022-01-20 DIAGNOSIS — Z0000 Encounter for general adult medical examination without abnormal findings: Secondary | ICD-10-CM | POA: Diagnosis not present

## 2022-01-20 DIAGNOSIS — G4700 Insomnia, unspecified: Secondary | ICD-10-CM | POA: Diagnosis not present

## 2022-01-20 DIAGNOSIS — N1831 Chronic kidney disease, stage 3a: Secondary | ICD-10-CM | POA: Diagnosis not present

## 2022-01-20 DIAGNOSIS — E7800 Pure hypercholesterolemia, unspecified: Secondary | ICD-10-CM | POA: Diagnosis not present

## 2022-01-20 DIAGNOSIS — E0500 Thyrotoxicosis with diffuse goiter without thyrotoxic crisis or storm: Secondary | ICD-10-CM | POA: Diagnosis not present

## 2022-01-20 DIAGNOSIS — E1122 Type 2 diabetes mellitus with diabetic chronic kidney disease: Secondary | ICD-10-CM | POA: Diagnosis not present

## 2022-01-20 DIAGNOSIS — G4730 Sleep apnea, unspecified: Secondary | ICD-10-CM | POA: Diagnosis not present

## 2022-01-27 DIAGNOSIS — E1169 Type 2 diabetes mellitus with other specified complication: Secondary | ICD-10-CM | POA: Diagnosis not present

## 2022-01-27 DIAGNOSIS — I639 Cerebral infarction, unspecified: Secondary | ICD-10-CM | POA: Diagnosis not present

## 2022-01-27 DIAGNOSIS — E785 Hyperlipidemia, unspecified: Secondary | ICD-10-CM | POA: Diagnosis not present

## 2022-01-27 DIAGNOSIS — N1831 Chronic kidney disease, stage 3a: Secondary | ICD-10-CM | POA: Diagnosis not present

## 2022-01-27 DIAGNOSIS — E039 Hypothyroidism, unspecified: Secondary | ICD-10-CM | POA: Diagnosis not present

## 2022-01-27 DIAGNOSIS — I10 Essential (primary) hypertension: Secondary | ICD-10-CM | POA: Diagnosis not present

## 2022-03-22 ENCOUNTER — Ambulatory Visit: Payer: Self-pay

## 2022-03-23 DIAGNOSIS — E039 Hypothyroidism, unspecified: Secondary | ICD-10-CM | POA: Diagnosis not present

## 2022-03-23 DIAGNOSIS — I10 Essential (primary) hypertension: Secondary | ICD-10-CM | POA: Diagnosis not present

## 2022-03-23 DIAGNOSIS — E1169 Type 2 diabetes mellitus with other specified complication: Secondary | ICD-10-CM | POA: Diagnosis not present

## 2022-03-23 DIAGNOSIS — E1122 Type 2 diabetes mellitus with diabetic chronic kidney disease: Secondary | ICD-10-CM | POA: Diagnosis not present

## 2022-03-23 DIAGNOSIS — E785 Hyperlipidemia, unspecified: Secondary | ICD-10-CM | POA: Diagnosis not present

## 2022-03-23 DIAGNOSIS — N1831 Chronic kidney disease, stage 3a: Secondary | ICD-10-CM | POA: Diagnosis not present

## 2022-04-05 DIAGNOSIS — H35032 Hypertensive retinopathy, left eye: Secondary | ICD-10-CM | POA: Diagnosis not present

## 2022-04-08 DIAGNOSIS — M25512 Pain in left shoulder: Secondary | ICD-10-CM | POA: Diagnosis not present

## 2022-04-08 DIAGNOSIS — B372 Candidiasis of skin and nail: Secondary | ICD-10-CM | POA: Diagnosis not present

## 2022-04-11 DIAGNOSIS — E1165 Type 2 diabetes mellitus with hyperglycemia: Secondary | ICD-10-CM | POA: Diagnosis not present

## 2022-04-11 IMAGING — MR MR HEAD W/O CM
6 series · 48 of 48 positions shown · non-contrast
Comparison: Brain MRI 02/20/2020

CLINICAL DATA: Stroke follow-up

EXAM:
MRI HEAD WITHOUT CONTRAST
TECHNIQUE: Multiplanar, multiecho pulse sequences of the brain and surrounding
structures were obtained without intravenous contrast.

[Series 2: DWI · axial · 5.0mm · 0.94mm/px · z∈[-68,+76]mm · 13 of 60 slices shown]
[im 1/60]
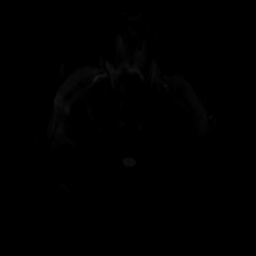
[im 5/60]
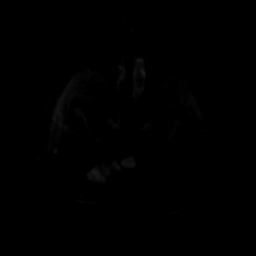
[im 10/60]
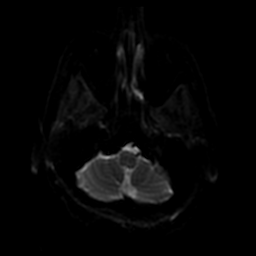
[im 15/60]
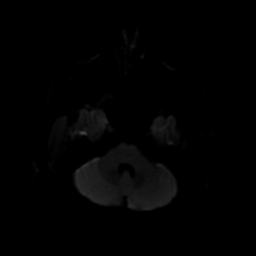
[im 20/60]
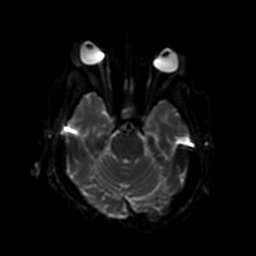
[im 25/60]
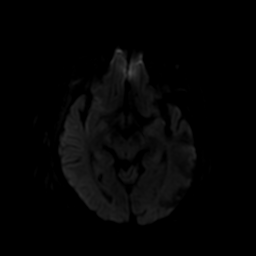
[im 30/60]
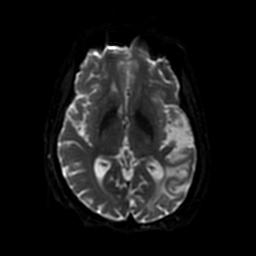
[im 35/60]
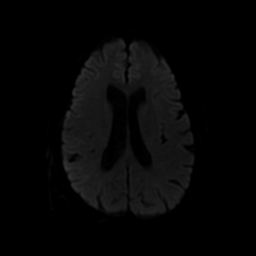
[im 40/60]
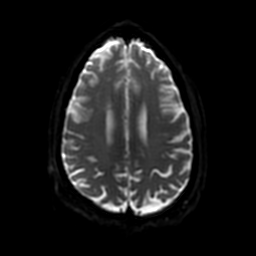
[im 45/60]
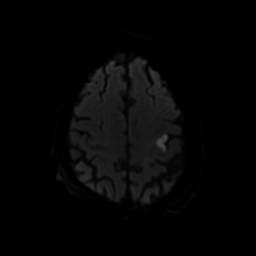
[im 50/60]
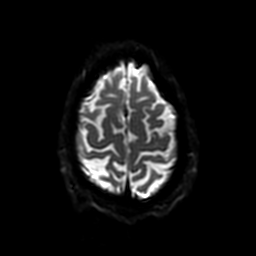
[im 55/60]
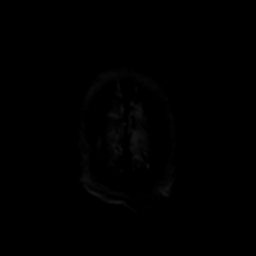
[im 60/60]
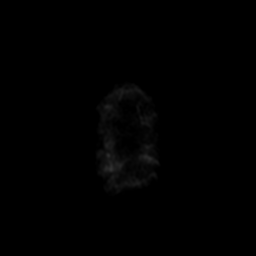

[Series 4: T2-star · axial · 5.0mm · 0.94mm/px · z∈[-67,+78]mm · 7 of 30 slices shown]
[im 1/30]
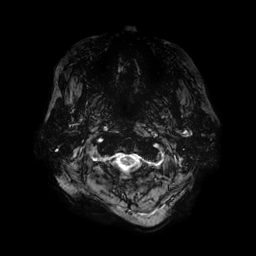
[im 5/30]
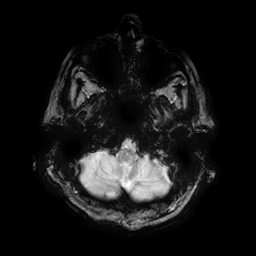
[im 10/30]
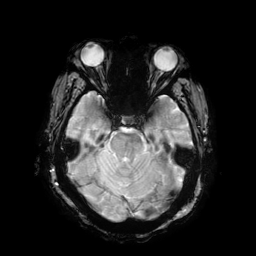
[im 15/30]
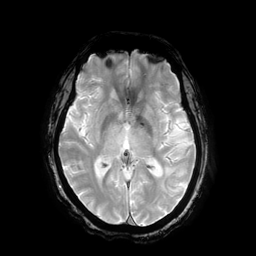
[im 20/30]
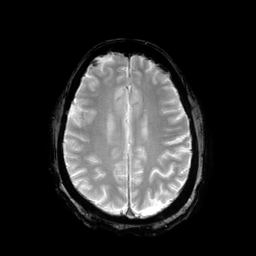
[im 25/30]
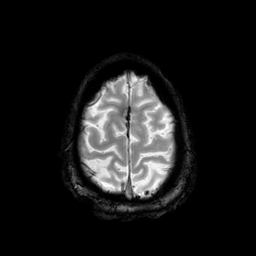
[im 30/30]
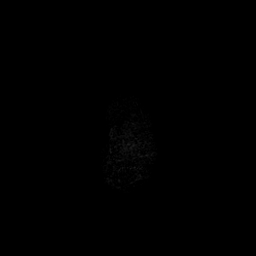

[Series 5: T1 · axial · 5.0mm · 0.94mm/px · z∈[-67,+78]mm · 7 of 30 slices shown]
[im 1/30]
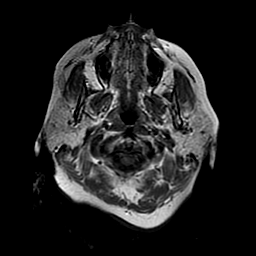
[im 5/30]
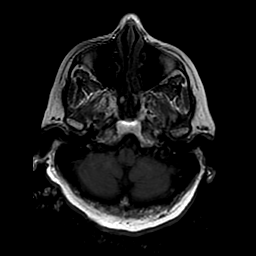
[im 10/30]
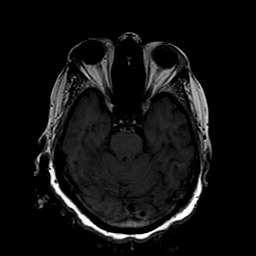
[im 15/30]
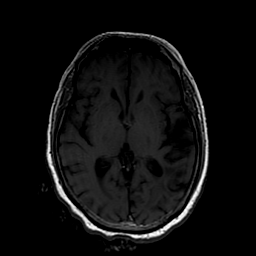
[im 20/30]
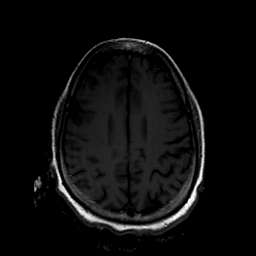
[im 25/30]
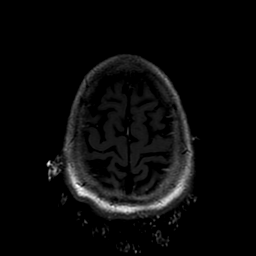
[im 30/30]
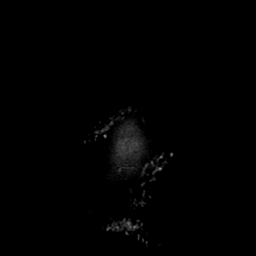

[Series 6: FLAIR · axial · 5.0mm · 0.94mm/px · z∈[-67,+78]mm · 7 of 30 slices shown]
[im 1/30]
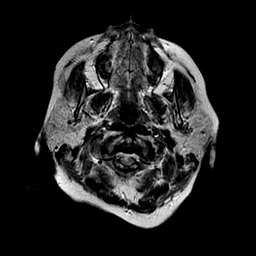
[im 5/30]
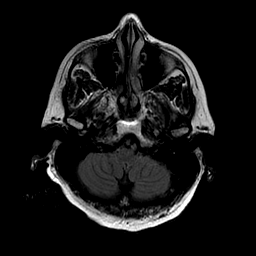
[im 10/30]
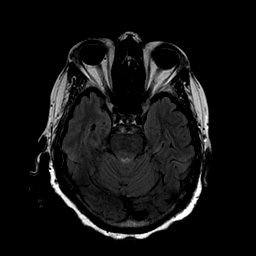
[im 15/30]
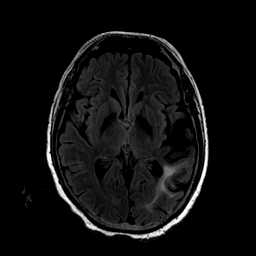
[im 20/30]
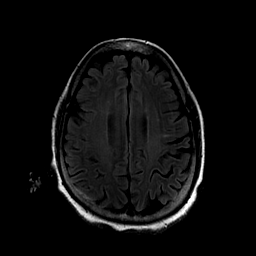
[im 25/30]
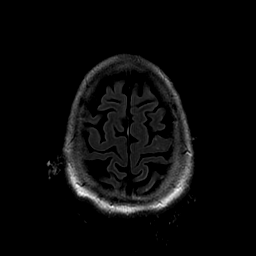
[im 30/30]
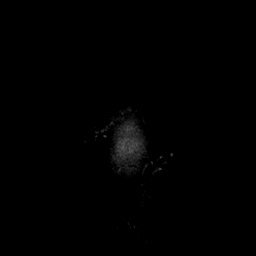

[Series 250: ADC · axial · 5.0mm · 0.94mm/px · z∈[-68,+76]mm · 7 of 30 slices shown]
[im 1/30]
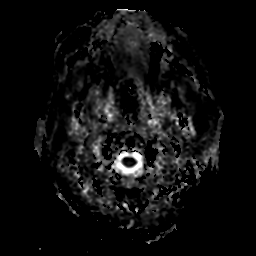
[im 5/30]
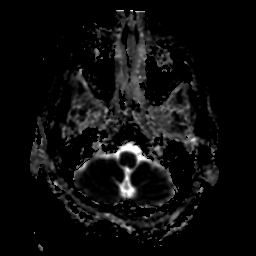
[im 10/30]
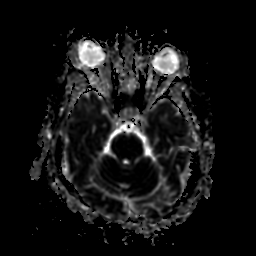
[im 15/30]
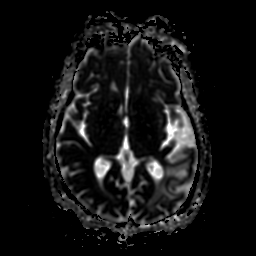
[im 20/30]
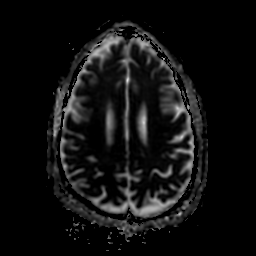
[im 25/30]
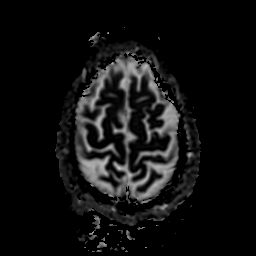
[im 30/30]
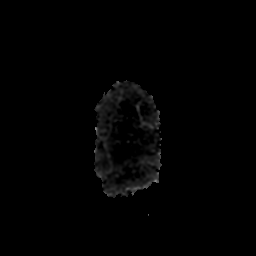

[Series 251: eadc(no-q) · axial · 5.0mm · 0.94mm/px · z∈[-68,+76]mm · 7 of 30 slices shown]
[im 1/30]
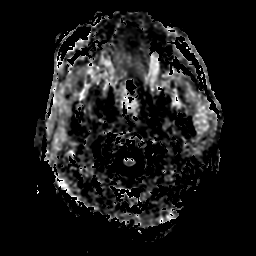
[im 5/30]
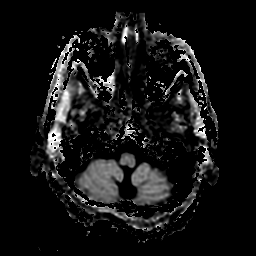
[im 10/30]
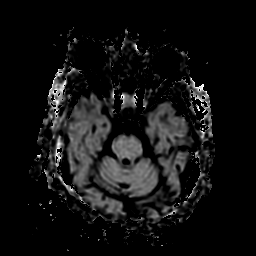
[im 15/30]
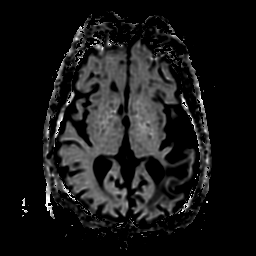
[im 20/30]
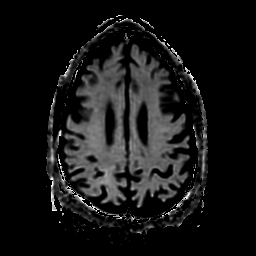
[im 25/30]
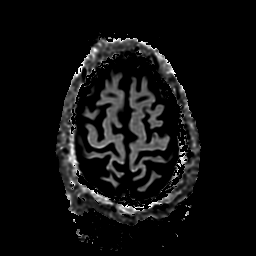
[im 30/30]
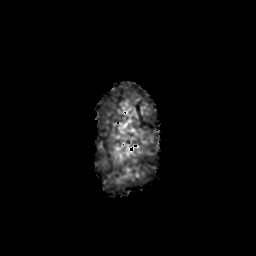

[48 of 48 positions shown; findings below may reference images not displayed]

FINDINGS: Small acute infarct of the left precentral gyrus. There is an old
infarct the left temporal lobe. No acute hemorrhage. There is
multifocal periventricular white matter hyperintensity, most often a
result of chronic microvascular ischemia.
IMPRESSION: Small acute infarct of the left precentral gyrus.

## 2022-04-13 DIAGNOSIS — G4733 Obstructive sleep apnea (adult) (pediatric): Secondary | ICD-10-CM | POA: Diagnosis not present

## 2022-04-20 DIAGNOSIS — I10 Essential (primary) hypertension: Secondary | ICD-10-CM | POA: Diagnosis not present

## 2022-04-20 DIAGNOSIS — I639 Cerebral infarction, unspecified: Secondary | ICD-10-CM | POA: Diagnosis not present

## 2022-04-20 DIAGNOSIS — E785 Hyperlipidemia, unspecified: Secondary | ICD-10-CM | POA: Diagnosis not present

## 2022-04-20 DIAGNOSIS — N1831 Chronic kidney disease, stage 3a: Secondary | ICD-10-CM | POA: Diagnosis not present

## 2022-04-20 DIAGNOSIS — E1169 Type 2 diabetes mellitus with other specified complication: Secondary | ICD-10-CM | POA: Diagnosis not present

## 2022-04-20 DIAGNOSIS — E039 Hypothyroidism, unspecified: Secondary | ICD-10-CM | POA: Diagnosis not present

## 2022-04-28 DIAGNOSIS — K8681 Exocrine pancreatic insufficiency: Secondary | ICD-10-CM | POA: Diagnosis not present

## 2022-04-28 DIAGNOSIS — R1013 Epigastric pain: Secondary | ICD-10-CM | POA: Diagnosis not present

## 2022-05-11 DIAGNOSIS — E1165 Type 2 diabetes mellitus with hyperglycemia: Secondary | ICD-10-CM | POA: Diagnosis not present

## 2022-05-24 DIAGNOSIS — Z1231 Encounter for screening mammogram for malignant neoplasm of breast: Secondary | ICD-10-CM | POA: Diagnosis not present

## 2022-06-11 DIAGNOSIS — E1165 Type 2 diabetes mellitus with hyperglycemia: Secondary | ICD-10-CM | POA: Diagnosis not present

## 2022-06-30 DIAGNOSIS — N1831 Chronic kidney disease, stage 3a: Secondary | ICD-10-CM | POA: Diagnosis not present

## 2022-06-30 DIAGNOSIS — E1169 Type 2 diabetes mellitus with other specified complication: Secondary | ICD-10-CM | POA: Diagnosis not present

## 2022-06-30 DIAGNOSIS — E785 Hyperlipidemia, unspecified: Secondary | ICD-10-CM | POA: Diagnosis not present

## 2022-06-30 DIAGNOSIS — I639 Cerebral infarction, unspecified: Secondary | ICD-10-CM | POA: Diagnosis not present

## 2022-06-30 DIAGNOSIS — I10 Essential (primary) hypertension: Secondary | ICD-10-CM | POA: Diagnosis not present

## 2022-06-30 DIAGNOSIS — E039 Hypothyroidism, unspecified: Secondary | ICD-10-CM | POA: Diagnosis not present

## 2022-07-11 DIAGNOSIS — E1165 Type 2 diabetes mellitus with hyperglycemia: Secondary | ICD-10-CM | POA: Diagnosis not present

## 2022-07-14 DIAGNOSIS — N1831 Chronic kidney disease, stage 3a: Secondary | ICD-10-CM | POA: Diagnosis not present

## 2022-07-14 DIAGNOSIS — I10 Essential (primary) hypertension: Secondary | ICD-10-CM | POA: Diagnosis not present

## 2022-07-14 DIAGNOSIS — R748 Abnormal levels of other serum enzymes: Secondary | ICD-10-CM | POA: Diagnosis not present

## 2022-07-14 DIAGNOSIS — Z794 Long term (current) use of insulin: Secondary | ICD-10-CM | POA: Diagnosis not present

## 2022-07-14 DIAGNOSIS — E0500 Thyrotoxicosis with diffuse goiter without thyrotoxic crisis or storm: Secondary | ICD-10-CM | POA: Diagnosis not present

## 2022-07-14 DIAGNOSIS — E1122 Type 2 diabetes mellitus with diabetic chronic kidney disease: Secondary | ICD-10-CM | POA: Diagnosis not present

## 2022-07-14 DIAGNOSIS — Z8673 Personal history of transient ischemic attack (TIA), and cerebral infarction without residual deficits: Secondary | ICD-10-CM | POA: Diagnosis not present

## 2022-07-14 DIAGNOSIS — G4700 Insomnia, unspecified: Secondary | ICD-10-CM | POA: Diagnosis not present

## 2022-07-14 DIAGNOSIS — G4730 Sleep apnea, unspecified: Secondary | ICD-10-CM | POA: Diagnosis not present

## 2022-07-14 DIAGNOSIS — E7800 Pure hypercholesterolemia, unspecified: Secondary | ICD-10-CM | POA: Diagnosis not present

## 2022-08-02 DIAGNOSIS — J069 Acute upper respiratory infection, unspecified: Secondary | ICD-10-CM | POA: Diagnosis not present

## 2022-08-18 DIAGNOSIS — R748 Abnormal levels of other serum enzymes: Secondary | ICD-10-CM | POA: Diagnosis not present

## 2022-08-26 DIAGNOSIS — N762 Acute vulvitis: Secondary | ICD-10-CM | POA: Diagnosis not present

## 2022-08-26 DIAGNOSIS — B3731 Acute candidiasis of vulva and vagina: Secondary | ICD-10-CM | POA: Diagnosis not present

## 2022-08-26 DIAGNOSIS — Z87898 Personal history of other specified conditions: Secondary | ICD-10-CM | POA: Diagnosis not present

## 2022-08-26 DIAGNOSIS — Z9189 Other specified personal risk factors, not elsewhere classified: Secondary | ICD-10-CM | POA: Diagnosis not present

## 2022-09-06 DIAGNOSIS — E0590 Thyrotoxicosis, unspecified without thyrotoxic crisis or storm: Secondary | ICD-10-CM | POA: Diagnosis not present

## 2022-09-06 DIAGNOSIS — N1831 Chronic kidney disease, stage 3a: Secondary | ICD-10-CM | POA: Diagnosis not present

## 2022-09-06 DIAGNOSIS — I700 Atherosclerosis of aorta: Secondary | ICD-10-CM | POA: Diagnosis not present

## 2022-09-06 DIAGNOSIS — Z6841 Body Mass Index (BMI) 40.0 and over, adult: Secondary | ICD-10-CM | POA: Diagnosis not present

## 2022-09-06 DIAGNOSIS — E1169 Type 2 diabetes mellitus with other specified complication: Secondary | ICD-10-CM | POA: Diagnosis not present

## 2022-09-06 DIAGNOSIS — E785 Hyperlipidemia, unspecified: Secondary | ICD-10-CM | POA: Diagnosis not present

## 2022-09-06 DIAGNOSIS — E559 Vitamin D deficiency, unspecified: Secondary | ICD-10-CM | POA: Diagnosis not present

## 2022-09-06 DIAGNOSIS — B379 Candidiasis, unspecified: Secondary | ICD-10-CM | POA: Diagnosis not present

## 2022-09-06 DIAGNOSIS — Z794 Long term (current) use of insulin: Secondary | ICD-10-CM | POA: Diagnosis not present

## 2022-09-06 DIAGNOSIS — D509 Iron deficiency anemia, unspecified: Secondary | ICD-10-CM | POA: Diagnosis not present

## 2022-09-06 DIAGNOSIS — E1122 Type 2 diabetes mellitus with diabetic chronic kidney disease: Secondary | ICD-10-CM | POA: Diagnosis not present

## 2022-10-18 DIAGNOSIS — N8111 Cystocele, midline: Secondary | ICD-10-CM | POA: Diagnosis not present

## 2022-10-18 DIAGNOSIS — N952 Postmenopausal atrophic vaginitis: Secondary | ICD-10-CM | POA: Diagnosis not present

## 2022-11-01 DIAGNOSIS — N952 Postmenopausal atrophic vaginitis: Secondary | ICD-10-CM | POA: Diagnosis not present

## 2022-11-01 DIAGNOSIS — N814 Uterovaginal prolapse, unspecified: Secondary | ICD-10-CM | POA: Diagnosis not present

## 2022-11-10 DIAGNOSIS — E1122 Type 2 diabetes mellitus with diabetic chronic kidney disease: Secondary | ICD-10-CM | POA: Diagnosis not present

## 2022-11-10 DIAGNOSIS — E0500 Thyrotoxicosis with diffuse goiter without thyrotoxic crisis or storm: Secondary | ICD-10-CM | POA: Diagnosis not present

## 2022-11-10 DIAGNOSIS — K8681 Exocrine pancreatic insufficiency: Secondary | ICD-10-CM | POA: Diagnosis not present

## 2022-11-10 DIAGNOSIS — G4730 Sleep apnea, unspecified: Secondary | ICD-10-CM | POA: Diagnosis not present

## 2022-11-10 DIAGNOSIS — Z794 Long term (current) use of insulin: Secondary | ICD-10-CM | POA: Diagnosis not present

## 2022-11-10 DIAGNOSIS — E7800 Pure hypercholesterolemia, unspecified: Secondary | ICD-10-CM | POA: Diagnosis not present

## 2022-11-10 DIAGNOSIS — Z6841 Body Mass Index (BMI) 40.0 and over, adult: Secondary | ICD-10-CM | POA: Diagnosis not present

## 2022-11-10 DIAGNOSIS — E039 Hypothyroidism, unspecified: Secondary | ICD-10-CM | POA: Diagnosis not present

## 2022-11-10 DIAGNOSIS — N1831 Chronic kidney disease, stage 3a: Secondary | ICD-10-CM | POA: Diagnosis not present

## 2022-11-10 DIAGNOSIS — I10 Essential (primary) hypertension: Secondary | ICD-10-CM | POA: Diagnosis not present

## 2022-11-14 DIAGNOSIS — R7989 Other specified abnormal findings of blood chemistry: Secondary | ICD-10-CM | POA: Diagnosis not present

## 2022-11-14 DIAGNOSIS — R945 Abnormal results of liver function studies: Secondary | ICD-10-CM | POA: Diagnosis not present

## 2022-11-17 DIAGNOSIS — Z4689 Encounter for fitting and adjustment of other specified devices: Secondary | ICD-10-CM | POA: Diagnosis not present

## 2022-11-17 DIAGNOSIS — N8110 Cystocele, unspecified: Secondary | ICD-10-CM | POA: Diagnosis not present

## 2022-11-17 DIAGNOSIS — N179 Acute kidney failure, unspecified: Secondary | ICD-10-CM | POA: Diagnosis not present

## 2022-11-25 DIAGNOSIS — N179 Acute kidney failure, unspecified: Secondary | ICD-10-CM | POA: Diagnosis not present

## 2022-11-30 DIAGNOSIS — G4733 Obstructive sleep apnea (adult) (pediatric): Secondary | ICD-10-CM | POA: Diagnosis not present

## 2022-12-06 DIAGNOSIS — Z4689 Encounter for fitting and adjustment of other specified devices: Secondary | ICD-10-CM | POA: Diagnosis not present

## 2022-12-06 DIAGNOSIS — N8110 Cystocele, unspecified: Secondary | ICD-10-CM | POA: Diagnosis not present

## 2022-12-09 DIAGNOSIS — R809 Proteinuria, unspecified: Secondary | ICD-10-CM | POA: Diagnosis not present

## 2022-12-09 DIAGNOSIS — E1122 Type 2 diabetes mellitus with diabetic chronic kidney disease: Secondary | ICD-10-CM | POA: Diagnosis not present

## 2022-12-09 DIAGNOSIS — I129 Hypertensive chronic kidney disease with stage 1 through stage 4 chronic kidney disease, or unspecified chronic kidney disease: Secondary | ICD-10-CM | POA: Diagnosis not present

## 2022-12-09 DIAGNOSIS — N1832 Chronic kidney disease, stage 3b: Secondary | ICD-10-CM | POA: Diagnosis not present

## 2022-12-09 DIAGNOSIS — N179 Acute kidney failure, unspecified: Secondary | ICD-10-CM | POA: Diagnosis not present

## 2022-12-14 DIAGNOSIS — N1832 Chronic kidney disease, stage 3b: Secondary | ICD-10-CM | POA: Diagnosis not present

## 2022-12-23 DIAGNOSIS — Z86010 Personal history of colonic polyps: Secondary | ICD-10-CM | POA: Diagnosis not present

## 2022-12-23 DIAGNOSIS — Z7901 Long term (current) use of anticoagulants: Secondary | ICD-10-CM | POA: Diagnosis not present

## 2022-12-23 DIAGNOSIS — R197 Diarrhea, unspecified: Secondary | ICD-10-CM | POA: Diagnosis not present

## 2022-12-23 DIAGNOSIS — R140 Abdominal distension (gaseous): Secondary | ICD-10-CM | POA: Diagnosis not present

## 2022-12-27 ENCOUNTER — Other Ambulatory Visit (HOSPITAL_COMMUNITY): Payer: Self-pay | Admitting: Nephrology

## 2022-12-27 DIAGNOSIS — I129 Hypertensive chronic kidney disease with stage 1 through stage 4 chronic kidney disease, or unspecified chronic kidney disease: Secondary | ICD-10-CM | POA: Diagnosis not present

## 2022-12-27 DIAGNOSIS — R809 Proteinuria, unspecified: Secondary | ICD-10-CM | POA: Diagnosis not present

## 2022-12-27 DIAGNOSIS — N179 Acute kidney failure, unspecified: Secondary | ICD-10-CM | POA: Diagnosis not present

## 2022-12-27 DIAGNOSIS — R768 Other specified abnormal immunological findings in serum: Secondary | ICD-10-CM | POA: Diagnosis not present

## 2022-12-27 DIAGNOSIS — E1122 Type 2 diabetes mellitus with diabetic chronic kidney disease: Secondary | ICD-10-CM | POA: Diagnosis not present

## 2022-12-27 DIAGNOSIS — N1832 Chronic kidney disease, stage 3b: Secondary | ICD-10-CM | POA: Diagnosis not present

## 2022-12-28 ENCOUNTER — Telehealth: Payer: Self-pay

## 2022-12-28 NOTE — Telephone Encounter (Signed)
Received forms for surgical clearance to stop pt Plavix. I'll place forms on the work in Librarian, academic. Dr. Marjory Lies is the AM work in Dr.

## 2022-12-28 NOTE — Telephone Encounter (Signed)
Patient received forms from [facility] for Surgical Clearance and placed on Dr. Marjory Lies desk.

## 2023-01-05 NOTE — Progress Notes (Signed)
Estanislado Emms, MD  Leodis Rains D I have already asked her to hold plavix for 5 days before the biopsy and 5 days after  Estanislado Emms

## 2023-01-06 ENCOUNTER — Other Ambulatory Visit: Payer: Self-pay | Admitting: Internal Medicine

## 2023-01-06 DIAGNOSIS — N179 Acute kidney failure, unspecified: Secondary | ICD-10-CM

## 2023-01-09 ENCOUNTER — Encounter (HOSPITAL_COMMUNITY): Payer: Self-pay

## 2023-01-09 ENCOUNTER — Ambulatory Visit (HOSPITAL_COMMUNITY)
Admission: RE | Admit: 2023-01-09 | Discharge: 2023-01-09 | Disposition: A | Discharge status: Home / Self Care | Payer: PPO | Source: Ambulatory Visit | Attending: Nephrology | Admitting: Nephrology

## 2023-01-09 ENCOUNTER — Other Ambulatory Visit: Payer: Self-pay

## 2023-01-09 DIAGNOSIS — N179 Acute kidney failure, unspecified: Secondary | ICD-10-CM | POA: Insufficient documentation

## 2023-01-09 DIAGNOSIS — R809 Proteinuria, unspecified: Secondary | ICD-10-CM | POA: Insufficient documentation

## 2023-01-09 DIAGNOSIS — N1832 Chronic kidney disease, stage 3b: Secondary | ICD-10-CM | POA: Insufficient documentation

## 2023-01-09 DIAGNOSIS — Z0389 Encounter for observation for other suspected diseases and conditions ruled out: Secondary | ICD-10-CM | POA: Diagnosis not present

## 2023-01-09 LAB — CBC WITH DIFFERENTIAL/PLATELET
Abs Immature Granulocytes: 0.02 10*3/uL (ref 0.00–0.07)
Basophils Absolute: 0 10*3/uL (ref 0.0–0.1)
Basophils Relative: 1 %
Eosinophils Absolute: 0.1 10*3/uL (ref 0.0–0.5)
Eosinophils Relative: 2 %
HCT: 35.2 % — ABNORMAL LOW (ref 36.0–46.0)
Hemoglobin: 11.2 g/dL — ABNORMAL LOW (ref 12.0–15.0)
Immature Granulocytes: 0 %
Lymphocytes Relative: 34 %
Lymphs Abs: 1.8 10*3/uL (ref 0.7–4.0)
MCH: 29.1 pg (ref 26.0–34.0)
MCHC: 31.8 g/dL (ref 30.0–36.0)
MCV: 91.4 fL (ref 80.0–100.0)
Monocytes Absolute: 0.5 10*3/uL (ref 0.1–1.0)
Monocytes Relative: 10 %
Neutro Abs: 2.8 10*3/uL (ref 1.7–7.7)
Neutrophils Relative %: 53 %
Platelets: 224 10*3/uL (ref 150–400)
RBC: 3.85 MIL/uL — ABNORMAL LOW (ref 3.87–5.11)
RDW: 13.7 % (ref 11.5–15.5)
WBC: 5.3 10*3/uL (ref 4.0–10.5)
nRBC: 0 % (ref 0.0–0.2)

## 2023-01-09 LAB — BASIC METABOLIC PANEL
Anion gap: 9 (ref 5–15)
BUN: 26 mg/dL — ABNORMAL HIGH (ref 8–23)
CO2: 20 mmol/L — ABNORMAL LOW (ref 22–32)
Calcium: 8.4 mg/dL — ABNORMAL LOW (ref 8.9–10.3)
Chloride: 109 mmol/L (ref 98–111)
Creatinine, Ser: 2.1 mg/dL — ABNORMAL HIGH (ref 0.44–1.00)
GFR, Estimated: 25 mL/min — ABNORMAL LOW (ref >60)
Glucose, Bld: 78 mg/dL (ref 70–99)
Potassium: 3.8 mmol/L (ref 3.5–5.1)
Sodium: 138 mmol/L (ref 135–145)

## 2023-01-09 LAB — GLUCOSE, CAPILLARY
Glucose-Capillary: 84 mg/dL (ref 70–99)
Glucose-Capillary: 74 mg/dL (ref 70–99)

## 2023-01-09 LAB — PROTIME-INR
INR: 1.1 (ref 0.8–1.2)
Prothrombin Time: 14.2 seconds (ref 11.4–15.2)

## 2023-01-09 MED ORDER — MIDAZOLAM HCL 2 MG/2ML IJ SOLN
INTRAMUSCULAR | Status: AC | PRN
  Administered 2023-01-09: 1 mg via INTRAVENOUS

## 2023-01-09 MED ORDER — FENTANYL CITRATE (PF) 100 MCG/2ML IJ SOLN
INTRAMUSCULAR | Status: AC
  Filled 2023-01-09: qty 2

## 2023-01-09 MED ORDER — SODIUM CHLORIDE 0.9 % IV SOLN: INTRAVENOUS | Status: DC

## 2023-01-09 MED ORDER — LIDOCAINE HCL (PF) 1 % IJ SOLN
10.0000 mL | Freq: Once | INTRAMUSCULAR | Status: AC
  Administered 2023-01-09: 10 mL via INTRADERMAL

## 2023-01-09 MED ORDER — GELATIN ABSORBABLE 12-7 MM EX MISC
1.0000 | Freq: Once | CUTANEOUS | Status: AC
  Administered 2023-01-09: 1 via TOPICAL

## 2023-01-09 MED ORDER — MIDAZOLAM HCL 2 MG/2ML IJ SOLN
INTRAMUSCULAR | Status: AC
  Filled 2023-01-09: qty 2

## 2023-01-09 MED ORDER — FENTANYL CITRATE (PF) 100 MCG/2ML IJ SOLN
INTRAMUSCULAR | Status: AC | PRN
  Administered 2023-01-09: 50 ug via INTRAVENOUS

## 2023-01-09 NOTE — H&P (Addendum)
Chief Complaint: Patient was seen in consultation today for Random renal biopsy at the request of Vallery Sa C  Referring Physician(s): Estanislado Emms  Supervising Physician: Gilmer Mor  Patient Status: [facility] - Out-pt  History of Present Illness: Catherine Bautista is a 72 y.o. female   Full Code status per pt AKI/CKD Nephrotic range proteinuria Scheduled today for random renal biopsy per Dr Malen Gauze  LD Plavix 01/03/23  Past Medical History:  Diagnosis Date   Anemia    Anemia, vitamin B12 deficiency    Depression with anxiety    Diabetes mellitus    Dyslipidemia    Fibromyalgia    Graves disease    Hypertension    Obesity    Stroke (HCC)    "I've had 3 strokes"    Past Surgical History:  Procedure Laterality Date   ABDOMINAL HYSTERECTOMY  2007   CHOLECYSTECTOMY     laproscopic   EP IMPLANTABLE DEVICE N/A 01/05/2016   Procedure: Loop Recorder Insertion;  Surgeon: Will Jorja Loa, MD;  Location: [facility] INVASIVE CV LAB;  Service: Cardiovascular;  Laterality: N/A;   GASTRIC BYPASS  1981   IR ANGIO INTRA EXTRACRAN SEL COM CAROTID INNOMINATE BILAT MOD SED  03/20/2020   IR ANGIO VERTEBRAL SEL VERTEBRAL BILAT MOD SED  03/20/2020   IR US GUIDE VASC ACCESS RIGHT  03/20/2020   TEE WITHOUT CARDIOVERSION N/A 01/05/2016   Procedure: TRANSESOPHAGEAL ECHOCARDIOGRAM (TEE);  Surgeon: Quintella Reichert, MD;  Location: [facility] ENDOSCOPY;  Service: Cardiovascular;  Laterality: N/A;   TEE WITHOUT CARDIOVERSION N/A 04/18/2017   Procedure: TRANSESOPHAGEAL ECHOCARDIOGRAM (TEE);  Surgeon: Lewayne Bunting, MD;  Location: [facility] ENDOSCOPY;  Service: Cardiovascular;  Laterality: N/A;   therapuetic abortion  1986   associated with C-section    Allergies: Canagliflozin, Demerol, Excedrin extra strength [aspirin-acetaminophen-caffeine], Janumet [sitagliptin-metformin hcl], Melatonin, Morphine and codeine, Novocain [procaine hcl], and Procaine  Medications: Prior to Admission medications   Medication Sig Start  Date End Date Taking? Authorizing Provider  amLODipine (NORVASC) 5 MG tablet Take 5 mg by mouth daily.   Yes [provider]  atorvastatin (LIPITOR) 80 MG tablet Take 1 tablet (80 mg total) by mouth at bedtime. 02/22/20  Yes Mikhail, [place], DO  b complex vitamins tablet Take 1 tablet by mouth daily.    Yes [provider]  Cholecalciferol (VITAMIN D3) 50 MCG (2000 UT) TABS Take 2,000 Units by mouth daily.   Yes [provider]  ferrous sulfate 325 (65 FE) MG tablet Take 325 mg by mouth daily with breakfast.   Yes [provider]  Insulin Glargine w/ Trans Port (BASAGLAR TEMPO PEN) 100 UNIT/ML SOPN Inject 30 Units into the skin daily.   Yes [provider]  loratadine (CLARITIN) 10 MG tablet Take 10 mg by mouth daily.   Yes [provider]  Magnesium 300 MG CAPS Take 300 mg by mouth daily.   Yes [provider]  Prenatal Vit-Fe Fumarate-FA (PRENATAL PO) Take 1 tablet by mouth daily.   Yes [provider]  ramipril (ALTACE) 10 MG capsule Take 10 mg by mouth daily.     Yes [provider]  vitamin C (ASCORBIC ACID) 500 MG tablet Take 500 mg by mouth daily.   Yes [provider]  ACCU-CHEK SMARTVIEW test strip USE TO CHECK YOUR BLOOD SUGAR THREE TIMES A DAY DX E13.9 IN VITRO 90 DAYS 06/24/19   [provider]  BD PEN NEEDLE NANO 2ND GEN 32G X 4 MM MISC USE TO  TEST BLOOD SUGAR 3 TIMES DAILY 07/01/19   [provider]  empagliflozin (JARDIANCE) 10 MG TABS tablet Take 10 mg by mouth daily.    [provider]  empagliflozin (JARDIANCE) 25 MG TABS tablet Take 25 mg by mouth daily.    [provider]  glimepiride (AMARYL) 4 MG tablet Take 4 mg by mouth daily with breakfast.    [provider]  hydrochlorothiazide (HYDRODIURIL) 25 MG tablet Take 1 tablet (25 mg total) by mouth daily. 01/11/19   Joseph Art, DO  Insulin Detemir (LEVEMIR FLEXTOUCH) 100 UNIT/ML Pen Inject 54  Units into the skin at bedtime.     [provider]  Investigational - Study Medication Take 2 tablets by mouth 2 (two) times daily. Study name: BMS [id number] 25 mg, 100 mg, or placebo  Additional study details: Through Dr. Pearlean Brownie    [provider]  metFORMIN (GLUCOPHAGE-XR) 750 MG 24 hr tablet Take 750 mg by mouth 2 (two) times a day.    [provider]  neomycin-bacitracin-polymyxin (NEOSPORIN) ointment Apply 1 application topically daily as needed for wound care.    [provider]  OMEGA 3 1200 MG CAPS Take 1,200 mg by mouth at bedtime.     [provider]  STUDY - AXIOMATIC - aspirin 100mg  (PI - Sethi) Take 100 mg by mouth daily.    [provider]     Family History  Problem Relation Age of Onset   Stroke Mother    Heart failure Mother    Lung cancer Father    Kidney cancer Brother    Stroke Cousin     Social History   Socioeconomic History   Marital status: Married    Spouse name: Not on file   Number of children: Not on file   Years of education: Not on file   Highest education level: Not on file  Occupational History   Not on file  Tobacco Use   Smoking status: Never   Smokeless tobacco: Never  Vaping Use   Vaping Use: Never used  Substance and Sexual Activity   Alcohol use: No   Drug use: No   Sexual activity: Not on file  Other Topics Concern   Not on file  Social History Narrative   Lives at home with husband   Right handed   Caffeine: green tea and regular tea, about 24 oz daily   Social Determinants of Health   Financial Resource Strain: Not on file  Food Insecurity: Not on file  Transportation Needs: Not on file  Physical Activity: Not on file  Stress: Not on file  Social Connections: Not on file    Review of Systems: A 12 point ROS discussed and pertinent positives are indicated in the HPI above.  All other systems are negative.  Review of Systems  Constitutional:  Negative for activity change,  appetite change, fatigue and fever.  Respiratory:  Negative for cough and shortness of breath.   Cardiovascular:  Negative for chest pain.  Gastrointestinal:  Negative for abdominal pain.  Neurological:  Negative for weakness.  Psychiatric/Behavioral:  Negative for behavioral problems and confusion.     Vital Signs: There were no vitals taken for this visit.  Advance Care Plan: The advanced care plan/surrogate decision maker was discussed at the time of visit and documented in the medical record.    Physical Exam Vitals reviewed.  HENT:     Mouth/Throat:     Mouth: Mucous membranes are moist.  Cardiovascular:     Rate and Rhythm: Normal rate and regular rhythm.     Heart sounds: Normal heart sounds.  Pulmonary:     Effort: Pulmonary effort is normal.     Breath sounds: Normal breath sounds.  Abdominal:     Palpations: Abdomen is soft.  Musculoskeletal:        General: Normal range of motion.     Right lower leg: No edema.     Left lower leg: No edema.  Skin:    General: Skin is warm.  Neurological:     Mental Status: She is alert and oriented to person, place, and time.  Psychiatric:        Behavior: Behavior normal.     Imaging: No results found.  Labs:  CBC: Recent Labs    01/09/23 0748  WBC 5.3  HGB 11.2*  HCT 35.2*  PLT 224    COAGS: Recent Labs    01/09/23 0748  INR 1.1    BMP: No results for input(s): "NA", "K", "CL", "CO2", "GLUCOSE", "BUN", "CALCIUM", "CREATININE", "GFRNONAA", "GFRAA" in the last 8760 hours.  Invalid input(s): "CMP"  LIVER FUNCTION TESTS: No results for input(s): "BILITOT", "AST", "ALT", "ALKPHOS", "PROT", "ALBUMIN" in the last 8760 hours.  TUMOR MARKERS: No results for input(s): "AFPTM", "CEA", "CA199", "CHROMGRNA" in the last 8760 hours.  Assessment and Plan:  Scheduled today for random renal biopsy Nephrotic range proteinuria Risks and benefits of random renal biopsy was discussed with the patient and/or  patient's family including, but not limited to bleeding, infection, damage to adjacent structures or low yield requiring additional tests.  All of the questions were answered and there is agreement to proceed.  Consent signed and in chart.  Thank you for this interesting consult.  I greatly enjoyed meeting RAYCHEL DOWLER and look forward to participating in their care.  A copy of this report was sent to the requesting provider on this date.  Electronically Signed: Robet Leu, PA-C 01/09/2023, 8:23 AM   I spent a total of  30 Minutes   in face to face in clinical consultation, greater than 50% of which was counseling/coordinating care for random renal biopsy

## 2023-01-09 NOTE — Procedures (Signed)
Interventional Radiology Procedure Note  Procedure: US guided biopsy of left kidney, medical renal Complications: None EBL: None Recommendations: - Bedrest 2 hours.   - Routine wound care - Follow up pathology - Advance diet   Signed,  Nada Godley, DO   

## 2023-01-11 DIAGNOSIS — Z03818 Encounter for observation for suspected exposure to other biological agents ruled out: Secondary | ICD-10-CM | POA: Diagnosis not present

## 2023-01-11 DIAGNOSIS — I10 Essential (primary) hypertension: Secondary | ICD-10-CM | POA: Diagnosis not present

## 2023-01-11 DIAGNOSIS — R590 Localized enlarged lymph nodes: Secondary | ICD-10-CM | POA: Diagnosis not present

## 2023-01-11 DIAGNOSIS — E1165 Type 2 diabetes mellitus with hyperglycemia: Secondary | ICD-10-CM | POA: Diagnosis not present

## 2023-01-11 DIAGNOSIS — H9202 Otalgia, left ear: Secondary | ICD-10-CM | POA: Diagnosis not present

## 2023-01-13 ENCOUNTER — Encounter (HOSPITAL_COMMUNITY): Payer: Self-pay

## 2023-01-16 LAB — SURGICAL PATHOLOGY

## 2023-01-25 DIAGNOSIS — R110 Nausea: Secondary | ICD-10-CM | POA: Diagnosis not present

## 2023-01-25 DIAGNOSIS — R140 Abdominal distension (gaseous): Secondary | ICD-10-CM | POA: Diagnosis not present

## 2023-01-25 DIAGNOSIS — Z9884 Bariatric surgery status: Secondary | ICD-10-CM | POA: Diagnosis not present

## 2023-01-25 DIAGNOSIS — D123 Benign neoplasm of transverse colon: Secondary | ICD-10-CM | POA: Diagnosis not present

## 2023-01-25 DIAGNOSIS — R197 Diarrhea, unspecified: Secondary | ICD-10-CM | POA: Diagnosis not present

## 2023-01-25 DIAGNOSIS — K649 Unspecified hemorrhoids: Secondary | ICD-10-CM | POA: Diagnosis not present

## 2023-01-31 DIAGNOSIS — R809 Proteinuria, unspecified: Secondary | ICD-10-CM | POA: Diagnosis not present

## 2023-02-01 DIAGNOSIS — D123 Benign neoplasm of transverse colon: Secondary | ICD-10-CM | POA: Diagnosis not present

## 2023-02-07 DIAGNOSIS — E559 Vitamin D deficiency, unspecified: Secondary | ICD-10-CM | POA: Diagnosis not present

## 2023-02-07 DIAGNOSIS — R7401 Elevation of levels of liver transaminase levels: Secondary | ICD-10-CM | POA: Diagnosis not present

## 2023-02-07 DIAGNOSIS — N184 Chronic kidney disease, stage 4 (severe): Secondary | ICD-10-CM | POA: Diagnosis not present

## 2023-02-07 DIAGNOSIS — R809 Proteinuria, unspecified: Secondary | ICD-10-CM | POA: Diagnosis not present

## 2023-02-07 DIAGNOSIS — N2581 Secondary hyperparathyroidism of renal origin: Secondary | ICD-10-CM | POA: Diagnosis not present

## 2023-02-07 DIAGNOSIS — I129 Hypertensive chronic kidney disease with stage 1 through stage 4 chronic kidney disease, or unspecified chronic kidney disease: Secondary | ICD-10-CM | POA: Diagnosis not present

## 2023-02-07 DIAGNOSIS — E1122 Type 2 diabetes mellitus with diabetic chronic kidney disease: Secondary | ICD-10-CM | POA: Diagnosis not present

## 2023-02-07 DIAGNOSIS — R768 Other specified abnormal immunological findings in serum: Secondary | ICD-10-CM | POA: Diagnosis not present

## 2023-02-21 DIAGNOSIS — N8110 Cystocele, unspecified: Secondary | ICD-10-CM | POA: Diagnosis not present

## 2023-02-21 DIAGNOSIS — Z4689 Encounter for fitting and adjustment of other specified devices: Secondary | ICD-10-CM | POA: Diagnosis not present

## 2023-02-22 DIAGNOSIS — J029 Acute pharyngitis, unspecified: Secondary | ICD-10-CM | POA: Diagnosis not present

## 2023-02-22 DIAGNOSIS — Z20822 Contact with and (suspected) exposure to covid-19: Secondary | ICD-10-CM | POA: Diagnosis not present

## 2023-02-22 DIAGNOSIS — J069 Acute upper respiratory infection, unspecified: Secondary | ICD-10-CM | POA: Diagnosis not present

## 2023-03-23 DIAGNOSIS — N2581 Secondary hyperparathyroidism of renal origin: Secondary | ICD-10-CM | POA: Diagnosis not present

## 2023-03-23 DIAGNOSIS — E039 Hypothyroidism, unspecified: Secondary | ICD-10-CM | POA: Diagnosis not present

## 2023-03-23 DIAGNOSIS — K8681 Exocrine pancreatic insufficiency: Secondary | ICD-10-CM | POA: Diagnosis not present

## 2023-03-23 DIAGNOSIS — I10 Essential (primary) hypertension: Secondary | ICD-10-CM | POA: Diagnosis not present

## 2023-03-23 DIAGNOSIS — Z0000 Encounter for general adult medical examination without abnormal findings: Secondary | ICD-10-CM | POA: Diagnosis not present

## 2023-03-23 DIAGNOSIS — Z23 Encounter for immunization: Secondary | ICD-10-CM | POA: Diagnosis not present

## 2023-03-23 DIAGNOSIS — G4730 Sleep apnea, unspecified: Secondary | ICD-10-CM | POA: Diagnosis not present

## 2023-03-23 DIAGNOSIS — N184 Chronic kidney disease, stage 4 (severe): Secondary | ICD-10-CM | POA: Diagnosis not present

## 2023-03-23 DIAGNOSIS — E7800 Pure hypercholesterolemia, unspecified: Secondary | ICD-10-CM | POA: Diagnosis not present

## 2023-03-23 DIAGNOSIS — I700 Atherosclerosis of aorta: Secondary | ICD-10-CM | POA: Diagnosis not present

## 2023-03-23 DIAGNOSIS — E1122 Type 2 diabetes mellitus with diabetic chronic kidney disease: Secondary | ICD-10-CM | POA: Diagnosis not present

## 2023-04-08 DIAGNOSIS — L02413 Cutaneous abscess of right upper limb: Secondary | ICD-10-CM | POA: Diagnosis not present

## 2023-04-08 DIAGNOSIS — L0291 Cutaneous abscess, unspecified: Secondary | ICD-10-CM | POA: Diagnosis not present

## 2023-04-10 DIAGNOSIS — L089 Local infection of the skin and subcutaneous tissue, unspecified: Secondary | ICD-10-CM | POA: Diagnosis not present

## 2023-04-10 DIAGNOSIS — L02411 Cutaneous abscess of right axilla: Secondary | ICD-10-CM | POA: Diagnosis not present

## 2023-04-21 DIAGNOSIS — H26493 Other secondary cataract, bilateral: Secondary | ICD-10-CM | POA: Diagnosis not present

## 2023-04-21 DIAGNOSIS — Z961 Presence of intraocular lens: Secondary | ICD-10-CM | POA: Diagnosis not present

## 2023-04-21 DIAGNOSIS — E119 Type 2 diabetes mellitus without complications: Secondary | ICD-10-CM | POA: Diagnosis not present

## 2023-04-21 DIAGNOSIS — H35032 Hypertensive retinopathy, left eye: Secondary | ICD-10-CM | POA: Diagnosis not present

## 2023-05-01 DIAGNOSIS — N184 Chronic kidney disease, stage 4 (severe): Secondary | ICD-10-CM | POA: Diagnosis not present

## 2023-05-09 DIAGNOSIS — R768 Other specified abnormal immunological findings in serum: Secondary | ICD-10-CM | POA: Diagnosis not present

## 2023-05-09 DIAGNOSIS — D631 Anemia in chronic kidney disease: Secondary | ICD-10-CM | POA: Diagnosis not present

## 2023-05-09 DIAGNOSIS — E1122 Type 2 diabetes mellitus with diabetic chronic kidney disease: Secondary | ICD-10-CM | POA: Diagnosis not present

## 2023-05-09 DIAGNOSIS — N189 Chronic kidney disease, unspecified: Secondary | ICD-10-CM | POA: Diagnosis not present

## 2023-05-09 DIAGNOSIS — N2581 Secondary hyperparathyroidism of renal origin: Secondary | ICD-10-CM | POA: Diagnosis not present

## 2023-05-09 DIAGNOSIS — R7401 Elevation of levels of liver transaminase levels: Secondary | ICD-10-CM | POA: Diagnosis not present

## 2023-05-09 DIAGNOSIS — E559 Vitamin D deficiency, unspecified: Secondary | ICD-10-CM | POA: Diagnosis not present

## 2023-05-09 DIAGNOSIS — N184 Chronic kidney disease, stage 4 (severe): Secondary | ICD-10-CM | POA: Diagnosis not present

## 2023-05-09 DIAGNOSIS — R809 Proteinuria, unspecified: Secondary | ICD-10-CM | POA: Diagnosis not present

## 2023-05-09 DIAGNOSIS — I129 Hypertensive chronic kidney disease with stage 1 through stage 4 chronic kidney disease, or unspecified chronic kidney disease: Secondary | ICD-10-CM | POA: Diagnosis not present

## 2023-05-11 DIAGNOSIS — G4733 Obstructive sleep apnea (adult) (pediatric): Secondary | ICD-10-CM | POA: Diagnosis not present

## 2023-05-22 DIAGNOSIS — H5201 Hypermetropia, right eye: Secondary | ICD-10-CM | POA: Diagnosis not present

## 2023-05-22 DIAGNOSIS — Z961 Presence of intraocular lens: Secondary | ICD-10-CM | POA: Diagnosis not present

## 2023-05-22 DIAGNOSIS — H52203 Unspecified astigmatism, bilateral: Secondary | ICD-10-CM | POA: Diagnosis not present

## 2023-05-22 DIAGNOSIS — H524 Presbyopia: Secondary | ICD-10-CM | POA: Diagnosis not present

## 2023-05-22 DIAGNOSIS — E1165 Type 2 diabetes mellitus with hyperglycemia: Secondary | ICD-10-CM | POA: Diagnosis not present

## 2023-05-22 DIAGNOSIS — Z794 Long term (current) use of insulin: Secondary | ICD-10-CM | POA: Diagnosis not present

## 2023-05-22 DIAGNOSIS — H43813 Vitreous degeneration, bilateral: Secondary | ICD-10-CM | POA: Diagnosis not present

## 2023-05-22 DIAGNOSIS — H26493 Other secondary cataract, bilateral: Secondary | ICD-10-CM | POA: Diagnosis not present

## 2023-05-24 DIAGNOSIS — H26491 Other secondary cataract, right eye: Secondary | ICD-10-CM | POA: Diagnosis not present

## 2023-05-26 DIAGNOSIS — H26492 Other secondary cataract, left eye: Secondary | ICD-10-CM | POA: Diagnosis not present

## 2023-05-30 DIAGNOSIS — Z1231 Encounter for screening mammogram for malignant neoplasm of breast: Secondary | ICD-10-CM | POA: Diagnosis not present

## 2023-06-26 DIAGNOSIS — M5412 Radiculopathy, cervical region: Secondary | ICD-10-CM | POA: Diagnosis not present

## 2023-07-06 DIAGNOSIS — H16101 Unspecified superficial keratitis, right eye: Secondary | ICD-10-CM | POA: Diagnosis not present

## 2023-07-06 DIAGNOSIS — Z961 Presence of intraocular lens: Secondary | ICD-10-CM | POA: Diagnosis not present

## 2023-07-13 DIAGNOSIS — Z860100 Personal history of colon polyps, unspecified: Secondary | ICD-10-CM | POA: Diagnosis not present

## 2023-07-13 DIAGNOSIS — R197 Diarrhea, unspecified: Secondary | ICD-10-CM | POA: Diagnosis not present

## 2023-07-13 DIAGNOSIS — R140 Abdominal distension (gaseous): Secondary | ICD-10-CM | POA: Diagnosis not present

## 2023-08-07 ENCOUNTER — Encounter (HOSPITAL_BASED_OUTPATIENT_CLINIC_OR_DEPARTMENT_OTHER): Payer: Self-pay | Admitting: Emergency Medicine

## 2023-08-07 ENCOUNTER — Other Ambulatory Visit: Payer: Self-pay

## 2023-08-07 ENCOUNTER — Emergency Department (HOSPITAL_BASED_OUTPATIENT_CLINIC_OR_DEPARTMENT_OTHER)
Admission: EM | Admit: 2023-08-07 | Discharge: 2023-08-07 | Disposition: A | Discharge status: Home / Self Care | Payer: PPO | Attending: Emergency Medicine | Admitting: Emergency Medicine

## 2023-08-07 DIAGNOSIS — N189 Chronic kidney disease, unspecified: Secondary | ICD-10-CM | POA: Insufficient documentation

## 2023-08-07 DIAGNOSIS — Z20828 Contact with and (suspected) exposure to other viral communicable diseases: Secondary | ICD-10-CM | POA: Insufficient documentation

## 2023-08-07 DIAGNOSIS — Z794 Long term (current) use of insulin: Secondary | ICD-10-CM | POA: Diagnosis not present

## 2023-08-07 DIAGNOSIS — J029 Acute pharyngitis, unspecified: Secondary | ICD-10-CM | POA: Diagnosis present

## 2023-08-07 DIAGNOSIS — E1122 Type 2 diabetes mellitus with diabetic chronic kidney disease: Secondary | ICD-10-CM | POA: Diagnosis not present

## 2023-08-07 DIAGNOSIS — B9789 Other viral agents as the cause of diseases classified elsewhere: Secondary | ICD-10-CM | POA: Diagnosis not present

## 2023-08-07 DIAGNOSIS — R519 Headache, unspecified: Secondary | ICD-10-CM | POA: Insufficient documentation

## 2023-08-07 DIAGNOSIS — M25512 Pain in left shoulder: Secondary | ICD-10-CM | POA: Diagnosis not present

## 2023-08-07 DIAGNOSIS — Z7984 Long term (current) use of oral hypoglycemic drugs: Secondary | ICD-10-CM | POA: Diagnosis not present

## 2023-08-07 DIAGNOSIS — Z20822 Contact with and (suspected) exposure to covid-19: Secondary | ICD-10-CM | POA: Insufficient documentation

## 2023-08-07 LAB — RESP PANEL BY RT-PCR (RSV, FLU A&B, COVID)  RVPGX2
Influenza A by PCR: NEGATIVE
Influenza B by PCR: NEGATIVE
Resp Syncytial Virus by PCR: NEGATIVE
SARS Coronavirus 2 by RT PCR: NEGATIVE

## 2023-08-07 MED ORDER — ACETAMINOPHEN 325 MG PO TABS
650.0000 mg | ORAL_TABLET | Freq: Once | ORAL | Status: AC
  Administered 2023-08-07: 650 mg via ORAL
  Filled 2023-08-07: qty 2

## 2023-08-07 MED ORDER — ACETAMINOPHEN ER 650 MG PO TBCR: 650.0000 mg | EXTENDED_RELEASE_TABLET | Freq: Three times a day (TID) | ORAL | 0 refills | Status: AC | PRN

## 2023-08-07 NOTE — ED Triage Notes (Signed)
 Sore throat and headache x last night , spouse with RSV . recurrent left shoulder pain . Pain from shoulder to hand .

## 2023-08-07 NOTE — ED Provider Notes (Signed)
 [place] [facility] Provider Note   CSN: [id number] Arrival date & time: 08/07/23  1010     History  Chief Complaint  Patient presents with   Sore Throat    Shoulder pain    Catherine Bautista is a 73 y.o. female.  HPI    7 43-year-old patient comes in with chief complaint of sore throat and headache starting yesterday.  She has a spouse with RSV.  Patient also reports left-sided shoulder pain that started last night.  She had similar pain several weeks ago, PCP gave her a shot of pain medicine along with steroid and her symptoms had resolved.  She states that her husband recently acquired RSV and has been fairly sick and weak.  She had to help move around.  She suspects that she might have reaggravated the injury at that time, however there is no specific event that she can recall.  Pain is worse with movement.  No chest pain.  Home Medications Prior to Admission medications   Medication Sig Start Date End Date Taking? Authorizing Provider  acetaminophen  (TYLENOL  8 HOUR) 650 MG CR tablet Take 1 tablet (650 mg total) by mouth every 8 (eight) hours as needed for pain or fever. 08/07/23  Yes Charlyn Sora, MD  ACCU-CHEK SMARTVIEW test strip USE TO CHECK YOUR BLOOD SUGAR THREE TIMES A DAY DX E13.9 IN VITRO 90 DAYS 06/24/19   [provider]  amLODipine (NORVASC) 5 MG tablet Take 5 mg by mouth daily.    [provider]  atorvastatin  (LIPITOR ) 80 MG tablet Take 1 tablet (80 mg total) by mouth at bedtime. 02/22/20   Mikhail, Maryann, DO  b complex vitamins tablet Take 1 tablet by mouth daily.     [provider]  BD PEN NEEDLE NANO 2ND GEN 32G X 4 MM MISC USE TO TEST BLOOD SUGAR 3 TIMES DAILY 07/01/19   [provider]  Cholecalciferol  (VITAMIN D3) 50 MCG (2000 UT) TABS Take 2,000 Units by mouth daily.    [provider]  empagliflozin (JARDIANCE) 10 MG TABS tablet Take 10 mg by mouth daily.    [provider]   empagliflozin (JARDIANCE) 25 MG TABS tablet Take 25 mg by mouth daily.    [provider]  ferrous sulfate  325 (65 FE) MG tablet Take 325 mg by mouth daily with breakfast.    [provider]  glimepiride (AMARYL) 4 MG tablet Take 4 mg by mouth daily with breakfast.    [provider]  hydrochlorothiazide  (HYDRODIURIL ) 25 MG tablet Take 1 tablet (25 mg total) by mouth daily. 01/11/19   Vann, Jessica U, DO  Insulin  Detemir (LEVEMIR  FLEXTOUCH) 100 UNIT/ML Pen Inject 54 Units into the skin at bedtime.     [provider]  Insulin  Glargine w/ Trans Port (BASAGLAR TEMPO PEN) 100 UNIT/ML SOPN Inject 30 Units into the skin daily.    [provider]  Investigational - Study Medication Take 2 tablets by mouth 2 (two) times daily. Study name: BMS [id number] 25 mg, 100 mg, or placebo  Additional study details: Through Dr. Rosemarie    [provider]  loratadine (CLARITIN) 10 MG tablet Take 10 mg by mouth daily.    [provider]  Magnesium  300 MG CAPS Take 300 mg by mouth daily.    [provider]  metFORMIN (GLUCOPHAGE-XR) 750 MG 24 hr tablet Take 750 mg by mouth 2 (two) times a day.    [provider]  neomycin-bacitracin-polymyxin (NEOSPORIN) ointment Apply 1 application topically daily as needed for wound care.    [provider]  OMEGA 3 1200 MG CAPS Take 1,200 mg by mouth at bedtime.     [provider]  Prenatal Vit-Fe Fumarate-FA (PRENATAL PO) Take 1 tablet by mouth daily.    [provider]  ramipril  (ALTACE ) 10 MG capsule Take 10 mg by mouth daily.      [provider]  STUDY - AXIOMATIC - aspirin  100mg  (PI - Sethi) Take 100 mg by mouth daily.    [provider]  vitamin C  (ASCORBIC ACID ) 500 MG tablet Take 500 mg by mouth daily.    [provider]      Allergies    Canagliflozin, Demerol, Excedrin extra strength [aspirin -acetaminophen -caffeine], Janumet  [sitagliptin-metformin hcl], Melatonin, Morphine and codeine, Novocain [procaine hcl], and Procaine    Review of Systems   Review of Systems  All other systems reviewed and are negative.   Physical Exam Updated Vital Signs BP 138/64   Pulse (!) 59   Temp (!) 97.5 F (36.4 C)   Resp 16   Wt 116.6 kg   SpO2 98%   BMI 41.48 kg/m  Physical Exam Vitals and nursing note reviewed.  Constitutional:      Appearance: She is well-developed.  HENT:     Head: Atraumatic.     Mouth/Throat:     Mouth: Mucous membranes are moist. No oral lesions.     Tonsils: No tonsillar exudate.  Cardiovascular:     Rate and Rhythm: Normal rate.  Pulmonary:     Effort: Pulmonary effort is normal.  Musculoskeletal:     Cervical back: Normal range of motion and neck supple.  Lymphadenopathy:     Cervical: No cervical adenopathy.  Skin:    General: Skin is warm and dry.     Comments: Patient has reproducible tenderness with abduction and forward flexion of the left upper extremity, above 90 degrees.  She also has paraspinal tenderness over the left paracervical region  Neurological:     Mental Status: She is alert and oriented to person, place, and time.     ED Results / Procedures / Treatments   Labs (all labs ordered are listed, but only abnormal results are displayed) Labs Reviewed  RESP PANEL BY RT-PCR (RSV, FLU A&B, COVID)  [id number]    EKG EKG Interpretation Date/Time:  Monday August 07 2023 13:49:07 EST Ventricular Rate:  63 PR Interval:  227 QRS Duration:  132 QT Interval:  438 QTC Calculation: 449 R Axis:   -69  Text Interpretation: Sinus rhythm Prolonged PR interval Nonspecific IVCD with LAD No acute changes No significant change since last tracing Confirmed by Charlyn Sora [id number]) on 08/07/2023 1:57:22 PM  Radiology No results found.  Procedures Procedures    Medications Ordered in ED Medications  acetaminophen  (TYLENOL ) tablet 650 mg (650 mg Oral Given 08/07/23 1113)     ED Course/ Medical Decision Making/ A&P                                 Medical Decision Making Risk OTC drugs.   73 year old patient comes in with chief complaint of left-sided shoulder pain and also sore throat.  Her husband recently was diagnosed with RSV.  She has mild cough, but mostly sore throat.  Also complains of left-sided shoulder pain without chest pain.  She has history of shoulder  pain in the past.  I reviewed patient's records including her past medical history.  Patient has CKD, diabetes.  Her shoulder pain is reproducible with movement.  Although I considered ACS is a possibility, I do not think patient has acute coronary syndrome.  I do not think labs are indicated.  RSV/COVID test has been ordered.  X-ray of the shoulder considered, but I do not think it would be beneficial now.  We will get an EKG.  Patient has no cervical lymphadenopathy and no exudates, no fever.  Low concerns clinically for strep infection at this time.  Final Clinical Impression(s) / ED Diagnoses Final diagnoses:  Viral pharyngitis  Acute pain of left shoulder    Rx / DC Orders ED Discharge Orders          Ordered    acetaminophen  (TYLENOL  8 HOUR) 650 MG CR tablet  Every 8 hours PRN        08/07/23 1401              Charlyn Sora, MD 08/07/23 1401

## 2023-08-07 NOTE — Discharge Instructions (Addendum)
 You were seen in the for shoulder pain and the sore throat.  Your RSV test, flu test, COVID test is negative.  We recommend that you follow-up with your PCP for shoulder pain.  Orthopedic surgery phone number also provided.  If your symptoms are worsening, then consider seeing the orthopedic doctor in 7 to 10 days.  Take Tylenol  for pain control.

## 2023-08-07 NOTE — ED Notes (Signed)
 Pt update and explained that they are working on resp panel and that it would not be much longer, pt given ginger ale and cranberry juice

## 2023-08-11 DIAGNOSIS — N184 Chronic kidney disease, stage 4 (severe): Secondary | ICD-10-CM | POA: Diagnosis not present

## 2023-09-02 ENCOUNTER — Encounter (HOSPITAL_BASED_OUTPATIENT_CLINIC_OR_DEPARTMENT_OTHER): Payer: Self-pay

## 2023-09-02 ENCOUNTER — Emergency Department (HOSPITAL_COMMUNITY): Payer: PPO

## 2023-09-02 ENCOUNTER — Other Ambulatory Visit: Payer: Self-pay

## 2023-09-02 ENCOUNTER — Emergency Department (HOSPITAL_BASED_OUTPATIENT_CLINIC_OR_DEPARTMENT_OTHER)
Admission: EM | Admit: 2023-09-02 | Discharge: 2023-09-02 | Disposition: A | Discharge status: Home / Self Care | Payer: PPO | Attending: Emergency Medicine | Admitting: Emergency Medicine

## 2023-09-02 ENCOUNTER — Emergency Department (HOSPITAL_BASED_OUTPATIENT_CLINIC_OR_DEPARTMENT_OTHER): Payer: PPO

## 2023-09-02 DIAGNOSIS — R202 Paresthesia of skin: Secondary | ICD-10-CM | POA: Diagnosis present

## 2023-09-02 DIAGNOSIS — G9389 Other specified disorders of brain: Secondary | ICD-10-CM | POA: Insufficient documentation

## 2023-09-02 DIAGNOSIS — D649 Anemia, unspecified: Secondary | ICD-10-CM | POA: Insufficient documentation

## 2023-09-02 DIAGNOSIS — Z7984 Long term (current) use of oral hypoglycemic drugs: Secondary | ICD-10-CM | POA: Diagnosis not present

## 2023-09-02 DIAGNOSIS — Z794 Long term (current) use of insulin: Secondary | ICD-10-CM | POA: Insufficient documentation

## 2023-09-02 DIAGNOSIS — E119 Type 2 diabetes mellitus without complications: Secondary | ICD-10-CM | POA: Diagnosis not present

## 2023-09-02 DIAGNOSIS — I10 Essential (primary) hypertension: Secondary | ICD-10-CM | POA: Insufficient documentation

## 2023-09-02 DIAGNOSIS — Z79899 Other long term (current) drug therapy: Secondary | ICD-10-CM | POA: Insufficient documentation

## 2023-09-02 DIAGNOSIS — Z7902 Long term (current) use of antithrombotics/antiplatelets: Secondary | ICD-10-CM | POA: Diagnosis not present

## 2023-09-02 DIAGNOSIS — I69398 Other sequelae of cerebral infarction: Secondary | ICD-10-CM | POA: Diagnosis not present

## 2023-09-02 DIAGNOSIS — R944 Abnormal results of kidney function studies: Secondary | ICD-10-CM | POA: Diagnosis not present

## 2023-09-02 LAB — DIFFERENTIAL
Abs Immature Granulocytes: 0.01 10*3/uL (ref 0.00–0.07)
Basophils Absolute: 0 10*3/uL (ref 0.0–0.1)
Basophils Relative: 1 %
Eosinophils Absolute: 0.1 10*3/uL (ref 0.0–0.5)
Eosinophils Relative: 1 %
Immature Granulocytes: 0 %
Lymphocytes Relative: 27 %
Lymphs Abs: 1.6 10*3/uL (ref 0.7–4.0)
Monocytes Absolute: 0.4 10*3/uL (ref 0.1–1.0)
Monocytes Relative: 7 %
Neutro Abs: 3.8 10*3/uL (ref 1.7–7.7)
Neutrophils Relative %: 64 %

## 2023-09-02 LAB — CBC
HCT: 36.6 % (ref 36.0–46.0)
Hemoglobin: 11.6 g/dL — ABNORMAL LOW (ref 12.0–15.0)
MCH: 28.8 pg (ref 26.0–34.0)
MCHC: 31.7 g/dL (ref 30.0–36.0)
MCV: 90.8 fL (ref 80.0–100.0)
Platelets: 257 10*3/uL (ref 150–400)
RBC: 4.03 MIL/uL (ref 3.87–5.11)
RDW: 14.6 % (ref 11.5–15.5)
WBC: 5.9 10*3/uL (ref 4.0–10.5)
nRBC: 0 % (ref 0.0–0.2)

## 2023-09-02 LAB — PROTIME-INR
INR: 1 (ref 0.8–1.2)
Prothrombin Time: 13 s (ref 11.4–15.2)

## 2023-09-02 LAB — COMPREHENSIVE METABOLIC PANEL
ALT: 32 U/L (ref 0–44)
AST: 24 U/L (ref 15–41)
Albumin: 3.4 g/dL — ABNORMAL LOW (ref 3.5–5.0)
Alkaline Phosphatase: 118 U/L (ref 38–126)
Anion gap: 6 (ref 5–15)
BUN: 23 mg/dL (ref 8–23)
CO2: 23 mmol/L (ref 22–32)
Calcium: 8.6 mg/dL — ABNORMAL LOW (ref 8.9–10.3)
Chloride: 111 mmol/L (ref 98–111)
Creatinine, Ser: 1.89 mg/dL — ABNORMAL HIGH (ref 0.44–1.00)
GFR, Estimated: 28 mL/min — ABNORMAL LOW (ref >60)
Glucose, Bld: 91 mg/dL (ref 70–99)
Potassium: 4.4 mmol/L (ref 3.5–5.1)
Sodium: 140 mmol/L (ref 135–145)
Total Bilirubin: 0.5 mg/dL (ref 0.0–1.2)
Total Protein: 6.8 g/dL (ref 6.5–8.1)

## 2023-09-02 LAB — CBG MONITORING, ED: Glucose-Capillary: 106 mg/dL — ABNORMAL HIGH (ref 70–99)

## 2023-09-02 LAB — APTT: aPTT: 27 s (ref 24–36)

## 2023-09-02 NOTE — ED Notes (Signed)
 Patient transported to MRI

## 2023-09-02 NOTE — ED Provider Notes (Signed)
[place] EMERGENCY DEPARTMENT AT [facility] Provider Note   CSN: [id number] Arrival date & time: 09/02/23  7829     History  Chief Complaint  Patient presents with   Tingling    Catherine Bautista is a 73 y.o. female.  Patient with history of hypertension, diabetes, hyperlipidemia, previous stroke with residual right hand fatigue (with activities) and sometimes difficulty finding words, on Plavix (reports compliance) -- presents to the emergency department today for evaluation of paresthesias in the left upper and lower extremities as well as a "cold sensation" in these extremities.  These symptoms started 2 days ago.  No new weakness.  She has had headaches recently, approximately once per week, but no current headache.  No neck pain.  No chest pain or shortness of breath.  No infectious type symptoms such as fever.  No treatments prior to arrival.       Home Medications Prior to Admission medications   Medication Sig Start Date End Date Taking? Authorizing Provider  ACCU-CHEK SMARTVIEW test strip USE TO CHECK YOUR BLOOD SUGAR THREE TIMES A DAY DX E13.9 IN VITRO 90 DAYS 06/24/19   [provider]  acetaminophen (TYLENOL 8 HOUR) 650 MG CR tablet Take 1 tablet (650 mg total) by mouth every 8 (eight) hours as needed for pain or fever. 08/07/23   Derwood Kaplan, MD  amLODipine (NORVASC) 5 MG tablet Take 5 mg by mouth daily.    [provider]  atorvastatin (LIPITOR) 80 MG tablet Take 1 tablet (80 mg total) by mouth at bedtime. 02/22/20   Edsel Petrin, DO  b complex vitamins tablet Take 1 tablet by mouth daily.     [provider]  BD PEN NEEDLE NANO 2ND GEN 32G X 4 MM MISC USE TO TEST BLOOD SUGAR 3 TIMES DAILY 07/01/19   [provider]  Cholecalciferol (VITAMIN D3) 50 MCG (2000 UT) TABS Take 2,000 Units by mouth daily.    [provider]  empagliflozin (JARDIANCE) 10 MG TABS tablet Take 10 mg by mouth daily.    [provider]   empagliflozin (JARDIANCE) 25 MG TABS tablet Take 25 mg by mouth daily.    [provider]  ferrous sulfate 325 (65 FE) MG tablet Take 325 mg by mouth daily with breakfast.    [provider]  glimepiride (AMARYL) 4 MG tablet Take 4 mg by mouth daily with breakfast.    [provider]  hydrochlorothiazide (HYDRODIURIL) 25 MG tablet Take 1 tablet (25 mg total) by mouth daily. 01/11/19   Joseph Art, DO  Insulin Detemir (LEVEMIR FLEXTOUCH) 100 UNIT/ML Pen Inject 54 Units into the skin at bedtime.     [provider]  Insulin Glargine w/ Trans Port (BASAGLAR TEMPO PEN) 100 UNIT/ML SOPN Inject 30 Units into the skin daily.    [provider]  Investigational - Study Medication Take 2 tablets by mouth 2 (two) times daily. Study name: [id number] 25 mg, 100 mg, or placebo  Additional study details: Through Dr. Pearlean Brownie    [provider]  loratadine (CLARITIN) 10 MG tablet Take 10 mg by mouth daily.    [provider]  Magnesium 300 MG CAPS Take 300 mg by mouth daily.    [provider]  metFORMIN (GLUCOPHAGE-XR) 750 MG 24 hr tablet Take 750 mg by mouth 2 (two) times a day.    [provider]  neomycin-bacitracin-polymyxin (NEOSPORIN) ointment Apply 1 application topically daily as needed for wound care.  [provider]  OMEGA 3 1200 MG CAPS Take 1,200 mg by mouth at bedtime.     [provider]  Prenatal Vit-Fe Fumarate-FA (PRENATAL PO) Take 1 tablet by mouth daily.    [provider]  ramipril (ALTACE) 10 MG capsule Take 10 mg by mouth daily.      [provider]  STUDY - AXIOMATIC - aspirin 100mg  (PI - Sethi) Take 100 mg by mouth daily.    [provider]  vitamin C (ASCORBIC ACID) 500 MG tablet Take 500 mg by mouth daily.    [provider]      Allergies    Canagliflozin, Demerol, Excedrin extra strength [aspirin-acetaminophen-caffeine], Janumet  [sitagliptin-metformin hcl], Melatonin, Morphine and codeine, Novocain [procaine hcl], and Procaine    Review of Systems   Review of Systems  Physical Exam Updated Vital Signs BP (!) 179/96 (BP Location: Right Arm)   Pulse 68   Temp 97.7 F (36.5 C)   Resp 20   Ht 5\' 6"  (1.676 m)   Wt 120.7 kg   SpO2 100%   BMI 42.93 kg/m  Physical Exam Vitals and nursing note reviewed.  Constitutional:      Appearance: She is well-developed.  HENT:     Head: Normocephalic and atraumatic.     Right Ear: External ear normal.     Left Ear: External ear normal.     Nose: Nose normal.     Mouth/Throat:     Pharynx: Uvula midline.  Eyes:     General: Lids are normal.     Extraocular Movements:     Right eye: No nystagmus.     Left eye: No nystagmus.     Conjunctiva/sclera: Conjunctivae normal.     Pupils: Pupils are equal, round, and reactive to light.  Cardiovascular:     Rate and Rhythm: Normal rate and regular rhythm.  Pulmonary:     Effort: Pulmonary effort is normal.     Breath sounds: Normal breath sounds.  Abdominal:     Palpations: Abdomen is soft.     Tenderness: There is no abdominal tenderness.  Musculoskeletal:     Cervical back: Normal range of motion and neck supple. No tenderness or bony tenderness.  Skin:    General: Skin is warm and dry.  Neurological:     Mental Status: She is alert and oriented to person, place, and time.     GCS: GCS eye subscore is 4. GCS verbal subscore is 5. GCS motor subscore is 6.     Cranial Nerves: No cranial nerve deficit.     Sensory: Sensory deficit present.     Motor: No weakness.     Coordination: Coordination normal.     Gait: Gait normal.     Comments: Upper extremity myotomes tested bilaterally:  C5 Shoulder abduction 5/5 C6 Elbow flexion/wrist extension 5/5 C7 Elbow extension 5/5 C8 Finger flexion 5/5 T1 Finger abduction 5/5  Lower extremity myotomes tested bilaterally: L2 Hip flexion 5/5 L3 Knee extension 5/5 L4 Ankle  dorsiflexion 5/5 S1 Ankle plantar flexion 5/5      ED Results / Procedures / Treatments   Labs (all labs ordered are listed, but only abnormal results are displayed) Labs Reviewed  CBC - Abnormal; Notable for the following components:      Result Value   Hemoglobin 11.6 (*)    All other components within normal limits  COMPREHENSIVE METABOLIC PANEL - Abnormal; Notable for the following components:   Creatinine, Ser 1.89 (*)  Calcium 8.6 (*)    Albumin 3.4 (*)    GFR, Estimated 28 (*)    All other components within normal limits  CBG MONITORING, ED - Abnormal; Notable for the following components:   Glucose-Capillary 106 (*)    All other components within normal limits  PROTIME-INR  APTT  DIFFERENTIAL    EKG None  Radiology CT Head Wo Contrast Result Date: 09/02/2023 CLINICAL DATA:  Neuro deficit, acute, stroke suspected left sided paresthesia, h/o CVA EXAM: CT HEAD WITHOUT CONTRAST TECHNIQUE: Contiguous axial images were obtained from the base of the skull through the vertex without intravenous contrast. RADIATION DOSE REDUCTION: This exam was performed according to the departmental dose-optimization program which includes automated exposure control, adjustment of the mA and/or kV according to patient size and/or use of iterative reconstruction technique. COMPARISON:  05/15/2021 FINDINGS: Brain: No evidence of acute infarction, hemorrhage, hydrocephalus, extra-axial collection or mass lesion/mass effect. Chronic left temporoparietal encephalomalacia and small remote left frontal cortical infarct. Vascular: Atherosclerotic calcifications involving the large vessels of the skull base. No unexpected hyperdense vessel. Skull: Normal. Negative for fracture or focal lesion. Sinuses/Orbits: No acute finding. Other: None. IMPRESSION: 1. No acute intracranial findings. 2. Chronic left temporoparietal encephalomalacia and small remote left frontal cortical infarct. Electronically Signed   By:  Duanne Guess D.O.   On: 09/02/2023 11:59    Procedures Procedures    Medications Ordered in ED Medications - No data to display  ED Course/ Medical Decision Making/ A&P    Patient seen and examined. History obtained directly from patient.   Labs/EKG: Ordered stroke panel, including PT/INR and aPTT as part of stroke panel.   Imaging: Debating on head CT w/o vs ct angio head neck. Patient's creatinine may not support.   Medications/Fluids: None ordered.   Most recent vital signs reviewed and are as follows: BP (!) 179/96 (BP Location: Right Arm)   Pulse 68   Temp 97.7 F (36.5 C)   Resp 20   Ht 5\' 6"  (1.676 m)   Wt 120.7 kg   SpO2 100%   BMI 42.93 kg/m   Initial impression: L sided paresthesia, need to r/o stroke  12:52 PM Reassessment performed. Patient appears stable.  Symptoms still present.  Labs personally reviewed and interpreted including: CBC demonstrates mild anemia hemoglobin 11.6 with normal differential; CMP with elevated creatinine at 1.89, slightly better than patient's baseline recently otherwise unremarkable; PT INR and APTT ordered per stroke protocol, all normal.  Imaging personally visualized and interpreted including: CT head, agree old strokes noted, no acute findings  Reviewed pertinent lab work and imaging with patient at bedside. Questions answered.   Most current vital signs reviewed and are as follows: BP (!) 150/70 (BP Location: Right Arm)   Pulse 63   Temp 97.7 F (36.5 C)   Resp 18   Ht 5\' 6"  (1.676 m)   Wt 120.7 kg   SpO2 98%   BMI 42.93 kg/m   Plan: Discussed results with patient and family member at bedside.  Discussed need for additional imaging to rule out small ischemic stroke given her history and current symptoms.  She will need to be transferred for an MRI.  We discussed ambulance versus POV transport.  We discussed risks and benefits.  Patient prefers to go by private vehicle.  Her family member will drive her.  Dr[facility] Jacqulyn Bath  at [facility] accepting.  I have ordered MRI brain and cervical spine.  Patient will go directly to the emergency department  there.  Would likely need neuro consult with any abnormality. If neg, can likely be discharged with outpatient follow-up.                                  Medical Decision Making Amount and/or Complexity of Data Reviewed Labs: ordered. Radiology: ordered.   Patient with history of stroke with left upper and lower extremity paresthesias.  Initial workup reassuring.  She will need MRI to rule out ischemic stroke or cervical spine etiology.  Transfer for further testing.        Final Clinical Impression(s) / ED Diagnoses Final diagnoses:  Paresthesia of left arm and leg    Rx / DC Orders ED Discharge Orders     None         Renne Crigler, PA-C 09/02/23 1306    Rozelle Logan, DO 09/03/23 [phone]

## 2023-09-02 NOTE — ED Provider Notes (Signed)
Accepted as transfer from outside facility, patient with left-sided paresthesia and left leg tingling for 2 days.  Endorses some left foot soreness as well.  CT head without contrast shows old stroke but no acute findings, lab work otherwise unremarkable, creatinine slightly improved from baseline.  Pending MR brain and C-spine on arrival to ED.  Briefly: Patient is 73 y.o.   DDX: concern for left-sided paresthesias for 2 days with some temperature sensation differences.  Plan: I independently interpreted MRI of the brain, cervical spine which showed unchanged cervical stenosis, no significant cord compression, no significant herniated disc, no evidence of acute stroke.  Discussed possible some degree of cervical radiculopathy versus other peripheral nerve compression, encourage close neurology follow-up.  Discussed extensive return precautions.  Considered steroids but she has poorly controlled blood pressure in the emergency department and reports that her blood sugar is very sensitive    [place] 09/02/23 2114    Gwyneth Sprout, MD 09/04/23 0001

## 2023-09-02 NOTE — ED Notes (Signed)
Patient requested IV be placed in Left Arm

## 2023-09-02 NOTE — ED Triage Notes (Signed)
Patient arrives ambulatory to the ED with complaints of left arm and left leg tingling x2 days.  Patient reports left foot soreness a 6/10.

## 2023-09-02 NOTE — Discharge Instructions (Signed)
Please use Tylenol for pain.  You may use 1000 mg of Tylenol every 6 hours.  Not to exceed 4 g of Tylenol within 24 hours.  I do think heating pad, rest, decreased physical activity may help if there is some peripheral nerve compression that is contributing to your paresthesias.  Recommend following up closely with your neurologist.  They may consider if the symptoms continue placing you on a short course of prednisone or another steroid to help with the inflammation that may be contributing to your paresthesias.  As we discussed given your high blood pressure and diabetes I want to hold off on immediately placing you on a steroid to prevent these values from increasing.

## 2023-10-04 DIAGNOSIS — N8111 Cystocele, midline: Secondary | ICD-10-CM | POA: Diagnosis not present

## 2023-10-04 DIAGNOSIS — Z01419 Encounter for gynecological examination (general) (routine) without abnormal findings: Secondary | ICD-10-CM | POA: Diagnosis not present

## 2023-10-04 DIAGNOSIS — N952 Postmenopausal atrophic vaginitis: Secondary | ICD-10-CM | POA: Diagnosis not present

## 2023-10-04 DIAGNOSIS — N898 Other specified noninflammatory disorders of vagina: Secondary | ICD-10-CM | POA: Diagnosis not present

## 2023-10-28 DIAGNOSIS — K1121 Acute sialoadenitis: Secondary | ICD-10-CM | POA: Diagnosis not present

## 2023-10-28 DIAGNOSIS — F4321 Adjustment disorder with depressed mood: Secondary | ICD-10-CM | POA: Diagnosis not present

## 2023-11-14 DIAGNOSIS — N184 Chronic kidney disease, stage 4 (severe): Secondary | ICD-10-CM | POA: Diagnosis not present

## 2023-11-14 DIAGNOSIS — E559 Vitamin D deficiency, unspecified: Secondary | ICD-10-CM | POA: Diagnosis not present

## 2023-11-22 DIAGNOSIS — R7401 Elevation of levels of liver transaminase levels: Secondary | ICD-10-CM | POA: Diagnosis not present

## 2023-11-22 DIAGNOSIS — D631 Anemia in chronic kidney disease: Secondary | ICD-10-CM | POA: Diagnosis not present

## 2023-11-22 DIAGNOSIS — E1122 Type 2 diabetes mellitus with diabetic chronic kidney disease: Secondary | ICD-10-CM | POA: Diagnosis not present

## 2023-11-22 DIAGNOSIS — N184 Chronic kidney disease, stage 4 (severe): Secondary | ICD-10-CM | POA: Diagnosis not present

## 2023-11-22 DIAGNOSIS — E559 Vitamin D deficiency, unspecified: Secondary | ICD-10-CM | POA: Diagnosis not present

## 2023-11-22 DIAGNOSIS — E872 Acidosis, unspecified: Secondary | ICD-10-CM | POA: Diagnosis not present

## 2023-11-22 DIAGNOSIS — R768 Other specified abnormal immunological findings in serum: Secondary | ICD-10-CM | POA: Diagnosis not present

## 2023-11-22 DIAGNOSIS — N189 Chronic kidney disease, unspecified: Secondary | ICD-10-CM | POA: Diagnosis not present

## 2023-11-22 DIAGNOSIS — R809 Proteinuria, unspecified: Secondary | ICD-10-CM | POA: Diagnosis not present

## 2023-11-22 DIAGNOSIS — N2581 Secondary hyperparathyroidism of renal origin: Secondary | ICD-10-CM | POA: Diagnosis not present

## 2023-11-22 DIAGNOSIS — I129 Hypertensive chronic kidney disease with stage 1 through stage 4 chronic kidney disease, or unspecified chronic kidney disease: Secondary | ICD-10-CM | POA: Diagnosis not present

## 2023-11-27 DIAGNOSIS — F4320 Adjustment disorder, unspecified: Secondary | ICD-10-CM | POA: Diagnosis not present

## 2023-11-27 DIAGNOSIS — I10 Essential (primary) hypertension: Secondary | ICD-10-CM | POA: Diagnosis not present

## 2023-11-27 DIAGNOSIS — G4733 Obstructive sleep apnea (adult) (pediatric): Secondary | ICD-10-CM | POA: Diagnosis not present

## 2023-12-11 DIAGNOSIS — G4733 Obstructive sleep apnea (adult) (pediatric): Secondary | ICD-10-CM | POA: Diagnosis not present

## 2024-01-22 DIAGNOSIS — N184 Chronic kidney disease, stage 4 (severe): Secondary | ICD-10-CM | POA: Diagnosis not present

## 2024-01-27 ENCOUNTER — Encounter (HOSPITAL_COMMUNITY): Payer: Self-pay | Admitting: Interventional Radiology

## 2024-01-29 DIAGNOSIS — R197 Diarrhea, unspecified: Secondary | ICD-10-CM | POA: Diagnosis not present

## 2024-01-29 DIAGNOSIS — Z860100 Personal history of colon polyps, unspecified: Secondary | ICD-10-CM | POA: Diagnosis not present

## 2024-01-29 DIAGNOSIS — R140 Abdominal distension (gaseous): Secondary | ICD-10-CM | POA: Diagnosis not present

## 2024-01-31 DIAGNOSIS — E872 Acidosis, unspecified: Secondary | ICD-10-CM | POA: Diagnosis not present

## 2024-01-31 DIAGNOSIS — R809 Proteinuria, unspecified: Secondary | ICD-10-CM | POA: Diagnosis not present

## 2024-01-31 DIAGNOSIS — I129 Hypertensive chronic kidney disease with stage 1 through stage 4 chronic kidney disease, or unspecified chronic kidney disease: Secondary | ICD-10-CM | POA: Diagnosis not present

## 2024-01-31 DIAGNOSIS — E1122 Type 2 diabetes mellitus with diabetic chronic kidney disease: Secondary | ICD-10-CM | POA: Diagnosis not present

## 2024-01-31 DIAGNOSIS — N184 Chronic kidney disease, stage 4 (severe): Secondary | ICD-10-CM | POA: Diagnosis not present

## 2024-01-31 DIAGNOSIS — N2581 Secondary hyperparathyroidism of renal origin: Secondary | ICD-10-CM | POA: Diagnosis not present

## 2024-01-31 DIAGNOSIS — R768 Other specified abnormal immunological findings in serum: Secondary | ICD-10-CM | POA: Diagnosis not present

## 2024-01-31 DIAGNOSIS — R7401 Elevation of levels of liver transaminase levels: Secondary | ICD-10-CM | POA: Diagnosis not present

## 2024-01-31 DIAGNOSIS — E559 Vitamin D deficiency, unspecified: Secondary | ICD-10-CM | POA: Diagnosis not present

## 2024-01-31 DIAGNOSIS — D631 Anemia in chronic kidney disease: Secondary | ICD-10-CM | POA: Diagnosis not present

## 2024-04-08 DIAGNOSIS — R208 Other disturbances of skin sensation: Secondary | ICD-10-CM | POA: Diagnosis not present

## 2024-04-08 DIAGNOSIS — M79671 Pain in right foot: Secondary | ICD-10-CM | POA: Diagnosis not present

## 2024-04-15 DIAGNOSIS — M25579 Pain in unspecified ankle and joints of unspecified foot: Secondary | ICD-10-CM | POA: Diagnosis not present

## 2024-04-22 ENCOUNTER — Ambulatory Visit (INDEPENDENT_AMBULATORY_CARE_PROVIDER_SITE_OTHER)

## 2024-04-22 ENCOUNTER — Other Ambulatory Visit: Payer: Self-pay | Admitting: Podiatry

## 2024-04-22 ENCOUNTER — Ambulatory Visit (INDEPENDENT_AMBULATORY_CARE_PROVIDER_SITE_OTHER): Admitting: Podiatry

## 2024-04-22 ENCOUNTER — Encounter: Payer: Self-pay | Admitting: Podiatry

## 2024-04-22 DIAGNOSIS — M7661 Achilles tendinitis, right leg: Secondary | ICD-10-CM

## 2024-04-22 DIAGNOSIS — M722 Plantar fascial fibromatosis: Secondary | ICD-10-CM

## 2024-04-22 DIAGNOSIS — M7731 Calcaneal spur, right foot: Secondary | ICD-10-CM

## 2024-04-22 MED ORDER — PREDNISONE 5 MG PO TABS: ORAL_TABLET | ORAL | 1 refills | Status: AC

## 2024-04-22 MED ORDER — PREDNISONE 5 MG PO TABS: ORAL_TABLET | ORAL | 0 refills | Status: DC

## 2024-04-22 NOTE — Patient Instructions (Signed)

## 2024-04-22 NOTE — Progress Notes (Signed)
 Patient presents complaint of pain along the Achilles tendon and rear foot on the right foot.  Says began hurting several weeks ago.  Says been getting a little better the past week.  There is she has swelling in the legs chronically.  Has not noticed any redness or ecchymosis.   Physical exam:  General appearance: Pleasant, and in no acute distress. AOx3.  Vascular: Pedal pulses: DP 2/4 bilaterally, PT 1/4 bilaterally. Severe edema lower legs bilaterally. Capillary fill time immediate bilaterally.  Neurological: Light touch intact feet bilaterally.  Normal Achilles reflex bilaterally.  No clonus or spasticity noted.  Negative Tinel's sign tarsal tunnel and porta pedis bilaterally  Dermatologic:   Skin normal temperature bilaterally.  Skin normal color, tone, and texture bilaterally.   Musculoskeletal: Tenderness at the insertion of the Achilles tendon on the distal 5 to 6 cm of the Achilles tendon.  No defects in Achilles tendon palpable.  Normal muscle strength lower extremity bilaterally.  Some tenderness at the plantar lateral calcaneal tubercle right.  Radiographs: 3 views foot right: Osteophytic changes posterior aspect calcaneus.  Increased soft tissue density due to swelling chronic edema and some calcification of subcutaneous structures.  Notes any fractures or dislocations.  Contours of Achilles tendon are visible.  Kager's triangle is difficult to evaluate due to the calcification within the soft tissue.  Diagnosis: 1.  Achilles tendinitis right. 2.  Calcaneal spur right.  Plan: -New patient office visit for evaluation and management level 3. - Discussed the Achilles tendinitis etiology and treatment.  Wear good supportive shoes.  Avoid flat soled shoes or shoes with no support.  Will give instructions for at home exercises she can do. -RICE -Rx prednisone  5 mg, 30 mg p.o. daily first day, then decrease by 5 mg every other day for 12 days. -Gave written exercises and at  home therapy she can do.  Return 2 weeks follow-up Achilles tendinitis right

## 2024-04-29 DIAGNOSIS — N184 Chronic kidney disease, stage 4 (severe): Secondary | ICD-10-CM | POA: Diagnosis not present

## 2024-04-30 DIAGNOSIS — R140 Abdominal distension (gaseous): Secondary | ICD-10-CM | POA: Diagnosis not present

## 2024-04-30 DIAGNOSIS — R195 Other fecal abnormalities: Secondary | ICD-10-CM | POA: Diagnosis not present

## 2024-04-30 DIAGNOSIS — Z860100 Personal history of colon polyps, unspecified: Secondary | ICD-10-CM | POA: Diagnosis not present

## 2024-05-06 ENCOUNTER — Ambulatory Visit: Admitting: Podiatry

## 2024-05-06 DIAGNOSIS — M7661 Achilles tendinitis, right leg: Secondary | ICD-10-CM | POA: Diagnosis not present

## 2024-05-06 NOTE — Progress Notes (Signed)
 Patient presents follow-up Achilles tendinitis right.  Doing much better.  Just a little soreness and tightness at times.   Physical exam:  General appearance: Pleasant, and in no acute distress. AOx3.  Vascular: Pedal pulses: DP 2/4 bilaterally, PT 2/4 bilaterally.  Mild to moderate edema lower legs bilaterally. Capillary fill time immediate right.  Neurological: Grossly intact bilaterally  Dermatologic:   Skin normal temperature bilaterally.  Skin normal color, tone, and texture bilaterally.   Musculoskeletal: Slight soreness at the insertion of the Achilles right.   Diagnosis: 1.  Achilles tendinitis right-improved, almost resolved  Plan: -Established office visit for evaluation and management level 3. -I discussed with her wearing proper shoes.  Since she went out got some new Hoka's.  Also recommend continue doing the stretches once or twice a day daily just to keep this from flaring back up again. -Ice and Voltaren gel as needed  Return as needed

## 2024-05-09 DIAGNOSIS — E559 Vitamin D deficiency, unspecified: Secondary | ICD-10-CM | POA: Diagnosis not present

## 2024-05-09 DIAGNOSIS — I129 Hypertensive chronic kidney disease with stage 1 through stage 4 chronic kidney disease, or unspecified chronic kidney disease: Secondary | ICD-10-CM | POA: Diagnosis not present

## 2024-05-09 DIAGNOSIS — E872 Acidosis, unspecified: Secondary | ICD-10-CM | POA: Diagnosis not present

## 2024-05-09 DIAGNOSIS — E1122 Type 2 diabetes mellitus with diabetic chronic kidney disease: Secondary | ICD-10-CM | POA: Diagnosis not present

## 2024-05-09 DIAGNOSIS — D631 Anemia in chronic kidney disease: Secondary | ICD-10-CM | POA: Diagnosis not present

## 2024-05-09 DIAGNOSIS — N2581 Secondary hyperparathyroidism of renal origin: Secondary | ICD-10-CM | POA: Diagnosis not present

## 2024-05-09 DIAGNOSIS — R809 Proteinuria, unspecified: Secondary | ICD-10-CM | POA: Diagnosis not present

## 2024-05-09 DIAGNOSIS — N184 Chronic kidney disease, stage 4 (severe): Secondary | ICD-10-CM | POA: Diagnosis not present

## 2024-05-09 DIAGNOSIS — N189 Chronic kidney disease, unspecified: Secondary | ICD-10-CM | POA: Diagnosis not present

## 2024-05-30 DIAGNOSIS — Z1231 Encounter for screening mammogram for malignant neoplasm of breast: Secondary | ICD-10-CM | POA: Diagnosis not present

## 2024-06-10 DIAGNOSIS — H16101 Unspecified superficial keratitis, right eye: Secondary | ICD-10-CM | POA: Diagnosis not present

## 2024-06-10 DIAGNOSIS — E119 Type 2 diabetes mellitus without complications: Secondary | ICD-10-CM | POA: Diagnosis not present

## 2024-06-10 DIAGNOSIS — Z961 Presence of intraocular lens: Secondary | ICD-10-CM | POA: Diagnosis not present

## 2024-06-23 DIAGNOSIS — S46811A Strain of other muscles, fascia and tendons at shoulder and upper arm level, right arm, initial encounter: Secondary | ICD-10-CM | POA: Diagnosis not present

## 2024-07-12 DIAGNOSIS — I10 Essential (primary) hypertension: Secondary | ICD-10-CM | POA: Diagnosis not present

## 2024-07-12 DIAGNOSIS — E1122 Type 2 diabetes mellitus with diabetic chronic kidney disease: Secondary | ICD-10-CM | POA: Diagnosis not present

## 2024-07-12 DIAGNOSIS — E7800 Pure hypercholesterolemia, unspecified: Secondary | ICD-10-CM | POA: Diagnosis not present

## 2024-07-12 DIAGNOSIS — N184 Chronic kidney disease, stage 4 (severe): Secondary | ICD-10-CM | POA: Diagnosis not present

## 2024-07-12 DIAGNOSIS — E039 Hypothyroidism, unspecified: Secondary | ICD-10-CM | POA: Diagnosis not present

## 2024-08-08 NOTE — Progress Notes (Signed)
 Catherine Bautista                                          MRN: [id number]   08/08/2024   The [facility] Quality Team Specialist reviewed this patient medical record for the purposes of chart review for care gap closure. The following were reviewed: chart review for care gap closure-controlling blood pressure.    [facility] Quality Team
